# Patient Record
Sex: Female | Born: 1945 | Hispanic: No | Smoking: Never smoker
Health system: Southern US, Community
[De-identification: ages and names within clinical notes are randomized; demographics above are authoritative.]

## PROBLEM LIST (undated history)

## (undated) DIAGNOSIS — C801 Malignant (primary) neoplasm, unspecified: Secondary | ICD-10-CM

## (undated) DIAGNOSIS — IMO0001 Reserved for inherently not codable concepts without codable children: Secondary | ICD-10-CM

## (undated) DIAGNOSIS — N289 Disorder of kidney and ureter, unspecified: Secondary | ICD-10-CM

## (undated) DIAGNOSIS — F329 Major depressive disorder, single episode, unspecified: Secondary | ICD-10-CM

## (undated) DIAGNOSIS — B1920 Unspecified viral hepatitis C without hepatic coma: Secondary | ICD-10-CM

## (undated) DIAGNOSIS — IMO0002 Reserved for concepts with insufficient information to code with codable children: Secondary | ICD-10-CM

## (undated) DIAGNOSIS — C229 Malignant neoplasm of liver, not specified as primary or secondary: Secondary | ICD-10-CM

## (undated) DIAGNOSIS — K7460 Unspecified cirrhosis of liver: Secondary | ICD-10-CM

## (undated) DIAGNOSIS — Z531 Procedure and treatment not carried out because of patient's decision for reasons of belief and group pressure: Secondary | ICD-10-CM

## (undated) DIAGNOSIS — I10 Essential (primary) hypertension: Secondary | ICD-10-CM

## (undated) DIAGNOSIS — M1990 Unspecified osteoarthritis, unspecified site: Secondary | ICD-10-CM

## (undated) HISTORY — PX: ABDOMINAL HYSTERECTOMY: SHX81

## (undated) HISTORY — DX: Major depressive disorder, single episode, unspecified: F32.9

## (undated) HISTORY — PX: TONSILECTOMY/ADENOIDECTOMY WITH MYRINGOTOMY: SHX6125

## (undated) HISTORY — DX: Disorder of kidney and ureter, unspecified: N28.9

## (undated) HISTORY — DX: Unspecified osteoarthritis, unspecified site: M19.90

## (undated) HISTORY — PX: KNEE ARTHROSCOPY: SUR90

## (undated) HISTORY — PX: KNEE SURGERY: SHX244

## (undated) HISTORY — PX: PARTIAL HYSTERECTOMY: SHX80

## (undated) HISTORY — DX: Unspecified cirrhosis of liver: K74.60

---

## 2006-05-26 ENCOUNTER — Encounter: Admission: RE | Admit: 2006-05-26 | Discharge: 2006-05-26 | Payer: Self-pay | Admitting: Cardiology

## 2006-05-31 ENCOUNTER — Encounter (HOSPITAL_COMMUNITY): Admission: RE | Admit: 2006-05-31 | Discharge: 2006-08-25 | Payer: Self-pay | Admitting: Cardiology

## 2006-09-15 ENCOUNTER — Encounter: Admission: RE | Admit: 2006-09-15 | Discharge: 2006-09-15 | Payer: Self-pay | Admitting: Cardiology

## 2012-01-22 DIAGNOSIS — R209 Unspecified disturbances of skin sensation: Secondary | ICD-10-CM | POA: Diagnosis not present

## 2012-01-22 DIAGNOSIS — D2330 Other benign neoplasm of skin of unspecified part of face: Secondary | ICD-10-CM | POA: Diagnosis not present

## 2012-02-04 DIAGNOSIS — L0291 Cutaneous abscess, unspecified: Secondary | ICD-10-CM | POA: Diagnosis not present

## 2012-02-04 DIAGNOSIS — L0390 Cellulitis, unspecified: Secondary | ICD-10-CM | POA: Diagnosis not present

## 2012-02-23 DIAGNOSIS — H269 Unspecified cataract: Secondary | ICD-10-CM | POA: Diagnosis not present

## 2012-02-23 DIAGNOSIS — I10 Essential (primary) hypertension: Secondary | ICD-10-CM | POA: Diagnosis not present

## 2012-03-08 DIAGNOSIS — F321 Major depressive disorder, single episode, moderate: Secondary | ICD-10-CM | POA: Diagnosis not present

## 2012-05-09 DIAGNOSIS — M810 Age-related osteoporosis without current pathological fracture: Secondary | ICD-10-CM | POA: Diagnosis not present

## 2012-05-09 DIAGNOSIS — I10 Essential (primary) hypertension: Secondary | ICD-10-CM | POA: Diagnosis not present

## 2012-05-09 DIAGNOSIS — A65 Nonvenereal syphilis: Secondary | ICD-10-CM | POA: Diagnosis not present

## 2012-05-12 DIAGNOSIS — K769 Liver disease, unspecified: Secondary | ICD-10-CM | POA: Diagnosis not present

## 2012-05-12 DIAGNOSIS — D649 Anemia, unspecified: Secondary | ICD-10-CM | POA: Diagnosis not present

## 2012-05-12 DIAGNOSIS — I10 Essential (primary) hypertension: Secondary | ICD-10-CM | POA: Diagnosis not present

## 2012-05-12 DIAGNOSIS — D689 Coagulation defect, unspecified: Secondary | ICD-10-CM | POA: Diagnosis not present

## 2012-05-19 DIAGNOSIS — B351 Tinea unguium: Secondary | ICD-10-CM | POA: Diagnosis not present

## 2012-05-19 DIAGNOSIS — L03039 Cellulitis of unspecified toe: Secondary | ICD-10-CM | POA: Diagnosis not present

## 2012-05-19 DIAGNOSIS — L608 Other nail disorders: Secondary | ICD-10-CM | POA: Diagnosis not present

## 2012-07-01 DIAGNOSIS — J029 Acute pharyngitis, unspecified: Secondary | ICD-10-CM | POA: Diagnosis not present

## 2012-07-22 DIAGNOSIS — K759 Inflammatory liver disease, unspecified: Secondary | ICD-10-CM | POA: Diagnosis not present

## 2012-07-22 DIAGNOSIS — K279 Peptic ulcer, site unspecified, unspecified as acute or chronic, without hemorrhage or perforation: Secondary | ICD-10-CM | POA: Diagnosis not present

## 2012-07-22 DIAGNOSIS — R042 Hemoptysis: Secondary | ICD-10-CM | POA: Diagnosis not present

## 2012-07-22 DIAGNOSIS — I10 Essential (primary) hypertension: Secondary | ICD-10-CM | POA: Diagnosis not present

## 2012-08-05 DIAGNOSIS — J209 Acute bronchitis, unspecified: Secondary | ICD-10-CM | POA: Diagnosis not present

## 2012-08-05 DIAGNOSIS — K279 Peptic ulcer, site unspecified, unspecified as acute or chronic, without hemorrhage or perforation: Secondary | ICD-10-CM | POA: Diagnosis not present

## 2012-08-05 DIAGNOSIS — I10 Essential (primary) hypertension: Secondary | ICD-10-CM | POA: Diagnosis not present

## 2012-08-05 DIAGNOSIS — K2970 Gastritis, unspecified, without bleeding: Secondary | ICD-10-CM | POA: Diagnosis not present

## 2012-10-06 DIAGNOSIS — I10 Essential (primary) hypertension: Secondary | ICD-10-CM | POA: Diagnosis not present

## 2012-10-06 DIAGNOSIS — B1920 Unspecified viral hepatitis C without hepatic coma: Secondary | ICD-10-CM | POA: Diagnosis not present

## 2012-10-06 DIAGNOSIS — K769 Liver disease, unspecified: Secondary | ICD-10-CM | POA: Diagnosis not present

## 2012-10-06 DIAGNOSIS — IMO0002 Reserved for concepts with insufficient information to code with codable children: Secondary | ICD-10-CM | POA: Diagnosis not present

## 2012-10-10 DIAGNOSIS — M25569 Pain in unspecified knee: Secondary | ICD-10-CM | POA: Diagnosis not present

## 2012-10-10 DIAGNOSIS — IMO0002 Reserved for concepts with insufficient information to code with codable children: Secondary | ICD-10-CM | POA: Diagnosis not present

## 2012-10-13 DIAGNOSIS — K259 Gastric ulcer, unspecified as acute or chronic, without hemorrhage or perforation: Secondary | ICD-10-CM | POA: Diagnosis not present

## 2012-10-14 DIAGNOSIS — M5030 Other cervical disc degeneration, unspecified cervical region: Secondary | ICD-10-CM | POA: Diagnosis not present

## 2012-10-14 DIAGNOSIS — M5137 Other intervertebral disc degeneration, lumbosacral region: Secondary | ICD-10-CM | POA: Diagnosis not present

## 2012-10-14 DIAGNOSIS — M9981 Other biomechanical lesions of cervical region: Secondary | ICD-10-CM | POA: Diagnosis not present

## 2012-10-14 DIAGNOSIS — M999 Biomechanical lesion, unspecified: Secondary | ICD-10-CM | POA: Diagnosis not present

## 2012-10-14 DIAGNOSIS — F321 Major depressive disorder, single episode, moderate: Secondary | ICD-10-CM | POA: Diagnosis not present

## 2012-10-14 DIAGNOSIS — M4600 Spinal enthesopathy, site unspecified: Secondary | ICD-10-CM | POA: Diagnosis not present

## 2012-10-18 DIAGNOSIS — M545 Low back pain: Secondary | ICD-10-CM | POA: Diagnosis not present

## 2012-10-26 DIAGNOSIS — I10 Essential (primary) hypertension: Secondary | ICD-10-CM | POA: Diagnosis not present

## 2012-10-26 DIAGNOSIS — K259 Gastric ulcer, unspecified as acute or chronic, without hemorrhage or perforation: Secondary | ICD-10-CM | POA: Diagnosis not present

## 2012-10-26 DIAGNOSIS — I8500 Esophageal varices without bleeding: Secondary | ICD-10-CM | POA: Diagnosis not present

## 2012-10-26 DIAGNOSIS — A048 Other specified bacterial intestinal infections: Secondary | ICD-10-CM | POA: Diagnosis not present

## 2012-10-26 DIAGNOSIS — D128 Benign neoplasm of rectum: Secondary | ICD-10-CM | POA: Diagnosis not present

## 2012-10-26 DIAGNOSIS — Z1211 Encounter for screening for malignant neoplasm of colon: Secondary | ICD-10-CM | POA: Diagnosis not present

## 2012-10-26 DIAGNOSIS — Z91013 Allergy to seafood: Secondary | ICD-10-CM | POA: Diagnosis not present

## 2012-10-26 DIAGNOSIS — D126 Benign neoplasm of colon, unspecified: Secondary | ICD-10-CM | POA: Diagnosis not present

## 2012-10-26 DIAGNOSIS — K2940 Chronic atrophic gastritis without bleeding: Secondary | ICD-10-CM | POA: Diagnosis not present

## 2012-10-26 DIAGNOSIS — K5730 Diverticulosis of large intestine without perforation or abscess without bleeding: Secondary | ICD-10-CM | POA: Diagnosis not present

## 2012-10-26 DIAGNOSIS — I868 Varicose veins of other specified sites: Secondary | ICD-10-CM | POA: Diagnosis not present

## 2012-10-26 DIAGNOSIS — L905 Scar conditions and fibrosis of skin: Secondary | ICD-10-CM | POA: Diagnosis not present

## 2012-11-02 DIAGNOSIS — M4600 Spinal enthesopathy, site unspecified: Secondary | ICD-10-CM | POA: Diagnosis not present

## 2012-11-02 DIAGNOSIS — M5030 Other cervical disc degeneration, unspecified cervical region: Secondary | ICD-10-CM | POA: Diagnosis not present

## 2012-11-02 DIAGNOSIS — M9981 Other biomechanical lesions of cervical region: Secondary | ICD-10-CM | POA: Diagnosis not present

## 2012-11-02 DIAGNOSIS — M5137 Other intervertebral disc degeneration, lumbosacral region: Secondary | ICD-10-CM | POA: Diagnosis not present

## 2012-11-02 DIAGNOSIS — M999 Biomechanical lesion, unspecified: Secondary | ICD-10-CM | POA: Diagnosis not present

## 2012-11-04 DIAGNOSIS — M999 Biomechanical lesion, unspecified: Secondary | ICD-10-CM | POA: Diagnosis not present

## 2012-11-04 DIAGNOSIS — M4600 Spinal enthesopathy, site unspecified: Secondary | ICD-10-CM | POA: Diagnosis not present

## 2012-11-04 DIAGNOSIS — M5030 Other cervical disc degeneration, unspecified cervical region: Secondary | ICD-10-CM | POA: Diagnosis not present

## 2012-11-04 DIAGNOSIS — M5137 Other intervertebral disc degeneration, lumbosacral region: Secondary | ICD-10-CM | POA: Diagnosis not present

## 2012-11-04 DIAGNOSIS — M9981 Other biomechanical lesions of cervical region: Secondary | ICD-10-CM | POA: Diagnosis not present

## 2012-11-07 DIAGNOSIS — M5030 Other cervical disc degeneration, unspecified cervical region: Secondary | ICD-10-CM | POA: Diagnosis not present

## 2012-11-07 DIAGNOSIS — M999 Biomechanical lesion, unspecified: Secondary | ICD-10-CM | POA: Diagnosis not present

## 2012-11-07 DIAGNOSIS — M5137 Other intervertebral disc degeneration, lumbosacral region: Secondary | ICD-10-CM | POA: Diagnosis not present

## 2012-11-07 DIAGNOSIS — M9981 Other biomechanical lesions of cervical region: Secondary | ICD-10-CM | POA: Diagnosis not present

## 2012-11-07 DIAGNOSIS — M4600 Spinal enthesopathy, site unspecified: Secondary | ICD-10-CM | POA: Diagnosis not present

## 2012-11-08 DIAGNOSIS — M4600 Spinal enthesopathy, site unspecified: Secondary | ICD-10-CM | POA: Diagnosis not present

## 2012-11-08 DIAGNOSIS — M5030 Other cervical disc degeneration, unspecified cervical region: Secondary | ICD-10-CM | POA: Diagnosis not present

## 2012-11-08 DIAGNOSIS — M999 Biomechanical lesion, unspecified: Secondary | ICD-10-CM | POA: Diagnosis not present

## 2012-11-08 DIAGNOSIS — M9981 Other biomechanical lesions of cervical region: Secondary | ICD-10-CM | POA: Diagnosis not present

## 2012-11-08 DIAGNOSIS — M5137 Other intervertebral disc degeneration, lumbosacral region: Secondary | ICD-10-CM | POA: Diagnosis not present

## 2012-11-18 DIAGNOSIS — M999 Biomechanical lesion, unspecified: Secondary | ICD-10-CM | POA: Diagnosis not present

## 2012-11-18 DIAGNOSIS — G544 Lumbosacral root disorders, not elsewhere classified: Secondary | ICD-10-CM | POA: Diagnosis not present

## 2012-11-18 DIAGNOSIS — M4600 Spinal enthesopathy, site unspecified: Secondary | ICD-10-CM | POA: Diagnosis not present

## 2012-11-18 DIAGNOSIS — M5137 Other intervertebral disc degeneration, lumbosacral region: Secondary | ICD-10-CM | POA: Diagnosis not present

## 2012-11-22 DIAGNOSIS — G544 Lumbosacral root disorders, not elsewhere classified: Secondary | ICD-10-CM | POA: Diagnosis not present

## 2012-11-22 DIAGNOSIS — M4600 Spinal enthesopathy, site unspecified: Secondary | ICD-10-CM | POA: Diagnosis not present

## 2012-11-22 DIAGNOSIS — M999 Biomechanical lesion, unspecified: Secondary | ICD-10-CM | POA: Diagnosis not present

## 2012-11-22 DIAGNOSIS — M5137 Other intervertebral disc degeneration, lumbosacral region: Secondary | ICD-10-CM | POA: Diagnosis not present

## 2012-11-29 DIAGNOSIS — G544 Lumbosacral root disorders, not elsewhere classified: Secondary | ICD-10-CM | POA: Diagnosis not present

## 2012-11-29 DIAGNOSIS — M5137 Other intervertebral disc degeneration, lumbosacral region: Secondary | ICD-10-CM | POA: Diagnosis not present

## 2012-11-29 DIAGNOSIS — M4600 Spinal enthesopathy, site unspecified: Secondary | ICD-10-CM | POA: Diagnosis not present

## 2012-11-29 DIAGNOSIS — M999 Biomechanical lesion, unspecified: Secondary | ICD-10-CM | POA: Diagnosis not present

## 2012-12-02 DIAGNOSIS — M5137 Other intervertebral disc degeneration, lumbosacral region: Secondary | ICD-10-CM | POA: Diagnosis not present

## 2012-12-02 DIAGNOSIS — M999 Biomechanical lesion, unspecified: Secondary | ICD-10-CM | POA: Diagnosis not present

## 2012-12-02 DIAGNOSIS — G544 Lumbosacral root disorders, not elsewhere classified: Secondary | ICD-10-CM | POA: Diagnosis not present

## 2012-12-02 DIAGNOSIS — M4600 Spinal enthesopathy, site unspecified: Secondary | ICD-10-CM | POA: Diagnosis not present

## 2013-01-10 DIAGNOSIS — G544 Lumbosacral root disorders, not elsewhere classified: Secondary | ICD-10-CM | POA: Diagnosis not present

## 2013-01-10 DIAGNOSIS — M4600 Spinal enthesopathy, site unspecified: Secondary | ICD-10-CM | POA: Diagnosis not present

## 2013-01-10 DIAGNOSIS — M5137 Other intervertebral disc degeneration, lumbosacral region: Secondary | ICD-10-CM | POA: Diagnosis not present

## 2013-01-10 DIAGNOSIS — M999 Biomechanical lesion, unspecified: Secondary | ICD-10-CM | POA: Diagnosis not present

## 2013-01-23 DIAGNOSIS — M4600 Spinal enthesopathy, site unspecified: Secondary | ICD-10-CM | POA: Diagnosis not present

## 2013-01-23 DIAGNOSIS — M5137 Other intervertebral disc degeneration, lumbosacral region: Secondary | ICD-10-CM | POA: Diagnosis not present

## 2013-01-23 DIAGNOSIS — M999 Biomechanical lesion, unspecified: Secondary | ICD-10-CM | POA: Diagnosis not present

## 2013-01-23 DIAGNOSIS — G544 Lumbosacral root disorders, not elsewhere classified: Secondary | ICD-10-CM | POA: Diagnosis not present

## 2013-01-31 DIAGNOSIS — M5137 Other intervertebral disc degeneration, lumbosacral region: Secondary | ICD-10-CM | POA: Diagnosis not present

## 2013-01-31 DIAGNOSIS — M4600 Spinal enthesopathy, site unspecified: Secondary | ICD-10-CM | POA: Diagnosis not present

## 2013-01-31 DIAGNOSIS — M999 Biomechanical lesion, unspecified: Secondary | ICD-10-CM | POA: Diagnosis not present

## 2013-01-31 DIAGNOSIS — G544 Lumbosacral root disorders, not elsewhere classified: Secondary | ICD-10-CM | POA: Diagnosis not present

## 2013-02-01 DIAGNOSIS — Z79899 Other long term (current) drug therapy: Secondary | ICD-10-CM | POA: Diagnosis not present

## 2013-02-01 DIAGNOSIS — F339 Major depressive disorder, recurrent, unspecified: Secondary | ICD-10-CM | POA: Diagnosis not present

## 2013-02-01 DIAGNOSIS — B1710 Acute hepatitis C without hepatic coma: Secondary | ICD-10-CM | POA: Diagnosis not present

## 2013-02-01 DIAGNOSIS — M25569 Pain in unspecified knee: Secondary | ICD-10-CM | POA: Diagnosis not present

## 2013-02-01 DIAGNOSIS — I10 Essential (primary) hypertension: Secondary | ICD-10-CM | POA: Diagnosis not present

## 2013-02-02 DIAGNOSIS — M546 Pain in thoracic spine: Secondary | ICD-10-CM | POA: Diagnosis not present

## 2013-02-02 DIAGNOSIS — Z79899 Other long term (current) drug therapy: Secondary | ICD-10-CM | POA: Diagnosis not present

## 2013-02-10 DIAGNOSIS — F321 Major depressive disorder, single episode, moderate: Secondary | ICD-10-CM | POA: Diagnosis not present

## 2013-02-21 DIAGNOSIS — K7460 Unspecified cirrhosis of liver: Secondary | ICD-10-CM | POA: Diagnosis not present

## 2013-02-21 DIAGNOSIS — B182 Chronic viral hepatitis C: Secondary | ICD-10-CM | POA: Diagnosis not present

## 2013-02-22 DIAGNOSIS — M999 Biomechanical lesion, unspecified: Secondary | ICD-10-CM | POA: Diagnosis not present

## 2013-02-22 DIAGNOSIS — G544 Lumbosacral root disorders, not elsewhere classified: Secondary | ICD-10-CM | POA: Diagnosis not present

## 2013-02-22 DIAGNOSIS — M5137 Other intervertebral disc degeneration, lumbosacral region: Secondary | ICD-10-CM | POA: Diagnosis not present

## 2013-02-22 DIAGNOSIS — M4600 Spinal enthesopathy, site unspecified: Secondary | ICD-10-CM | POA: Diagnosis not present

## 2013-02-23 DIAGNOSIS — B182 Chronic viral hepatitis C: Secondary | ICD-10-CM | POA: Diagnosis not present

## 2013-02-23 DIAGNOSIS — I8500 Esophageal varices without bleeding: Secondary | ICD-10-CM | POA: Diagnosis not present

## 2013-02-27 DIAGNOSIS — M5137 Other intervertebral disc degeneration, lumbosacral region: Secondary | ICD-10-CM | POA: Diagnosis not present

## 2013-02-27 DIAGNOSIS — M4600 Spinal enthesopathy, site unspecified: Secondary | ICD-10-CM | POA: Diagnosis not present

## 2013-02-27 DIAGNOSIS — M999 Biomechanical lesion, unspecified: Secondary | ICD-10-CM | POA: Diagnosis not present

## 2013-02-27 DIAGNOSIS — G544 Lumbosacral root disorders, not elsewhere classified: Secondary | ICD-10-CM | POA: Diagnosis not present

## 2013-03-24 DIAGNOSIS — B182 Chronic viral hepatitis C: Secondary | ICD-10-CM | POA: Diagnosis not present

## 2013-05-16 DIAGNOSIS — K279 Peptic ulcer, site unspecified, unspecified as acute or chronic, without hemorrhage or perforation: Secondary | ICD-10-CM | POA: Diagnosis not present

## 2013-05-16 DIAGNOSIS — R1013 Epigastric pain: Secondary | ICD-10-CM | POA: Diagnosis not present

## 2013-05-16 DIAGNOSIS — I209 Angina pectoris, unspecified: Secondary | ICD-10-CM | POA: Diagnosis not present

## 2013-05-16 DIAGNOSIS — I10 Essential (primary) hypertension: Secondary | ICD-10-CM | POA: Diagnosis not present

## 2013-05-16 DIAGNOSIS — D696 Thrombocytopenia, unspecified: Secondary | ICD-10-CM | POA: Diagnosis not present

## 2013-05-16 DIAGNOSIS — K769 Liver disease, unspecified: Secondary | ICD-10-CM | POA: Diagnosis not present

## 2013-05-16 DIAGNOSIS — K2990 Gastroduodenitis, unspecified, without bleeding: Secondary | ICD-10-CM | POA: Diagnosis not present

## 2013-05-16 DIAGNOSIS — K219 Gastro-esophageal reflux disease without esophagitis: Secondary | ICD-10-CM | POA: Diagnosis not present

## 2013-05-16 DIAGNOSIS — K2970 Gastritis, unspecified, without bleeding: Secondary | ICD-10-CM | POA: Diagnosis not present

## 2013-06-15 DIAGNOSIS — R072 Precordial pain: Secondary | ICD-10-CM | POA: Diagnosis not present

## 2013-06-15 DIAGNOSIS — I10 Essential (primary) hypertension: Secondary | ICD-10-CM | POA: Diagnosis not present

## 2013-06-15 DIAGNOSIS — F321 Major depressive disorder, single episode, moderate: Secondary | ICD-10-CM | POA: Diagnosis not present

## 2013-06-23 DIAGNOSIS — I8501 Esophageal varices with bleeding: Secondary | ICD-10-CM | POA: Diagnosis not present

## 2013-07-21 DIAGNOSIS — I10 Essential (primary) hypertension: Secondary | ICD-10-CM | POA: Diagnosis not present

## 2013-08-25 DIAGNOSIS — M999 Biomechanical lesion, unspecified: Secondary | ICD-10-CM | POA: Diagnosis not present

## 2013-08-25 DIAGNOSIS — M5137 Other intervertebral disc degeneration, lumbosacral region: Secondary | ICD-10-CM | POA: Diagnosis not present

## 2013-08-25 DIAGNOSIS — M5030 Other cervical disc degeneration, unspecified cervical region: Secondary | ICD-10-CM | POA: Diagnosis not present

## 2013-08-25 DIAGNOSIS — F321 Major depressive disorder, single episode, moderate: Secondary | ICD-10-CM | POA: Diagnosis not present

## 2013-08-25 DIAGNOSIS — G544 Lumbosacral root disorders, not elsewhere classified: Secondary | ICD-10-CM | POA: Diagnosis not present

## 2013-08-25 DIAGNOSIS — M4600 Spinal enthesopathy, site unspecified: Secondary | ICD-10-CM | POA: Diagnosis not present

## 2013-08-25 DIAGNOSIS — M9981 Other biomechanical lesions of cervical region: Secondary | ICD-10-CM | POA: Diagnosis not present

## 2013-09-05 DIAGNOSIS — M5030 Other cervical disc degeneration, unspecified cervical region: Secondary | ICD-10-CM | POA: Diagnosis not present

## 2013-09-05 DIAGNOSIS — M9981 Other biomechanical lesions of cervical region: Secondary | ICD-10-CM | POA: Diagnosis not present

## 2013-09-05 DIAGNOSIS — M999 Biomechanical lesion, unspecified: Secondary | ICD-10-CM | POA: Diagnosis not present

## 2013-09-05 DIAGNOSIS — M4600 Spinal enthesopathy, site unspecified: Secondary | ICD-10-CM | POA: Diagnosis not present

## 2013-09-05 DIAGNOSIS — M5137 Other intervertebral disc degeneration, lumbosacral region: Secondary | ICD-10-CM | POA: Diagnosis not present

## 2013-09-05 DIAGNOSIS — G544 Lumbosacral root disorders, not elsewhere classified: Secondary | ICD-10-CM | POA: Diagnosis not present

## 2013-09-13 DIAGNOSIS — M9981 Other biomechanical lesions of cervical region: Secondary | ICD-10-CM | POA: Diagnosis not present

## 2013-09-13 DIAGNOSIS — M999 Biomechanical lesion, unspecified: Secondary | ICD-10-CM | POA: Diagnosis not present

## 2013-09-13 DIAGNOSIS — M4600 Spinal enthesopathy, site unspecified: Secondary | ICD-10-CM | POA: Diagnosis not present

## 2013-09-13 DIAGNOSIS — M5030 Other cervical disc degeneration, unspecified cervical region: Secondary | ICD-10-CM | POA: Diagnosis not present

## 2013-09-13 DIAGNOSIS — G544 Lumbosacral root disorders, not elsewhere classified: Secondary | ICD-10-CM | POA: Diagnosis not present

## 2013-09-13 DIAGNOSIS — M5137 Other intervertebral disc degeneration, lumbosacral region: Secondary | ICD-10-CM | POA: Diagnosis not present

## 2013-10-07 DIAGNOSIS — I10 Essential (primary) hypertension: Secondary | ICD-10-CM | POA: Diagnosis not present

## 2013-10-07 DIAGNOSIS — M539 Dorsopathy, unspecified: Secondary | ICD-10-CM | POA: Diagnosis not present

## 2013-10-07 DIAGNOSIS — J019 Acute sinusitis, unspecified: Secondary | ICD-10-CM | POA: Diagnosis not present

## 2013-10-16 DIAGNOSIS — J018 Other acute sinusitis: Secondary | ICD-10-CM | POA: Diagnosis not present

## 2013-11-16 DIAGNOSIS — L98499 Non-pressure chronic ulcer of skin of other sites with unspecified severity: Secondary | ICD-10-CM | POA: Diagnosis not present

## 2013-11-16 DIAGNOSIS — J019 Acute sinusitis, unspecified: Secondary | ICD-10-CM | POA: Diagnosis not present

## 2013-11-16 DIAGNOSIS — R0789 Other chest pain: Secondary | ICD-10-CM | POA: Diagnosis not present

## 2013-11-16 DIAGNOSIS — I10 Essential (primary) hypertension: Secondary | ICD-10-CM | POA: Diagnosis not present

## 2014-01-22 DIAGNOSIS — J018 Other acute sinusitis: Secondary | ICD-10-CM | POA: Diagnosis not present

## 2014-03-07 DIAGNOSIS — M1990 Unspecified osteoarthritis, unspecified site: Secondary | ICD-10-CM | POA: Diagnosis not present

## 2014-03-07 DIAGNOSIS — R609 Edema, unspecified: Secondary | ICD-10-CM | POA: Diagnosis not present

## 2014-03-07 DIAGNOSIS — R002 Palpitations: Secondary | ICD-10-CM | POA: Diagnosis not present

## 2014-03-07 DIAGNOSIS — R5383 Other fatigue: Secondary | ICD-10-CM | POA: Diagnosis not present

## 2014-03-07 DIAGNOSIS — E785 Hyperlipidemia, unspecified: Secondary | ICD-10-CM | POA: Diagnosis not present

## 2014-03-07 DIAGNOSIS — R063 Periodic breathing: Secondary | ICD-10-CM | POA: Diagnosis not present

## 2014-03-07 DIAGNOSIS — R5381 Other malaise: Secondary | ICD-10-CM | POA: Diagnosis not present

## 2014-03-07 DIAGNOSIS — I10 Essential (primary) hypertension: Secondary | ICD-10-CM | POA: Diagnosis not present

## 2014-03-12 ENCOUNTER — Other Ambulatory Visit (HOSPITAL_COMMUNITY): Payer: Self-pay | Admitting: Cardiology

## 2014-03-12 DIAGNOSIS — R0600 Dyspnea, unspecified: Secondary | ICD-10-CM

## 2014-03-22 ENCOUNTER — Ambulatory Visit (HOSPITAL_COMMUNITY)
Admission: RE | Admit: 2014-03-22 | Discharge: 2014-03-22 | Disposition: A | Discharge status: Home / Self Care | Payer: Medicare Other | Source: Ambulatory Visit | Attending: Cardiology | Admitting: Cardiology

## 2014-03-22 DIAGNOSIS — R0609 Other forms of dyspnea: Secondary | ICD-10-CM | POA: Diagnosis not present

## 2014-03-22 DIAGNOSIS — R0989 Other specified symptoms and signs involving the circulatory and respiratory systems: Secondary | ICD-10-CM | POA: Diagnosis not present

## 2014-03-22 DIAGNOSIS — R0600 Dyspnea, unspecified: Secondary | ICD-10-CM

## 2014-03-22 NOTE — Progress Notes (Signed)
  Echocardiogram 2D Echocardiogram has been performed.  Maybee 03/22/2014, 2:37 PM

## 2014-04-03 ENCOUNTER — Emergency Department (HOSPITAL_COMMUNITY): Payer: Medicare Other

## 2014-04-03 ENCOUNTER — Emergency Department (HOSPITAL_COMMUNITY)
Admission: EM | Admit: 2014-04-03 | Discharge: 2014-04-03 | Disposition: A | Discharge status: Home / Self Care | Payer: Medicare Other | Attending: Emergency Medicine | Admitting: Emergency Medicine

## 2014-04-03 ENCOUNTER — Encounter (HOSPITAL_COMMUNITY): Payer: Self-pay | Admitting: Emergency Medicine

## 2014-04-03 DIAGNOSIS — R519 Headache, unspecified: Secondary | ICD-10-CM

## 2014-04-03 DIAGNOSIS — Z8619 Personal history of other infectious and parasitic diseases: Secondary | ICD-10-CM | POA: Insufficient documentation

## 2014-04-03 DIAGNOSIS — I10 Essential (primary) hypertension: Secondary | ICD-10-CM | POA: Diagnosis not present

## 2014-04-03 DIAGNOSIS — L98499 Non-pressure chronic ulcer of skin of other sites with unspecified severity: Secondary | ICD-10-CM | POA: Diagnosis not present

## 2014-04-03 DIAGNOSIS — R51 Headache: Secondary | ICD-10-CM | POA: Insufficient documentation

## 2014-04-03 DIAGNOSIS — Z79899 Other long term (current) drug therapy: Secondary | ICD-10-CM | POA: Insufficient documentation

## 2014-04-03 HISTORY — DX: Reserved for concepts with insufficient information to code with codable children: IMO0002

## 2014-04-03 HISTORY — DX: Unspecified viral hepatitis C without hepatic coma: B19.20

## 2014-04-03 HISTORY — DX: Essential (primary) hypertension: I10

## 2014-04-03 LAB — CBC WITH DIFFERENTIAL/PLATELET
BASOS PCT: 0 % (ref 0–1)
Basophils Absolute: 0.1 10*3/uL (ref 0.0–0.1)
EOS ABS: 0.2 10*3/uL (ref 0.0–0.7)
Eosinophils Relative: 1 % (ref 0–5)
HCT: 37.8 % (ref 36.0–46.0)
HEMOGLOBIN: 12.5 g/dL (ref 12.0–15.0)
Lymphocytes Relative: 31 % (ref 12–46)
Lymphs Abs: 4.3 10*3/uL — ABNORMAL HIGH (ref 0.7–4.0)
MCH: 28.2 pg (ref 26.0–34.0)
MCHC: 33.1 g/dL (ref 30.0–36.0)
MCV: 85.3 fL (ref 78.0–100.0)
MONOS PCT: 13 % — AB (ref 3–12)
Monocytes Absolute: 1.9 10*3/uL — ABNORMAL HIGH (ref 0.1–1.0)
Neutro Abs: 7.6 10*3/uL (ref 1.7–7.7)
Neutrophils Relative %: 54 % (ref 43–77)
PLATELETS: 105 10*3/uL — AB (ref 150–400)
RBC: 4.43 MIL/uL (ref 3.87–5.11)
RDW: 17.3 % — ABNORMAL HIGH (ref 11.5–15.5)
WBC: 14.1 10*3/uL — ABNORMAL HIGH (ref 4.0–10.5)

## 2014-04-03 LAB — I-STAT CHEM 8, ED
BUN: 10 mg/dL (ref 6–23)
Calcium, Ion: 1.2 mmol/L (ref 1.13–1.30)
Chloride: 104 mEq/L (ref 96–112)
Creatinine, Ser: 0.9 mg/dL (ref 0.50–1.10)
Glucose, Bld: 118 mg/dL — ABNORMAL HIGH (ref 70–99)
HEMATOCRIT: 41 % (ref 36.0–46.0)
HEMOGLOBIN: 13.9 g/dL (ref 12.0–15.0)
Potassium: 4.1 mEq/L (ref 3.7–5.3)
Sodium: 141 mEq/L (ref 137–147)
TCO2: 24 mmol/L (ref 0–100)

## 2014-04-03 LAB — SEDIMENTATION RATE: Sed Rate: 33 mm/hr — ABNORMAL HIGH (ref 0–22)

## 2014-04-03 MED ORDER — OXYCODONE HCL 5 MG PO TABS: 5.0000 mg | ORAL_TABLET | ORAL | Status: DC | PRN

## 2014-04-03 MED ORDER — ACETAMINOPHEN 325 MG PO TABS
650.0000 mg | ORAL_TABLET | Freq: Once | ORAL | Status: AC
  Administered 2014-04-03: 650 mg via ORAL
  Filled 2014-04-03: qty 2

## 2014-04-03 MED ORDER — CALCIUM CARBONATE ANTACID 500 MG PO CHEW
1.0000 | CHEWABLE_TABLET | Freq: Once | ORAL | Status: AC
  Administered 2014-04-03: 200 mg via ORAL
  Filled 2014-04-03: qty 1

## 2014-04-03 MED ORDER — ONDANSETRON 4 MG PO TBDP
4.0000 mg | ORAL_TABLET | Freq: Once | ORAL | Status: AC
  Administered 2014-04-03: 4 mg via ORAL
  Filled 2014-04-03: qty 1

## 2014-04-03 NOTE — ED Notes (Signed)
PT reports headache x 3 days and generalized weakness. No blurred vision or vomiting.

## 2014-04-03 NOTE — ED Provider Notes (Signed)
History is obtained from patient's daughter acting as interpreter. Patient speaks little [place]. A medical interpreter was offered to the patient but she declines Complains of bitemporal headache for approximately 3 days denies generalized weakness. Nothing makes symptoms better or worse she's been treated self with Tylenol. Headache is mild at present nonradiating dull with exacerbations of headache lasting a split second. No visual changes no other complaint. On exam she is alert Glasgow Coma Score 15 gait normal cranial nerves II through XII intact Medical decision making  lancinating lancinating characteristic of pain may be consistent with nerve palsy. Tegretol is contraindicated in this patient with hepatitis C  Orlie Dakin, MD 04/03/14 1551

## 2014-04-03 NOTE — ED Notes (Signed)
MD at bedside. 

## 2014-04-03 NOTE — ED Notes (Signed)
Patient transported to CT 

## 2014-04-03 NOTE — Discharge Instructions (Signed)

## 2014-04-03 NOTE — ED Provider Notes (Signed)
CSN: [id number]     Arrival date & time 04/03/14  1138 History   First MD Initiated Contact with Patient 04/03/14 1249     Chief Complaint  Patient presents with  . Headache     (Consider location/radiation/quality/duration/timing/severity/associated sxs/prior Treatment) HPI Comments: Patient is 68 year old female who speaks spanish and is here with her daughter who is interpreting.  She has significant PMHx for Hepatitis C and HTN.  She presents from her GI doctor with history of 3 days of intermittent sharp stabbing headache which is located at bilateral temples.  She states that the pain comes and goes with treatment with tylenol but they are concerned that she may be having a stroke.  She denies any fever, chills, visual changes, nausea, vomiting, chest pain, focal neurological deficits.  She walks with an unsteady gait with a walker at home at baseline.  Patient is a 68 y.o. female presenting with headaches. The history is provided by the patient and a relative. The history is limited by a language barrier. A language interpreter was used.  Headache Pain location:  R temporal and L temporal Quality:  Sharp Radiates to:  Does not radiate Severity currently:  8/10 Severity at highest:  10/10 Onset quality:  Sudden Duration:  3 days Timing:  Intermittent Progression:  Worsening Chronicity:  New Similar to prior headaches: no   Context: bright light   Context: not activity, not coughing, not defecating, not stress, not exposure to cold air, not intercourse, not loud noise and not straining   Relieved by:  Acetaminophen Worsened by:  Nothing tried Ineffective treatments:  None tried Associated symptoms: weakness   Associated symptoms: no back pain, no blurred vision, no cough, no dizziness, no pain, no facial pain, no focal weakness, no loss of balance, no nausea, no neck pain, no neck stiffness, no photophobia, no sinus pressure, no syncope, no visual change and no vomiting      Past Medical History  Diagnosis Date  . Ulcer   . Hepatitis C   . Hypertension    History reviewed. No pertinent past surgical history. No family history on file. History  Substance Use Topics  . Smoking status: Never Smoker   . Smokeless tobacco: Not on file  . Alcohol Use: No   OB History   Grav Para Term Preterm Abortions TAB SAB Ect Mult Living                 Review of Systems  HENT: Negative for sinus pressure.   Eyes: Negative for blurred vision, photophobia and pain.  Respiratory: Negative for cough.   Cardiovascular: Negative for syncope.  Gastrointestinal: Negative for nausea and vomiting.  Musculoskeletal: Negative for back pain, neck pain and neck stiffness.  Neurological: Positive for headaches. Negative for dizziness, focal weakness and loss of balance.  All other systems reviewed and are negative.     Allergies  Review of patient's allergies indicates no known allergies.  Home Medications   Prior to Admission medications   Medication Sig Start Date End Date Taking? Authorizing Provider  acetaminophen (TYLENOL) 500 MG tablet Take 1,000 mg by mouth every 6 (six) hours as needed for mild pain.   Yes Historical Provider, MD  benazepril (LOTENSIN) 10 MG tablet Take 10 mg by mouth 2 (two) times daily.   Yes Historical Provider, MD  pantoprazole (PROTONIX) 40 MG tablet Take 40 mg by mouth daily.   Yes Historical Provider, MD   BP 126/65  Pulse 75  Temp(Src) 98.5 F (36.9 C) (Oral)  Resp 19  SpO2 96% Physical Exam  Nursing note and vitals reviewed. Constitutional: She is oriented to person, place, and time. She appears well-developed and well-nourished. No distress.  HENT:  Head: Normocephalic and atraumatic.    Right Ear: External ear normal.  Left Ear: External ear normal.  Nose: Nose normal.  Mouth/Throat: Oropharynx is clear and moist. No oropharyngeal exudate.  Mild tenderness to palpation bilateral temples, no swelling or hardness noted,  no bruit  Eyes: Conjunctivae and EOM are normal. Pupils are equal, round, and reactive to light. No scleral icterus.  Neck: Normal range of motion. Neck supple. No rigidity.  Cardiovascular: Normal rate, regular rhythm and normal heart sounds.  Exam reveals no gallop and no friction rub.   No murmur heard. Pulmonary/Chest: Effort normal and breath sounds normal. No respiratory distress. She has no wheezes. She has no rales. She exhibits no tenderness.  Abdominal: Soft. Bowel sounds are normal. She exhibits no distension and no mass. There is no tenderness. There is no rebound and no guarding.  Musculoskeletal: Normal range of motion. She exhibits no edema and no tenderness.  Lymphadenopathy:    She has no cervical adenopathy.  Neurological: She is alert and oriented to person, place, and time. She has normal reflexes. No cranial nerve deficit. She exhibits normal muscle tone. Coordination normal.  Gait normal, no pronator drift, 5/5 strength in all muscle groups, sensation intact  Skin: Skin is warm and dry. No rash noted. No erythema. No pallor.  Psychiatric: She has a normal mood and affect. Her behavior is normal. Judgment and thought content normal.    ED Course  Procedures (including critical care time) Labs Review Labs Reviewed  CBC WITH DIFFERENTIAL - Abnormal; Notable for the following:    WBC 14.1 (*)    RDW 17.3 (*)    Platelets 105 (*)    Lymphs Abs 4.3 (*)    Monocytes Relative 13 (*)    Monocytes Absolute 1.9 (*)    All other components within normal limits  SEDIMENTATION RATE - Abnormal; Notable for the following:    Sed Rate 33 (*)    All other components within normal limits  I-STAT CHEM 8, ED - Abnormal; Notable for the following:    Glucose, Bld 118 (*)    All other components within normal limits    Imaging Review Ct Head Wo Contrast  04/03/2014   CLINICAL DATA:  Headache  EXAM: CT HEAD WITHOUT CONTRAST  TECHNIQUE: Contiguous axial images were obtained from  the base of the skull through the vertex without intravenous contrast.  COMPARISON:  None.  FINDINGS: Ventricle size is normal. Normal cerebral volume. Negative for acute infarct, hemorrhage, or mass. No acute skull abnormality.  IMPRESSION: Negative   Electronically Signed   By: Franchot Gallo M.D.   On: 04/03/2014 15:03     EKG Interpretation None      MDM   Headache  Patient here with episodic temporal headaches for the past 3 days, relieved with tylenol, CT negative and remainder of labs here are normal.  Sedrate is mildly elevated but not enough to suggest temporal arteritis.  Patient started on short course of pain medication and will follow up with PCP, Dr. Sharolyn Douglas C. Joelyn Oms, PA-C 04/03/14 1610

## 2014-04-03 NOTE — ED Provider Notes (Signed)
Medical screening examination/treatment/procedure(s) were conducted as a shared visit with non-physician practitioner(s) and myself.  I personally evaluated the patient during the encounter.   EKG Interpretation None       Orlie Dakin, MD 04/03/14 2040

## 2014-04-03 NOTE — ED Notes (Signed)
Pt refused the zofran, charted in error

## 2014-04-05 ENCOUNTER — Other Ambulatory Visit: Payer: Self-pay | Admitting: Nurse Practitioner

## 2014-04-05 DIAGNOSIS — C220 Liver cell carcinoma: Secondary | ICD-10-CM

## 2014-04-09 DIAGNOSIS — B182 Chronic viral hepatitis C: Secondary | ICD-10-CM | POA: Diagnosis not present

## 2014-04-13 ENCOUNTER — Ambulatory Visit
Admission: RE | Admit: 2014-04-13 | Discharge: 2014-04-13 | Disposition: A | Discharge status: Home / Self Care | Payer: Medicare Other | Source: Ambulatory Visit | Attending: Nurse Practitioner | Admitting: Nurse Practitioner

## 2014-04-13 DIAGNOSIS — B1920 Unspecified viral hepatitis C without hepatic coma: Secondary | ICD-10-CM | POA: Diagnosis not present

## 2014-04-13 DIAGNOSIS — C220 Liver cell carcinoma: Secondary | ICD-10-CM

## 2014-04-13 DIAGNOSIS — K7460 Unspecified cirrhosis of liver: Secondary | ICD-10-CM | POA: Diagnosis not present

## 2014-04-13 DIAGNOSIS — K8020 Calculus of gallbladder without cholecystitis without obstruction: Secondary | ICD-10-CM | POA: Diagnosis not present

## 2014-05-08 ENCOUNTER — Inpatient Hospital Stay (HOSPITAL_COMMUNITY)
Admission: EM | Admit: 2014-05-08 | Discharge: 2014-05-13 | DRG: 378 | Disposition: A | Discharge status: Home / Self Care | Payer: Medicare Other | Attending: Internal Medicine | Admitting: Internal Medicine

## 2014-05-08 ENCOUNTER — Emergency Department (HOSPITAL_COMMUNITY): Payer: Medicare Other

## 2014-05-08 ENCOUNTER — Encounter (HOSPITAL_COMMUNITY): Payer: Self-pay | Admitting: Emergency Medicine

## 2014-05-08 DIAGNOSIS — K920 Hematemesis: Secondary | ICD-10-CM | POA: Diagnosis not present

## 2014-05-08 DIAGNOSIS — I10 Essential (primary) hypertension: Secondary | ICD-10-CM

## 2014-05-08 DIAGNOSIS — B1920 Unspecified viral hepatitis C without hepatic coma: Secondary | ICD-10-CM

## 2014-05-08 DIAGNOSIS — D696 Thrombocytopenia, unspecified: Secondary | ICD-10-CM | POA: Diagnosis present

## 2014-05-08 DIAGNOSIS — R1013 Epigastric pain: Secondary | ICD-10-CM

## 2014-05-08 DIAGNOSIS — K8020 Calculus of gallbladder without cholecystitis without obstruction: Secondary | ICD-10-CM

## 2014-05-08 DIAGNOSIS — Z79899 Other long term (current) drug therapy: Secondary | ICD-10-CM | POA: Diagnosis not present

## 2014-05-08 DIAGNOSIS — R932 Abnormal findings on diagnostic imaging of liver and biliary tract: Secondary | ICD-10-CM | POA: Diagnosis not present

## 2014-05-08 DIAGNOSIS — K7689 Other specified diseases of liver: Secondary | ICD-10-CM | POA: Diagnosis present

## 2014-05-08 DIAGNOSIS — I868 Varicose veins of other specified sites: Secondary | ICD-10-CM | POA: Diagnosis not present

## 2014-05-08 DIAGNOSIS — G8929 Other chronic pain: Secondary | ICD-10-CM

## 2014-05-08 DIAGNOSIS — K922 Gastrointestinal hemorrhage, unspecified: Secondary | ICD-10-CM

## 2014-05-08 DIAGNOSIS — D684 Acquired coagulation factor deficiency: Secondary | ICD-10-CM | POA: Diagnosis present

## 2014-05-08 DIAGNOSIS — I8500 Esophageal varices without bleeding: Secondary | ICD-10-CM | POA: Diagnosis not present

## 2014-05-08 DIAGNOSIS — R7402 Elevation of levels of lactic acid dehydrogenase (LDH): Secondary | ICD-10-CM

## 2014-05-08 DIAGNOSIS — R740 Nonspecific elevation of levels of transaminase and lactic acid dehydrogenase [LDH]: Secondary | ICD-10-CM

## 2014-05-08 DIAGNOSIS — K766 Portal hypertension: Secondary | ICD-10-CM | POA: Diagnosis not present

## 2014-05-08 DIAGNOSIS — I864 Gastric varices: Secondary | ICD-10-CM

## 2014-05-08 DIAGNOSIS — R109 Unspecified abdominal pain: Secondary | ICD-10-CM | POA: Diagnosis not present

## 2014-05-08 DIAGNOSIS — K7460 Unspecified cirrhosis of liver: Secondary | ICD-10-CM | POA: Diagnosis present

## 2014-05-08 DIAGNOSIS — L98499 Non-pressure chronic ulcer of skin of other sites with unspecified severity: Secondary | ICD-10-CM

## 2014-05-08 DIAGNOSIS — IMO0002 Reserved for concepts with insufficient information to code with codable children: Secondary | ICD-10-CM

## 2014-05-08 DIAGNOSIS — R935 Abnormal findings on diagnostic imaging of other abdominal regions, including retroperitoneum: Secondary | ICD-10-CM | POA: Diagnosis not present

## 2014-05-08 DIAGNOSIS — R7401 Elevation of levels of liver transaminase levels: Secondary | ICD-10-CM | POA: Diagnosis not present

## 2014-05-08 HISTORY — DX: Reserved for inherently not codable concepts without codable children: IMO0001

## 2014-05-08 HISTORY — DX: Procedure and treatment not carried out because of patient's decision for reasons of belief and group pressure: Z53.1

## 2014-05-08 LAB — COMPREHENSIVE METABOLIC PANEL
ALT: 68 U/L — AB (ref 0–35)
AST: 138 U/L — ABNORMAL HIGH (ref 0–37)
Albumin: 2 g/dL — ABNORMAL LOW (ref 3.5–5.2)
Alkaline Phosphatase: 137 U/L — ABNORMAL HIGH (ref 39–117)
BUN: 14 mg/dL (ref 6–23)
CO2: 23 mEq/L (ref 19–32)
Calcium: 8.6 mg/dL (ref 8.4–10.5)
Chloride: 106 mEq/L (ref 96–112)
Creatinine, Ser: 0.75 mg/dL (ref 0.50–1.10)
GFR calc Af Amer: >90 mL/min (ref >90)
GFR calc non Af Amer: 84 mL/min — ABNORMAL LOW (ref >90)
Glucose, Bld: 170 mg/dL — ABNORMAL HIGH (ref 70–99)
Potassium: 3.8 mEq/L (ref 3.7–5.3)
SODIUM: 139 meq/L (ref 137–147)
TOTAL PROTEIN: 7.9 g/dL (ref 6.0–8.3)
Total Bilirubin: 1.1 mg/dL (ref 0.3–1.2)

## 2014-05-08 LAB — CBC WITH DIFFERENTIAL/PLATELET
BASOS ABS: 0 10*3/uL (ref 0.0–0.1)
Basophils Relative: 0 % (ref 0–1)
EOS PCT: 4 % (ref 0–5)
Eosinophils Absolute: 0.3 10*3/uL (ref 0.0–0.7)
HCT: 36.4 % (ref 36.0–46.0)
Hemoglobin: 12 g/dL (ref 12.0–15.0)
Lymphocytes Relative: 35 % (ref 12–46)
Lymphs Abs: 2.7 10*3/uL (ref 0.7–4.0)
MCH: 27.9 pg (ref 26.0–34.0)
MCHC: 33 g/dL (ref 30.0–36.0)
MCV: 84.7 fL (ref 78.0–100.0)
Monocytes Absolute: 1.1 10*3/uL — ABNORMAL HIGH (ref 0.1–1.0)
Monocytes Relative: 15 % — ABNORMAL HIGH (ref 3–12)
NEUTROS PCT: 46 % (ref 43–77)
Neutro Abs: 3.5 10*3/uL (ref 1.7–7.7)
PLATELETS: 66 10*3/uL — AB (ref 150–400)
RBC: 4.3 MIL/uL (ref 3.87–5.11)
RDW: 17 % — AB (ref 11.5–15.5)
Smear Review: DECREASED
WBC: 7.6 10*3/uL (ref 4.0–10.5)

## 2014-05-08 LAB — TYPE AND SCREEN
ABO/RH(D): A NEG
ANTIBODY SCREEN: NEGATIVE

## 2014-05-08 LAB — PROTIME-INR
INR: 1.51 — ABNORMAL HIGH (ref 0.00–1.49)
PROTHROMBIN TIME: 17.8 s — AB (ref 11.6–15.2)

## 2014-05-08 LAB — MRSA PCR SCREENING: MRSA by PCR: NEGATIVE

## 2014-05-08 LAB — HEMOGLOBIN AND HEMATOCRIT, BLOOD
HCT: 34.1 % — ABNORMAL LOW (ref 36.0–46.0)
HEMATOCRIT: 34 % — AB (ref 36.0–46.0)
HEMOGLOBIN: 11.2 g/dL — AB (ref 12.0–15.0)
Hemoglobin: 11.5 g/dL — ABNORMAL LOW (ref 12.0–15.0)

## 2014-05-08 LAB — ABO/RH: ABO/RH(D): A NEG

## 2014-05-08 MED ORDER — SODIUM CHLORIDE 0.9 % IJ SOLN
3.0000 mL | Freq: Two times a day (BID) | INTRAMUSCULAR | Status: DC
  Administered 2014-05-11 – 2014-05-13 (×4): 3 mL via INTRAVENOUS

## 2014-05-08 MED ORDER — HYDRALAZINE HCL 20 MG/ML IJ SOLN: 10.0000 mg | Freq: Four times a day (QID) | INTRAMUSCULAR | Status: DC | PRN

## 2014-05-08 MED ORDER — OCTREOTIDE ACETATE 50 MCG/ML IJ SOLN
50.0000 ug | Freq: Once | INTRAMUSCULAR | Status: AC
  Administered 2014-05-08: 50 ug via SUBCUTANEOUS
  Filled 2014-05-08: qty 1

## 2014-05-08 MED ORDER — ONDANSETRON HCL 4 MG/2ML IJ SOLN
4.0000 mg | Freq: Once | INTRAMUSCULAR | Status: AC
  Administered 2014-05-08: 4 mg via INTRAVENOUS
  Filled 2014-05-08: qty 2

## 2014-05-08 MED ORDER — HYDROCODONE-ACETAMINOPHEN 5-325 MG PO TABS
1.0000 | ORAL_TABLET | ORAL | Status: DC | PRN
  Filled 2014-05-08: qty 1

## 2014-05-08 MED ORDER — PANTOPRAZOLE SODIUM 40 MG IV SOLR
40.0000 mg | Freq: Two times a day (BID) | INTRAVENOUS | Status: DC
  Administered 2014-05-12 – 2014-05-13 (×4): 40 mg via INTRAVENOUS
  Filled 2014-05-08 (×6): qty 40

## 2014-05-08 MED ORDER — MORPHINE SULFATE 4 MG/ML IJ SOLN
4.0000 mg | Freq: Once | INTRAMUSCULAR | Status: AC
  Administered 2014-05-08: 4 mg via INTRAVENOUS
  Filled 2014-05-08: qty 1

## 2014-05-08 MED ORDER — GUAIFENESIN-DM 100-10 MG/5ML PO SYRP
5.0000 mL | ORAL_SOLUTION | ORAL | Status: DC | PRN
  Filled 2014-05-08: qty 5

## 2014-05-08 MED ORDER — PANTOPRAZOLE SODIUM 40 MG IV SOLR
8.0000 mg/h | INTRAVENOUS | Status: DC
  Administered 2014-05-08 – 2014-05-09 (×2): 8 mg/h via INTRAVENOUS
  Filled 2014-05-08 (×5): qty 80

## 2014-05-08 MED ORDER — DEXTROSE 5 % IV SOLN
1.0000 g | Freq: Once | INTRAVENOUS | Status: AC
  Administered 2014-05-08: 1 g via INTRAVENOUS
  Filled 2014-05-08: qty 10

## 2014-05-08 MED ORDER — SODIUM CHLORIDE 0.9 % IV BOLUS (SEPSIS)
1000.0000 mL | Freq: Once | INTRAVENOUS | Status: AC
  Administered 2014-05-08: 1000 mL via INTRAVENOUS

## 2014-05-08 MED ORDER — ONDANSETRON HCL 4 MG PO TABS: 4.0000 mg | ORAL_TABLET | Freq: Four times a day (QID) | ORAL | Status: DC | PRN

## 2014-05-08 MED ORDER — SODIUM CHLORIDE 0.9 % IV SOLN
25.0000 ug/h | INTRAVENOUS | Status: DC
  Administered 2014-05-08: 25 ug/h via INTRAVENOUS
  Filled 2014-05-08 (×5): qty 1

## 2014-05-08 MED ORDER — MORPHINE SULFATE 2 MG/ML IJ SOLN
2.0000 mg | Freq: Once | INTRAMUSCULAR | Status: AC
  Administered 2014-05-08: 2 mg via INTRAVENOUS
  Filled 2014-05-08: qty 1

## 2014-05-08 MED ORDER — SODIUM CHLORIDE 0.9 % IV SOLN: INTRAVENOUS | Status: DC

## 2014-05-08 MED ORDER — SODIUM CHLORIDE 0.9 % IJ SOLN
3.0000 mL | INTRAMUSCULAR | Status: DC | PRN
  Administered 2014-05-09 – 2014-05-12 (×2): 3 mL via INTRAVENOUS

## 2014-05-08 MED ORDER — SODIUM CHLORIDE 0.9 % IJ SOLN
3.0000 mL | Freq: Two times a day (BID) | INTRAMUSCULAR | Status: DC
  Administered 2014-05-09 – 2014-05-13 (×8): 3 mL via INTRAVENOUS

## 2014-05-08 MED ORDER — SODIUM CHLORIDE 0.9 % IV SOLN: 250.0000 mL | INTRAVENOUS | Status: DC | PRN

## 2014-05-08 MED ORDER — SODIUM CHLORIDE 0.9 % IV SOLN
INTRAVENOUS | Status: AC
  Administered 2014-05-08: 16:00:00 via INTRAVENOUS

## 2014-05-08 MED ORDER — PANTOPRAZOLE SODIUM 40 MG IV SOLR
40.0000 mg | Freq: Once | INTRAVENOUS | Status: AC
  Administered 2014-05-08: 40 mg via INTRAVENOUS
  Filled 2014-05-08: qty 40

## 2014-05-08 MED ORDER — ONDANSETRON HCL 4 MG/2ML IJ SOLN
4.0000 mg | Freq: Four times a day (QID) | INTRAMUSCULAR | Status: DC | PRN
  Administered 2014-05-08 – 2014-05-10 (×2): 4 mg via INTRAVENOUS
  Filled 2014-05-08 (×2): qty 2

## 2014-05-08 MED ORDER — PNEUMOCOCCAL VAC POLYVALENT 25 MCG/0.5ML IJ INJ
0.5000 mL | INJECTION | INTRAMUSCULAR | Status: AC
  Administered 2014-05-09: 0.5 mL via INTRAMUSCULAR
  Filled 2014-05-08: qty 0.5

## 2014-05-08 NOTE — ED Provider Notes (Signed)
CSN: [id number]     Arrival date & time 05/08/14  1144 History   First MD Initiated Contact with Patient 05/08/14 1211     Chief Complaint  Patient presents with  . Hematemesis     (Consider location/radiation/quality/duration/timing/severity/associated sxs/prior Treatment) HPI  This is a 68 year old female with history of hepatitis C, esophageal varices, and hypertension who presents with abdominal pain and hematemesis. Patient's son is at the bedside and is translating. Patient is from [place].  Patient states that over the last 2-3 days she's had increasing sharp epigastric pain that radiates into her back. She also reports vomiting up blood. She reports vomiting up enough to fill her hand. She denies any bloody stools or dark stools. She reports a history of the same in the past and produced documentation of an EGD report from [place] that showed esophageal varices.  This was in 2013. Patient states her symptoms were similar at that time. Currently her pain is 6/10.  Past Medical History  Diagnosis Date  . Ulcer   . Hepatitis C   . Hypertension   . Refusal of blood transfusions as patient is Jehovah's Witness    Past Surgical History  Procedure Laterality Date  . Partial hysterectomy    . Knee arthroscopy Left    History reviewed. No pertinent family history. History  Substance Use Topics  . Smoking status: Never Smoker   . Smokeless tobacco: Never Used  . Alcohol Use: No   OB History   Grav Para Term Preterm Abortions TAB SAB Ect Mult Living                 Review of Systems  Constitutional: Negative for fever.  Respiratory: Negative for cough, chest tightness and shortness of breath.   Cardiovascular: Negative for chest pain.  Gastrointestinal: Positive for nausea, vomiting and abdominal pain. Negative for diarrhea.       Hematemesis  Genitourinary: Negative for dysuria.  Musculoskeletal: Negative for back pain.  Skin: Negative for rash.  Neurological:  Negative for dizziness and headaches.  Psychiatric/Behavioral: Negative for confusion.  All other systems reviewed and are negative.     Allergies  Review of patient's allergies indicates no known allergies.  Home Medications   Prior to Admission medications   Medication Sig Start Date End Date Taking? Authorizing Provider  acetaminophen (TYLENOL) 500 MG tablet Take 1,000 mg by mouth every 6 (six) hours as needed for mild pain.    Historical Provider, MD  benazepril (LOTENSIN) 10 MG tablet Take 10 mg by mouth 2 (two) times daily.    Historical Provider, MD  oxyCODONE (OXY IR/ROXICODONE) 5 MG immediate release tablet Take 1 tablet (5 mg total) by mouth every 4 (four) hours as needed for severe pain. 04/03/14   Idalia Needle. Sanford, PA-C  pantoprazole (PROTONIX) 40 MG tablet Take 40 mg by mouth daily.    Historical Provider, MD   BP 122/69  Pulse 72  Temp(Src) 98.3 F (36.8 C) (Oral)  Resp 15  Ht 5\' 2"  (1.575 m)  Wt 166 lb 3.6 oz (75.4 kg)  BMI 30.40 kg/m2  SpO2 99% Physical Exam  Nursing note and vitals reviewed. Constitutional: She is oriented to person, place, and time. She appears well-developed and well-nourished. No distress.  HENT:  Head: Normocephalic and atraumatic.  Eyes: Pupils are equal, round, and reactive to light.  Neck: Neck supple.  Cardiovascular: Normal rate, regular rhythm and normal heart sounds.   No murmur heard. Pulmonary/Chest: Effort normal and  breath sounds normal. No respiratory distress. She has no wheezes.  Abdominal: Soft. Bowel sounds are normal. There is tenderness. There is no rebound and no guarding.  Epigastric tenderness to palpation, abdomen soft  Musculoskeletal: She exhibits no edema.  Neurological: She is alert and oriented to person, place, and time.  Skin: Skin is warm and dry.  Psychiatric: She has a normal mood and affect.    ED Course  Procedures (including critical care time) Labs Review Labs Reviewed  CBC WITH DIFFERENTIAL -  Abnormal; Notable for the following:    RDW 17.0 (*)    Platelets 66 (*)    Monocytes Relative 15 (*)    Monocytes Absolute 1.1 (*)    All other components within normal limits  COMPREHENSIVE METABOLIC PANEL - Abnormal; Notable for the following:    Glucose, Bld 170 (*)    Albumin 2.0 (*)    AST 138 (*)    ALT 68 (*)    Alkaline Phosphatase 137 (*)    GFR calc non Af Amer 84 (*)    All other components within normal limits  HEMOGLOBIN AND HEMATOCRIT, BLOOD - Abnormal; Notable for the following:    Hemoglobin 11.5 (*)    HCT 34.0 (*)    All other components within normal limits  PROTIME-INR - Abnormal; Notable for the following:    Prothrombin Time 17.8 (*)    INR 1.51 (*)    All other components within normal limits  MRSA PCR SCREENING  HEMOGLOBIN AND HEMATOCRIT, BLOOD  HEMOGLOBIN AND HEMATOCRIT, BLOOD  BASIC METABOLIC PANEL  CBC  TYPE AND SCREEN  ABO/RH    Imaging Review Dg Chest Portable 1 View  05/08/2014   CLINICAL DATA:  HEMATEMESIS  EXAM: PORTABLE CHEST - 1 VIEW  COMPARISON:  None available  FINDINGS: Diffuse interstitial edema or infiltrates bilaterally. Heart size normal. Atheromatous aorta. . No effusion. Degenerative changes in bilateral shoulders.  IMPRESSION: Mild diffuse bilateral edema or interstitial infiltrates.   Electronically Signed   By: Arne Cleveland M.D.   On: 05/08/2014 13:04     EKG Interpretation None      MDM   Final diagnoses:  Hepatitis C  Upper GI Bleed  Esophageal varices  Abdominal pain, chronic, epigastric    Patient presents with abdominal pain and hematemesis.  Hx of esophageal varices.  VS stable.  Labs sent including type and screen.  Patient started on protonix and octreotide. Also given rocephin.  No recurrent bleeding in ED.  [facility] consulted.  Patient to be admitted to the stepdown unit for further management.    Merryl Hacker, MD 05/08/14 [id number]

## 2014-05-08 NOTE — H&P (Signed)
Patient Demographics  Heidi Booker, is a 68 y.o. female  MRN: [id number]   DOB - [birth year]  Admit Date - 05/08/2014  Outpatient Primary MD for the patient is No PCP Per Patient   With History of -  Past Medical History  Diagnosis Date  . Ulcer   . Hepatitis C   . Hypertension       History reviewed. No pertinent past surgical history.  in for   Chief Complaint  Patient presents with  . Hematemesis     HPI  Heidi Booker  is a 69 y.o. female, with history of hypertension, gastric ulcer, hepatitis C with cirrhosis and  Esophageal varices who follows with  [facility] hepatology clinic and speaks limited [place], earlier son was here and was translating but currently she has left the room, comes to the ER with 15-34 hour history of epigastric abdominal pain which at times radiates to her back, associated with hematemesis, she claims that she has thrown up about half a cup of blood so far, no blood in stool or black colored stool, denies any chest pain or palpitations, no headache, no fever chills, no focal weakness or any other subjective complaint. In the ER she was hematologically stable with stable hemoglobin hematocrit, her case was discussed with [facility] who requested that patient be started on IV Protonix and octreotide and he admitted by hospitalist.    Review of Systems    In addition to the HPI above,  No Fever-chills, No Headache, No changes with Vision or hearing, No problems swallowing food or Liquids, No Chest pain, Cough or Shortness of Breath, Epigastric abdominal pain with a few episodes of hematemesis,  no bloody or black colored stool, No Blood in stool or Urine, No dysuria, No new skin rashes or bruises, No new joints pains-aches,  No new weakness, tingling, numbness in any  extremity, No recent weight gain or loss, No polyuria, polydypsia or polyphagia, No significant Mental Stressors.  A full 10 point Review of Systems was done, except as stated above, all other Review of Systems were negative.   Social History History  Substance Use Topics  . Smoking status: Never Smoker   . Smokeless tobacco: Not on file  . Alcohol Use: No      Family History Denies any history of cirrhosis  Prior to Admission medications   Medication Sig Start Date End Date Taking? Authorizing Provider  benazepril (LOTENSIN) 10 MG tablet Take 10 mg by mouth 2 (two) times daily.   Yes Historical Provider, MD  pantoprazole (PROTONIX) 40 MG tablet Take 40 mg by mouth daily.   Yes Historical Provider, MD    No Known Allergies  Physical Exam  Vitals  Blood pressure 117/57, pulse 79, temperature 98.6 F (37 C), temperature source Oral, resp. rate 14, SpO2 98.00%.   1. General middle-aged Hispanic female lying in bed in NAD,   2. Normal affect and insight, Not Suicidal or  Homicidal, Awake Alert, Oriented X 3.  3. No F.N deficits, ALL C.Nerves Intact, Strength 5/5 all 4 extremities, Sensation intact all 4 extremities, Plantars down going.  4. Ears and Eyes appear Normal, Conjunctivae clear, PERRLA. Moist Oral Mucosa.  5. Supple Neck, No JVD, No cervical lymphadenopathy appriciated, No Carotid Bruits.  6. Symmetrical Chest wall movement, Good air movement bilaterally, CTAB.  7. RRR, No Gallops, Rubs or Murmurs, No Parasternal Heave.  8. Positive Bowel Sounds, Abdomen Soft, mild epigastric tenderness, No organomegaly appriciated,No rebound -guarding or rigidity.  9.  No Cyanosis, Normal Skin Turgor, No Skin Rash or Bruise.  10. Good muscle tone,  joints appear normal , no effusions, Normal ROM.  11. No Palpable Lymph Nodes in Neck or Axillae     Data Review  CBC  Recent Labs Lab 05/08/14 1228  WBC 7.6  HGB 12.0  HCT 36.4  PLT 66*  MCV 84.7  MCH 27.9    MCHC 33.0  RDW 17.0*  LYMPHSABS 2.7  MONOABS 1.1*  EOSABS 0.3  BASOSABS 0.0   ------------------------------------------------------------------------------------------------------------------  Chemistries   Recent Labs Lab 05/08/14 1228  NA 139  K 3.8  CL 106  CO2 23  GLUCOSE 170*  BUN 14  CREATININE 0.75  CALCIUM 8.6  AST 138*  ALT 68*  ALKPHOS 137*  BILITOT 1.1   ------------------------------------------------------------------------------------------------------------------ CrCl is unknown because there is no height on file for the current visit. ------------------------------------------------------------------------------------------------------------------ No results found for this basename: TSH, T4TOTAL, FREET3, T3FREE, THYROIDAB,  in the last 72 hours   Coagulation profile No results found for this basename: INR, PROTIME,  in the last 168 hours ------------------------------------------------------------------------------------------------------------------- No results found for this basename: DDIMER,  in the last 72 hours -------------------------------------------------------------------------------------------------------------------  Cardiac Enzymes No results found for this basename: CK, CKMB, TROPONINI, MYOGLOBIN,  in the last 168 hours ------------------------------------------------------------------------------------------------------------------ No components found with this basename: POCBNP,    ---------------------------------------------------------------------------------------------------------------  Urinalysis No results found for this basename: colorurine, appearanceur, labspec, phurine, glucoseu, hgbur, bilirubinur, ketonesur, proteinur, urobilinogen, nitrite, leukocytesur    ----------------------------------------------------------------------------------------------------------------  Imaging results:   Dg Chest Portable 1  View  05/08/2014   CLINICAL DATA:  HEMATEMESIS  EXAM: PORTABLE CHEST - 1 VIEW  COMPARISON:  None available  FINDINGS: Diffuse interstitial edema or infiltrates bilaterally. Heart size normal. Atheromatous aorta. . No effusion. Degenerative changes in bilateral shoulders.  IMPRESSION: Mild diffuse bilateral edema or interstitial infiltrates.   Electronically Signed   By: Arne Cleveland M.D.   On: 05/08/2014 13:04         Assessment & Plan   1. Upper GI bleed in a patient with history of esophageal varices, gastric ulcer and hepatitis C - case has been discussed with [facility] who will see the patient, she will be admitted to step down unit, will type screen, monitor H&H, continue on IV Protonix drip along with IV octreotide, goal will be to keep her hemoglobin around 8. Will also check a baseline INR. Will keep her n.p.o. maintain good IV access, gentle hydration.    2. History of cirrhosis/portal hypertension along with hepatitis C - currently on IV Protonix and octreotide, no signs of ascites or SBP, will defer any use of empiric antibiotic to GI, once stable low-dose beta blocker can be considered orally.    3. Hypertension. N.p.o., as needed IV hydralazine.     DVT Prophylaxis  SCDs   AM Labs Ordered, also please review Full Orders  Family Communication: Admission, patients condition and plan of care  including tests being ordered have been discussed with the patient  who indicates understanding and agree with the plan and Code Status.  Code Status full  Likely DC to  home  Condition GUARDED    Time spent in minutes : 35    Thurnell Lose M.D on 05/08/2014 at 2:41 PM  Between 7am to 7pm - Pager - [phone]  After 7pm go to [email or web address] - password [email or web address]  And look for the night coverage person covering me after hours  [facility] Office  [phone]   **Disclaimer: This note may have been dictated with voice recognition software. Similar sounding  words can inadvertently be transcribed and this note may contain transcription errors which may not have been corrected upon publication of note.**

## 2014-05-08 NOTE — Progress Notes (Signed)
Pt transferred from ED around 1615, admitted to Rm/3s06. Pt comes from home with family. She is alert and oriented. No skin breakdown noted. Placed on telemetry, currently NSR. Oriented to room, instructed to call for assistance before getting out of bed. Resting comfortably at this time, no s/s of active bleeding. C/o of mild abdominal pain (3/10), medicated prior to admission in ED.  Will continue to monitor

## 2014-05-08 NOTE — Consult Note (Signed)
Referring Provider: No ref. provider found Primary Care Physician:  No PCP Per Patient Primary Gastroenterologist:  Unassigned  Reason for Consultation:  Hematemesis; history of varices  HPI: Heidi Booker is a 68 y.o. female who is Spanish speaking only with history of hepatitis C, esophageal varices, and hypertension who presents with abdominal pain and hematemesis. Patient's son is at the bedside and is translating. Patient is from [place] and moved here about 6-8 months ago. Patient states that over the last 2-3 days she's had increasing sharp epigastric pain. She also reports vomiting up blood. She reports vomiting up enough to fill her hand. She denies any bloody stools or dark stools. She reports a history of the same in the past and produced documentation of an EGD and colonoscopy report from [place]. These were in 10/2012.  EGD showed esophageal and gastric varices.  Colonoscopy revealed diverticulosis in the ascending, descending, and sigmoid colon with one polyp removed from the ascending colon.  Patient states she had similar symptoms at the time of EGD in 2013.  She is on pantoprazole 40 mg daily at home.  PPI bolus/gtt, octreotide bolus/gtt, and rocephin have been started.  She is hemodynamically stable.  INR is pending.  Hgb is normal/stable at 12.0 grams.  LFT's mildly elevated.   Past Medical History  Diagnosis Date  . Ulcer   . Hepatitis C   . Hypertension     History reviewed. No pertinent past surgical history.  Prior to Admission medications   Medication Sig Start Date End Date Taking? Authorizing Provider  benazepril (LOTENSIN) 10 MG tablet Take 10 mg by mouth 2 (two) times daily.   Yes Historical Provider, MD  pantoprazole (PROTONIX) 40 MG tablet Take 40 mg by mouth daily.   Yes Historical Provider, MD    Current Facility-Administered Medications  Medication Dose Route Frequency Provider Last Rate Last Dose  . cefTRIAXone (ROCEPHIN) 1 g in dextrose 5 % 50  mL IVPB  1 g Intravenous Once Merryl Hacker, MD      . octreotide (SANDOSTATIN) 2 mcg/mL in sodium chloride 0.9 % 250 mL infusion  25-50 mcg/hr Intravenous Continuous Merryl Hacker, MD      . pantoprazole (PROTONIX) 80 mg in sodium chloride 0.9 % 250 mL infusion  8 mg/hr Intravenous Continuous Merryl Hacker, MD      . Derrill Memo ON 05/12/2014] pantoprazole (PROTONIX) injection 40 mg  40 mg Intravenous Q12H Merryl Hacker, MD       Current Outpatient Prescriptions  Medication Sig Dispense Refill  . benazepril (LOTENSIN) 10 MG tablet Take 10 mg by mouth 2 (two) times daily.      . pantoprazole (PROTONIX) 40 MG tablet Take 40 mg by mouth daily.        Allergies as of 05/08/2014  . (No Known Allergies)    History reviewed. No pertinent family history.  History   Social History  . Marital Status: Married    Spouse Name: N/A    Number of Children: N/A  . Years of Education: N/A   Occupational History  . Not on file.   Social History Main Topics  . Smoking status: Never Smoker   . Smokeless tobacco: Not on file  . Alcohol Use: No  . Drug Use: No  . Sexual Activity: Not on file   Other Topics Concern  . Not on file   Social History Narrative  . No narrative on file    Review of Systems: Ten point  ROS is O/W negative except as mentioned in HPI.  Physical Exam: Vital signs in last 24 hours: Temp:  [98.6 F (37 C)] 98.6 F (37 C) (05/26 1148) Pulse Rate:  [75-80] 79 (05/26 1346) Resp:  [14-18] 14 (05/26 1217) BP: (102-119)/(41-57) 117/57 mmHg (05/26 1346) SpO2:  [96 %-98 %] 98 % (05/26 1346)   General:  Alert, Well-developed, well-nourished, pleasant and cooperative in NAD Head:  Normocephalic and atraumatic. Eyes:  Sclera clear, no icterus.   Conjunctiva pink. Ears:  Normal auditory acuity. Mouth:  No deformity or lesions.   Lungs:  Clear throughout to auscultation.  No wheezes, crackles, or rhonchi.  Heart:  Regular rate and rhythm; no murmurs, clicks,  rubs, or gallops. Abdomen:  Soft, non-distended.  BS present.  Mild diffuse TTP without R/R/G.   Rectal:  Deferred  Msk:  Symmetrical without gross deformities. Pulses:  Normal pulses noted. Extremities:  Without clubbing or edema. Neurologic:  Alert and  oriented x4;  grossly normal neurologically. Skin:  Intact without significant lesions or rashes. Psych:  Alert and cooperative. Normal mood and affect.  Lab Results:  Recent Labs  05/08/14 1228  WBC 7.6  HGB 12.0  HCT 36.4  PLT 66*   BMET  Recent Labs  05/08/14 1228  NA 139  K 3.8  CL 106  CO2 23  GLUCOSE 170*  BUN 14  CREATININE 0.75  CALCIUM 8.6   LFT  Recent Labs  05/08/14 1228  PROT 7.9  ALBUMIN 2.0*  AST 138*  ALT 68*  ALKPHOS 137*  BILITOT 1.1   Studies/Results: Dg Chest Portable 1 View  05/08/2014   CLINICAL DATA:  HEMATEMESIS  EXAM: PORTABLE CHEST - 1 VIEW  COMPARISON:  None available  FINDINGS: Diffuse interstitial edema or infiltrates bilaterally. Heart size normal. Atheromatous aorta. . No effusion. Degenerative changes in bilateral shoulders.  IMPRESSION: Mild diffuse bilateral edema or interstitial infiltrates.   Electronically Signed   By: Arne Cleveland M.D.   On: 05/08/2014 13:04    IMPRESSION:  -? Hematemesis with epigastric abdominal pain:  Rule out variceal bleed vs ulcer disease, etc. -History of Hepatitis C and cirrhosis with EGD report from [place] that showed esophageal/gastric varices  PLAN: -PPI bolus/gtt and octreotide bolus/gtt have already been ordered as well as Rocephin. -EGD tomorrow, 5/27. -Monitor Hgb.   Heidi Booker  05/08/2014, 2:27 PM  Pager number [phone]  GI ATTENDING  History, laboratories, x-rays reviewed. Patient seen and examined. Agree with H&P as outlined above. Patient presents with hemodynamically stable upper GI bleed. Apparently has a history of cirrhosis secondary to hepatitis C with portal hypertension. Agree with plans for octreotide,  antibiotics, and upper endoscopy tomorrow (if she remains stable).  Docia Chuck. Geri Seminole., M.D. [facility] Division of Gastroenterology

## 2014-05-08 NOTE — ED Notes (Signed)
Per pt and family pt has been having abdominal pain and spitting up blood since yesterday.

## 2014-05-09 ENCOUNTER — Inpatient Hospital Stay (HOSPITAL_COMMUNITY): Payer: Medicare Other | Admitting: Anesthesiology

## 2014-05-09 ENCOUNTER — Inpatient Hospital Stay (HOSPITAL_COMMUNITY): Payer: Medicare Other

## 2014-05-09 ENCOUNTER — Encounter (HOSPITAL_COMMUNITY)
Admission: EM | Disposition: A | Discharge status: Home / Self Care | Payer: Self-pay | Source: Home / Self Care | Attending: Internal Medicine

## 2014-05-09 ENCOUNTER — Encounter (HOSPITAL_COMMUNITY): Payer: Self-pay | Admitting: Anesthesiology

## 2014-05-09 ENCOUNTER — Encounter (HOSPITAL_COMMUNITY): Payer: Medicare Other | Admitting: Anesthesiology

## 2014-05-09 DIAGNOSIS — R109 Unspecified abdominal pain: Secondary | ICD-10-CM | POA: Diagnosis not present

## 2014-05-09 DIAGNOSIS — B1920 Unspecified viral hepatitis C without hepatic coma: Secondary | ICD-10-CM

## 2014-05-09 DIAGNOSIS — R1013 Epigastric pain: Secondary | ICD-10-CM

## 2014-05-09 DIAGNOSIS — I10 Essential (primary) hypertension: Secondary | ICD-10-CM | POA: Diagnosis not present

## 2014-05-09 DIAGNOSIS — K766 Portal hypertension: Secondary | ICD-10-CM | POA: Diagnosis not present

## 2014-05-09 DIAGNOSIS — G8929 Other chronic pain: Secondary | ICD-10-CM

## 2014-05-09 DIAGNOSIS — I868 Varicose veins of other specified sites: Secondary | ICD-10-CM | POA: Diagnosis not present

## 2014-05-09 DIAGNOSIS — K920 Hematemesis: Secondary | ICD-10-CM | POA: Diagnosis not present

## 2014-05-09 HISTORY — PX: ESOPHAGOGASTRODUODENOSCOPY: SHX5428

## 2014-05-09 LAB — CBC
HEMATOCRIT: 34.7 % — AB (ref 36.0–46.0)
HEMOGLOBIN: 11.1 g/dL — AB (ref 12.0–15.0)
MCH: 27.5 pg (ref 26.0–34.0)
MCHC: 32 g/dL (ref 30.0–36.0)
MCV: 86.1 fL (ref 78.0–100.0)
Platelets: 64 10*3/uL — ABNORMAL LOW (ref 150–400)
RBC: 4.03 MIL/uL (ref 3.87–5.11)
RDW: 17.1 % — ABNORMAL HIGH (ref 11.5–15.5)
WBC: 6 10*3/uL (ref 4.0–10.5)

## 2014-05-09 LAB — HEMOGLOBIN AND HEMATOCRIT, BLOOD
HCT: 34.1 % — ABNORMAL LOW (ref 36.0–46.0)
HCT: 35.3 % — ABNORMAL LOW (ref 36.0–46.0)
HEMATOCRIT: 36 % (ref 36.0–46.0)
HEMATOCRIT: 34.4 % — AB (ref 36.0–46.0)
HEMOGLOBIN: 11.7 g/dL — AB (ref 12.0–15.0)
Hemoglobin: 11.2 g/dL — ABNORMAL LOW (ref 12.0–15.0)
Hemoglobin: 11.2 g/dL — ABNORMAL LOW (ref 12.0–15.0)
Hemoglobin: 11.8 g/dL — ABNORMAL LOW (ref 12.0–15.0)

## 2014-05-09 LAB — HEPATIC FUNCTION PANEL
ALT: 63 U/L — ABNORMAL HIGH (ref 0–35)
AST: 139 U/L — ABNORMAL HIGH (ref 0–37)
Albumin: 1.8 g/dL — ABNORMAL LOW (ref 3.5–5.2)
Alkaline Phosphatase: 119 U/L — ABNORMAL HIGH (ref 39–117)
BILIRUBIN INDIRECT: 1 mg/dL — AB (ref 0.3–0.9)
Bilirubin, Direct: 0.6 mg/dL — ABNORMAL HIGH (ref 0.0–0.3)
Total Bilirubin: 1.6 mg/dL — ABNORMAL HIGH (ref 0.3–1.2)
Total Protein: 7.4 g/dL (ref 6.0–8.3)

## 2014-05-09 LAB — BASIC METABOLIC PANEL
BUN: 12 mg/dL (ref 6–23)
CO2: 22 meq/L (ref 19–32)
Calcium: 7.9 mg/dL — ABNORMAL LOW (ref 8.4–10.5)
Chloride: 108 mEq/L (ref 96–112)
Creatinine, Ser: 0.72 mg/dL (ref 0.50–1.10)
GFR calc Af Amer: >90 mL/min (ref >90)
GFR, EST NON AFRICAN AMERICAN: 85 mL/min — AB (ref >90)
GLUCOSE: 105 mg/dL — AB (ref 70–99)
POTASSIUM: 4.1 meq/L (ref 3.7–5.3)
SODIUM: 138 meq/L (ref 137–147)

## 2014-05-09 LAB — LIPASE, BLOOD: LIPASE: 55 U/L (ref 11–59)

## 2014-05-09 SURGERY — EGD (ESOPHAGOGASTRODUODENOSCOPY)
Anesthesia: Monitor Anesthesia Care

## 2014-05-09 MED ORDER — MORPHINE SULFATE 2 MG/ML IJ SOLN
2.0000 mg | INTRAMUSCULAR | Status: DC | PRN
  Administered 2014-05-10: 1 mg via INTRAVENOUS
  Filled 2014-05-09: qty 1

## 2014-05-09 MED ORDER — IOHEXOL 300 MG/ML  SOLN
100.0000 mL | Freq: Once | INTRAMUSCULAR | Status: AC | PRN
  Administered 2014-05-09: 100 mL via INTRAVENOUS

## 2014-05-09 MED ORDER — IOHEXOL 300 MG/ML  SOLN
25.0000 mL | INTRAMUSCULAR | Status: AC
  Administered 2014-05-09 (×2): 25 mL via ORAL

## 2014-05-09 MED ORDER — SODIUM CHLORIDE 0.9 % IV SOLN
INTRAVENOUS | Status: DC | PRN
  Administered 2014-05-09: 12:00:00 via INTRAVENOUS

## 2014-05-09 MED ORDER — MORPHINE SULFATE 4 MG/ML IJ SOLN
INTRAMUSCULAR | Status: AC
  Administered 2014-05-09: 2 mg
  Filled 2014-05-09: qty 1

## 2014-05-09 MED ORDER — BUTAMBEN-TETRACAINE-BENZOCAINE 2-2-14 % EX AERO
INHALATION_SPRAY | CUTANEOUS | Status: DC | PRN
  Administered 2014-05-09: 1 via TOPICAL

## 2014-05-09 MED ORDER — PROPOFOL 10 MG/ML IV BOLUS
INTRAVENOUS | Status: DC | PRN
  Administered 2014-05-09: 20 mg via INTRAVENOUS
  Administered 2014-05-09: 50 mg via INTRAVENOUS

## 2014-05-09 NOTE — Op Note (Signed)
[facility] [place], [id number]   ENDOSCOPY PROCEDURE REPORT  PATIENT: Amaal, Dimartino  MR#: [id number] BIRTHDATE: 1946-12-03 , 84  yrs. old GENDER: Female ENDOSCOPIST: Eustace Quail, MD REFERRED BY:  [facility] PROCEDURE DATE:  05/09/2014 PROCEDURE:  EGD, diagnostic ASA CLASS:     Class III INDICATIONS:  upper abdominal pain.   Minor Hematemesis. MEDICATIONS: MAC sedation, administered by CRNA and See Anesthesia Report. TOPICAL ANESTHETIC: Cetacaine Spray  DESCRIPTION OF PROCEDURE: After the risks benefits and alternatives of the procedure were thoroughly explained, informed consent was obtained.  The Pentax Gastroscope [id number] endoscope was introduced through the mouth and advanced to the third portion of the duodenum. Without limitations.  The instrument was slowly withdrawn as the mucosa was fully examined.    EXAM:The esophagus was normal without obvious varices.  The stomach revealed probable proximal gastric varices (fundus) without stigmata.  The gastric mucosa was otherwise normal.  Duodenum was normal.  No active bleeding or blood in the upper gut.  Retroflexed views revealed varices.     The scope was then withdrawn from the patient and the procedure completed.  COMPLICATIONS: There were no complications. ENDOSCOPIC IMPRESSION: 1. No esophageal varices 2. Proximal gastric varices rule out splenic vein thrombosis 3. No blood or active bleeding. Otherwise normal EGD.  RECOMMENDATIONS: 1.  Continue current medications for now 2.  Contrast-enhanced CT scan of the abdomen and pelvis "evaluate abdominal pain, rule out splenic vein thrombosis" 3. Serum lipase 4. Clear liquid diet 5. Discussed with patient's son REPEAT EXAM:  eSigned:  Eustace Quail, MD 05/09/2014 1:21 PM   CC:The Patient

## 2014-05-09 NOTE — Anesthesia Postprocedure Evaluation (Signed)
  Anesthesia Post-op Note  Patient: Heidi Booker  Procedure(s) Performed: Procedure(s): ESOPHAGOGASTRODUODENOSCOPY (EGD) (N/A)  Patient Location: PACU and Endoscopy Unit  Anesthesia Type:MAC  Level of Consciousness: awake  Airway and Oxygen Therapy: Patient Spontanous Breathing  Post-op Pain: mild  Post-op Assessment: Post-op Vital signs reviewed  Post-op Vital Signs: Reviewed  Last Vitals:  Filed Vitals:   05/09/14 1343  BP: 116/58  Pulse: 66  Temp:   Resp: 20    Complications: No apparent anesthesia complications

## 2014-05-09 NOTE — Progress Notes (Signed)
     [facility] Progress Note  Subjective:  Feels ok.  No further hematemesis O/N per nursing reports.  Still with a little abdominal pain.  Objective:  Vital signs in last 24 hours: Temp:  [97.6 F (36.4 C)-98.6 F (37 C)] 97.6 F (36.4 C) (05/27 0800) Pulse Rate:  [67-84] 72 (05/27 0815) Resp:  [13-19] 19 (05/27 0815) BP: (102-139)/(41-73) 136/73 mmHg (05/27 0815) SpO2:  [92 %-100 %] 100 % (05/27 0815) Weight:  [166 lb 0.1 oz (75.3 kg)-166 lb 3.6 oz (75.4 kg)] 166 lb 0.1 oz (75.3 kg) (05/27 0552) Last BM Date: 05/08/14 General:  Alert, Well-developed, in NAD Heart:  Regular rate and rhythm; no murmurs Pulm:  CTAB.  No W/R/R. Abdomen:  Soft, non-distended. Normal bowel sounds.  Mild epigastric TTP without R/R/G.  Extremities:  Without edema. Neurologic:  Alert and  oriented x4;  grossly normal neurologically. Psych:  Alert and cooperative. Normal mood and affect.  Intake/Output from previous day: 05/26 0701 - 05/27 0700 In: 769.6 [I.V.:769.6] Out: 1000 [Urine:1000] Intake/Output this shift: Total I/O In: 57.5 [I.V.:57.5] Out: -   Lab Results:  Recent Labs  05/08/14 1228  05/08/14 2000 05/09/14 0028 05/09/14 0245  WBC 7.6  --   --   --  6.0  HGB 12.0  < > 11.2* 11.2* 11.1*  HCT 36.4  < > 34.1* 34.1* 34.7*  PLT 66*  --   --   --  64*  < > = values in this interval not displayed. BMET  Recent Labs  05/08/14 1228 05/09/14 0245  NA 139 138  K 3.8 4.1  CL 106 108  CO2 23 22  GLUCOSE 170* 105*  BUN 14 12  CREATININE 0.75 0.72  CALCIUM 8.6 7.9*   LFT  Recent Labs  05/08/14 1228  PROT 7.9  ALBUMIN 2.0*  AST 138*  ALT 68*  ALKPHOS 137*  BILITOT 1.1   PT/INR  Recent Labs  05/08/14 1432  LABPROT 17.8*  INR 1.51*   Dg Chest Portable 1 View  05/08/2014   CLINICAL DATA:  HEMATEMESIS  EXAM: PORTABLE CHEST - 1 VIEW  COMPARISON:  None available  FINDINGS: Diffuse interstitial edema or infiltrates bilaterally. Heart size normal.  Atheromatous aorta. . No effusion. Degenerative changes in bilateral shoulders.  IMPRESSION: Mild diffuse bilateral edema or interstitial infiltrates.   Electronically Signed   By: Arne Cleveland M.D.   On: 05/08/2014 13:04    Assessment / Plan: -? Hematemesis with epigastric abdominal pain: Rule out variceal bleed vs ulcer disease, etc.  -History of Hepatitis C and cirrhosis with EGD report from [place] that showed esophageal/gastric varices   -Continue PPI gtt and octreotide gtt as well as Rocephin for now.  -EGD later today.  -Monitor Hgb.    LOS: 1 day   [name]  05/09/2014, 9:20 AM  Pager number [phone]   GI ATTENDING  Interval history and data reviewed. Agree with above. Has remained clinical stable. For EGD today.The nature of the procedure, as well as the risks, benefits, and alternatives were carefully and thoroughly reviewed with the patient. Ample time for discussion and questions allowed. The patient understood, was satisfied, and agreed to proceed.  Docia Chuck. Geri Seminole., M.D. [facility] Division of Gastroenterology

## 2014-05-09 NOTE — Anesthesia Preprocedure Evaluation (Addendum)
Anesthesia Evaluation  Patient identified by MRN, date of birth, ID band Patient awake    Reviewed: Allergy & Precautions, H&P , NPO status , Patient's Chart, lab work & pertinent test results, reviewed documented beta blocker date and time   Airway       Dental   Pulmonary          Cardiovascular hypertension,     Neuro/Psych    GI/Hepatic (+) Hepatitis -, C  Endo/Other    Renal/GU      Musculoskeletal   Abdominal   Peds  Hematology  (+) anemia , JEHOVAH'S WITNESS  Anesthesia Other Findings   Reproductive/Obstetrics                          Anesthesia Physical Anesthesia Plan  ASA: III  Anesthesia Plan: MAC   Post-op Pain Management:    Induction: Intravenous  Airway Management Planned: Nasal Cannula  Additional Equipment:   Intra-op Plan:   Post-operative Plan:   Informed Consent: I have reviewed the patients History and Physical, chart, labs and discussed the procedure including the risks, benefits and alternatives for the proposed anesthesia with the patient or authorized representative who has indicated his/her understanding and acceptance.   Dental advisory given  Plan Discussed with: Anesthesiologist, Surgeon and CRNA  Anesthesia Plan Comments:         Anesthesia Quick Evaluation

## 2014-05-09 NOTE — Progress Notes (Signed)
[facility] PROGRESS NOTE  Heidi Booker OMV:[id number] DOB: 01/20/1946 DOA: 05/08/2014 PCP: No PCP Per Patient  Assessment/Plan: 1. Upper GI bleed 1. Hgb remains stable 2. GI consulted and recs reviewed 3. Pt is pending EGD today 2. Hep C cirrhosis 1. Will follow LFT's, moderately elevated 3. HTN 1. BP stable 2. Controlled 4. DVT prophylaxis 1. SCD's  Code Status: Full Family Communication: Pt in room Disposition Plan: Pending   Consultants:  GI  Procedures:  EGD pending  Antibiotics:  HPI/Subjective: No complaints. Denies abd pain  Objective: Filed Vitals:   05/09/14 0312 05/09/14 0552 05/09/14 0800 05/09/14 0815  BP: 139/73   136/73  Pulse: 67   72  Temp: 98.3 F (36.8 C)  97.6 F (36.4 C)   TempSrc: Oral  Oral   Resp: 13   19  Height:      Weight:  75.3 kg (166 lb 0.1 oz)    SpO2: 99%   100%    Intake/Output Summary (Last 24 hours) at 05/09/14 0843 Last data filed at 05/09/14 0800  Gross per 24 hour  Intake 807.09 ml  Output   1000 ml  Net -192.91 ml   Filed Weights   05/08/14 1619 05/09/14 0552  Weight: 75.4 kg (166 lb 3.6 oz) 75.3 kg (166 lb 0.1 oz)    Exam:   General:  Awake, in nad  Cardiovascular: regular, s1, s2  Respiratory: normal resp effort, no wheezing  Abdomen: soft, nondistended, nontender  Musculoskeletal: perfused, no clubbing   Data Reviewed: Basic Metabolic Panel:  Recent Labs Lab 05/08/14 1228 05/09/14 0245  NA 139 138  K 3.8 4.1  CL 106 108  CO2 23 22  GLUCOSE 170* 105*  BUN 14 12  CREATININE 0.75 0.72  CALCIUM 8.6 7.9*   Liver Function Tests:  Recent Labs Lab 05/08/14 1228  AST 138*  ALT 68*  ALKPHOS 137*  BILITOT 1.1  PROT 7.9  ALBUMIN 2.0*   No results found for this basename: LIPASE, AMYLASE,  in the last 168 hours No results found for this basename: AMMONIA,  in the last 168 hours CBC:  Recent Labs Lab 05/08/14 1228 05/08/14 1600 05/08/14 2000 05/09/14 0028  05/09/14 0245  WBC 7.6  --   --   --  6.0  NEUTROABS 3.5  --   --   --   --   HGB 12.0 11.5* 11.2* 11.2* 11.1*  HCT 36.4 34.0* 34.1* 34.1* 34.7*  MCV 84.7  --   --   --  86.1  PLT 66*  --   --   --  64*   Cardiac Enzymes: No results found for this basename: CKTOTAL, CKMB, CKMBINDEX, TROPONINI,  in the last 168 hours BNP (last 3 results) No results found for this basename: PROBNP,  in the last 8760 hours CBG: No results found for this basename: GLUCAP,  in the last 168 hours  Recent Results (from the past 240 hour(s))  MRSA PCR SCREENING     Status: None   Collection Time    05/08/14  4:28 PM      Result Value Ref Range Status   MRSA by PCR NEGATIVE  NEGATIVE Final   Comment:            The GeneXpert MRSA Assay (FDA     approved for NASAL specimens     only), is one component of a     comprehensive MRSA colonization     surveillance program. It is not  intended to diagnose MRSA     infection nor to guide or     monitor treatment for     MRSA infections.     Studies: Dg Chest Portable 1 View  05/08/2014   CLINICAL DATA:  HEMATEMESIS  EXAM: PORTABLE CHEST - 1 VIEW  COMPARISON:  None available  FINDINGS: Diffuse interstitial edema or infiltrates bilaterally. Heart size normal. Atheromatous aorta. . No effusion. Degenerative changes in bilateral shoulders.  IMPRESSION: Mild diffuse bilateral edema or interstitial infiltrates.   Electronically Signed   By: Arne Cleveland M.D.   On: 05/08/2014 13:04    Scheduled Meds: . [START ON 05/12/2014] pantoprazole (PROTONIX) IV  40 mg Intravenous Q12H  . pneumococcal 23 valent vaccine  0.5 mL Intramuscular Tomorrow-1000  . sodium chloride  3 mL Intravenous Q12H  . sodium chloride  3 mL Intravenous Q12H   Continuous Infusions: . sodium chloride 50 mL/hr at 05/08/14 1546  . sodium chloride    . octreotide (SANDOSTATIN) infusion 25 mcg/hr (05/09/14 0700)  . pantoprozole (PROTONIX) infusion 8 mg/hr (05/09/14 0700)    Principal  Problem:   Hematemesis Active Problems:   Hepatitis C   Hypertension   Ulcer   UGI bleed   Time spent: 18min   Stephen K Chiu  [facility] Pager [phone]. If 7PM-7AM, please contact night-coverage at [email or web address], password [facility] 05/09/2014, 8:43 AM  LOS: 1 day

## 2014-05-09 NOTE — Progress Notes (Signed)
UR Completed Karyss Frese Graves-Bigelow, RN,BSN [phone]  

## 2014-05-09 NOTE — Transfer of Care (Signed)
Immediate Anesthesia Transfer of Care Note  Patient: Heidi Booker  Procedure(s) Performed: Procedure(s): ESOPHAGOGASTRODUODENOSCOPY (EGD) (N/A)  Patient Location: PACU  Anesthesia Type:MAC  Level of Consciousness: awake, alert  and oriented  Airway & Oxygen Therapy: Patient Spontanous Breathing and Patient connected to nasal cannula oxygen  Post-op Assessment: Report given to PACU RN  Post vital signs: Reviewed and stable  Complications: No apparent anesthesia complications

## 2014-05-10 ENCOUNTER — Encounter (HOSPITAL_COMMUNITY): Payer: Self-pay | Admitting: Internal Medicine

## 2014-05-10 DIAGNOSIS — I8500 Esophageal varices without bleeding: Secondary | ICD-10-CM | POA: Diagnosis not present

## 2014-05-10 DIAGNOSIS — R7402 Elevation of levels of lactic acid dehydrogenase (LDH): Secondary | ICD-10-CM

## 2014-05-10 DIAGNOSIS — K766 Portal hypertension: Secondary | ICD-10-CM | POA: Diagnosis not present

## 2014-05-10 DIAGNOSIS — R740 Nonspecific elevation of levels of transaminase and lactic acid dehydrogenase [LDH]: Secondary | ICD-10-CM

## 2014-05-10 DIAGNOSIS — K8020 Calculus of gallbladder without cholecystitis without obstruction: Secondary | ICD-10-CM | POA: Diagnosis not present

## 2014-05-10 DIAGNOSIS — R1013 Epigastric pain: Secondary | ICD-10-CM | POA: Diagnosis not present

## 2014-05-10 DIAGNOSIS — I10 Essential (primary) hypertension: Secondary | ICD-10-CM | POA: Diagnosis not present

## 2014-05-10 DIAGNOSIS — R7401 Elevation of levels of liver transaminase levels: Secondary | ICD-10-CM

## 2014-05-10 DIAGNOSIS — B1920 Unspecified viral hepatitis C without hepatic coma: Secondary | ICD-10-CM | POA: Diagnosis not present

## 2014-05-10 LAB — HEMOGLOBIN AND HEMATOCRIT, BLOOD
HCT: 33.9 % — ABNORMAL LOW (ref 36.0–46.0)
HCT: 36.6 % (ref 36.0–46.0)
HCT: 37.2 % (ref 36.0–46.0)
HCT: 37.3 % (ref 36.0–46.0)
HEMATOCRIT: 37.3 % (ref 36.0–46.0)
HEMATOCRIT: 37 % (ref 36.0–46.0)
HEMOGLOBIN: 11.7 g/dL — AB (ref 12.0–15.0)
HEMOGLOBIN: 12 g/dL (ref 12.0–15.0)
HEMOGLOBIN: 12.4 g/dL (ref 12.0–15.0)
HEMOGLOBIN: 12.4 g/dL (ref 12.0–15.0)
Hemoglobin: 11.3 g/dL — ABNORMAL LOW (ref 12.0–15.0)
Hemoglobin: 11.8 g/dL — ABNORMAL LOW (ref 12.0–15.0)

## 2014-05-10 NOTE — Progress Notes (Signed)
[facility] PROGRESS NOTE  Heidi Booker EXH:[id number] DOB: May 13, 1946 DOA: 05/08/2014 PCP: No PCP Per Patient  Assessment/Plan: 1. Upper GI bleed 1. Hgb remains stable 2. GI consulted and recs reviewed 3. EGD 5/27, unremarkable findings 4. Pt is s/p unremarkable CTabd/pelvis with patent portal vein system 2. Hep C cirrhosis 1. Will follow LFT's, moderately elevated 3. HTN 1. BP stable 4. DVT prophylaxis 1. SCD's  Code Status: Full Family Communication: Pt in room Disposition Plan: Pending  Consultants:  GI  Procedures:  EGD pending  Antibiotics:  HPI/Subjective: Pt is very eager to go home. Tolerating clears but reports some mild post-prandial discomfort.  Objective: Filed Vitals:   05/09/14 1700 05/09/14 2000 05/09/14 2330 05/10/14 0345  BP: 115/82 133/56 146/84 149/57  Pulse:  74 70 76  Temp:  98.8 F (37.1 C) 99.4 F (37.4 C) 99.7 F (37.6 C)  TempSrc:  Axillary Oral Oral  Resp: 22 17 23 25   Height:      Weight:      SpO2:  95% 97% 97%    Intake/Output Summary (Last 24 hours) at 05/10/14 3810 Last data filed at 05/10/14 0300  Gross per 24 hour  Intake    345 ml  Output   1850 ml  Net  -1505 ml   Filed Weights   05/08/14 1619 05/09/14 0552  Weight: 75.4 kg (166 lb 3.6 oz) 75.3 kg (166 lb 0.1 oz)    Exam:   General:  Awake, in nad  Cardiovascular: regular, s1, s2  Respiratory: normal resp effort, no wheezing  Abdomen: soft, nondistended, nontender  Musculoskeletal: perfused, no clubbing   Data Reviewed: Basic Metabolic Panel:  Recent Labs Lab 05/08/14 1228 05/09/14 0245  NA 139 138  K 3.8 4.1  CL 106 108  CO2 23 22  GLUCOSE 170* 105*  BUN 14 12  CREATININE 0.75 0.72  CALCIUM 8.6 7.9*   Liver Function Tests:  Recent Labs Lab 05/08/14 1228 05/09/14 1452  AST 138* 139*  ALT 68* 63*  ALKPHOS 137* 119*  BILITOT 1.1 1.6*  PROT 7.9 7.4  ALBUMIN 2.0* 1.8*    Recent Labs Lab 05/09/14 1452  LIPASE 55   No  results found for this basename: AMMONIA,  in the last 168 hours CBC:  Recent Labs Lab 05/08/14 1228  05/09/14 0245  05/09/14 1430 05/09/14 2000 05/10/14 0002 05/10/14 0348 05/10/14 0700  WBC 7.6  --  6.0  --   --   --   --   --   --   NEUTROABS 3.5  --   --   --   --   --   --   --   --   HGB 12.0  < > 11.1*  < > 11.8* 11.2* 11.7* 12.4 12.0  HCT 36.4  < > 34.7*  < > 36.0 34.4* 36.6 37.3 37.0  MCV 84.7  --  86.1  --   --   --   --   --   --   PLT 66*  --  64*  --   --   --   --   --   --   < > = values in this interval not displayed. Cardiac Enzymes: No results found for this basename: CKTOTAL, CKMB, CKMBINDEX, TROPONINI,  in the last 168 hours BNP (last 3 results) No results found for this basename: PROBNP,  in the last 8760 hours CBG: No results found for this basename: GLUCAP,  in the last 168 hours  Recent Results (from the past 240 hour(s))  MRSA PCR SCREENING     Status: None   Collection Time    05/08/14  4:28 PM      Result Value Ref Range Status   MRSA by PCR NEGATIVE  NEGATIVE Final   Comment:            The GeneXpert MRSA Assay (FDA     approved for NASAL specimens     only), is one component of a     comprehensive MRSA colonization     surveillance program. It is not     intended to diagnose MRSA     infection nor to guide or     monitor treatment for     MRSA infections.     Studies: Ct Abdomen Pelvis W Contrast  05/10/2014   CLINICAL DATA:  Abdominal pain.  Assess for portal vein thrombosis.  EXAM: CT ABDOMEN AND PELVIS WITH CONTRAST  TECHNIQUE: Multidetector CT imaging of the abdomen and pelvis was performed using the standard protocol following bolus administration of intravenous contrast.  CONTRAST:  119mL OMNIPAQUE IOHEXOL 300 MG/ML  SOLN  COMPARISON:  None.  FINDINGS: Mild interstitial prominence at the lung bases could reflect minimal interstitial edema. Minimal bibasilar atelectasis is noted.  Diffuse nodularity of hepatic contour is compatible with  cirrhosis. Scattered vague hyperdensities within the liver are nonspecific. A small 9 mm nodule is noted at the hepatic hilum, of uncertain significance. No definite dominant mass is identified. The gallbladder contains two stones and is otherwise unremarkable in appearance. The spleen is within normal limits.  The portal venous system remains patent.  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 The pancreas and adrenal glands are unremarkable in appearance. A likely splenorenal shunt is noted.  The kidneys are unremarkable in appearance. There is no evidence of hydronephrosis. No renal or ureteral stones are seen. No perinephric stranding is appreciated.  No free fluid is identified. The small bowel is unremarkable in appearance. The stomach is within normal limits. No acute vascular abnormalities are seen. Mild scattered calcification is seen along the distal abdominal aorta and its branches.  The appendix is normal in caliber, without evidence for appendicitis. Relatively diffuse diverticulosis is noted along the ascending, descending and sigmoid colon, without definite evidence of diverticulitis.  The bladder is mildly distended and grossly unremarkable in appearance. The patient is status post hysterectomy. No suspicious adnexal masses are seen. The ovaries are relatively symmetric. No inguinal lymphadenopathy is seen.  No acute osseous abnormalities are identified. Vacuum phenomenon is incidentally noted at L5-S1, with disc space narrowing.  IMPRESSION: 1. Portal venous system remains patent. 2. Mild interstitial prominence at the lung bases could reflect minimal interstitial edema. Would correlate for associated symptoms. 3. Changes of hepatic cirrhosis noted. Scattered vague hyperdensities noted within the liver, nonspecific in nature. No dominant mass identified. Contrast-enhanced MRI could be considered for further evaluation as deemed clinically appropriate, when the patient is able to hold her breath  for the study. 4. Nonspecific 9 mm nodule at the hepatic hilum; this could reflect a small lymph node. 5. Cholelithiasis; gallbladder otherwise unremarkable in appearance. 6. Large splenic and gastric varices noted. Apparent splenorenal shunt seen. 7. Mild scattered calcification along the distal abdominal aorta and its branches. 8. Relatively diffuse diverticulosis along the ascending, descending and sigmoid colon, without definite evidence of diverticulitis.   Electronically Signed   By: Garald Balding M.D.   On: 05/10/2014 04:46   Dg Chest Portable  1 View  05/08/2014   CLINICAL DATA:  HEMATEMESIS  EXAM: PORTABLE CHEST - 1 VIEW  COMPARISON:  None available  FINDINGS: Diffuse interstitial edema or infiltrates bilaterally. Heart size normal. Atheromatous aorta. . No effusion. Degenerative changes in bilateral shoulders.  IMPRESSION: Mild diffuse bilateral edema or interstitial infiltrates.   Electronically Signed   By: Arne Cleveland M.D.   On: 05/08/2014 13:04    Scheduled Meds: . [START ON 05/12/2014] pantoprazole (PROTONIX) IV  40 mg Intravenous Q12H  . sodium chloride  3 mL Intravenous Q12H  . sodium chloride  3 mL Intravenous Q12H   Continuous Infusions:    Principal Problem:   Hematemesis Active Problems:   Hepatitis C   Hypertension   Ulcer   UGI bleed   Time spent: 77min   Stephen K Chiu  [facility] Pager [phone]. If 7PM-7AM, please contact night-coverage at [email or web address], password [facility] 05/10/2014, 8:22 AM  LOS: 2 days

## 2014-05-10 NOTE — Progress Notes (Signed)
[facility] Progress Note  Subjective:  Son was present for translation.  Nurse was also present at the time of my visit.  Patient complaining of recurrent diffuse abdominal pain again that just began a few minutes ago.  Is on clear liquids and no increase pain with those this AM.  Last BM two days ago.  Passing flatus.  No nausea or vomiting.  Objective:  Vital signs in last 24 hours: Temp:  [98.1 F (36.7 C)-99.7 F (37.6 C)] 99.4 F (37.4 C) (05/28 0815) Pulse Rate:  [65-85] 85 (05/28 0815) Resp:  [14-25] 21 (05/28 0815) BP: (100-149)/(50-84) 118/50 mmHg (05/28 0815) SpO2:  [91 %-100 %] 100 % (05/28 0815) Last BM Date: 05/08/14 General:  Alert, Well-developed, in NAD Heart:  Regular rate and rhythm; no murmurs Pulm:  CTAB.  No W/R/R. Abdomen:  Soft, non-distended.  BS present.  She expresses diffuse TTP, but abdomen is benign with no R/R/G.  Extremities:  Without edema. Neurologic:  Alert and  oriented x4; grossly normal neurologically. Psych:  Alert and cooperative. Normal mood and affect.  Intake/Output from previous day: 05/27 0701 - 05/28 0700 In: 402.5 [I.V.:402.5] Out: 2225 [Urine:2225] Intake/Output this shift: Total I/O In: -  Out: 200 [Urine:200]  Lab Results:  Recent Labs  05/08/14 1228  05/09/14 0245  05/10/14 0002 05/10/14 0348 05/10/14 0700  WBC 7.6  --  6.0  --   --   --   --   HGB 12.0  < > 11.1*  < > 11.7* 12.4 12.0  HCT 36.4  < > 34.7*  < > 36.6 37.3 37.0  PLT 66*  --  64*  --   --   --   --   < > = values in this interval not displayed. BMET  Recent Labs  05/08/14 1228 05/09/14 0245  NA 139 138  K 3.8 4.1  CL 106 108  CO2 23 22  GLUCOSE 170* 105*  BUN 14 12  CREATININE 0.75 0.72  CALCIUM 8.6 7.9*   LFT  Recent Labs  05/09/14 1452  PROT 7.4  ALBUMIN 1.8*  AST 139*  ALT 63*  ALKPHOS 119*  BILITOT 1.6*  BILIDIR 0.6*  IBILI 1.0*   PT/INR  Recent Labs  05/08/14 1432  LABPROT 17.8*  INR 1.51*   Ct  Abdomen Pelvis W Contrast  05/10/2014   CLINICAL DATA:  Abdominal pain.  Assess for portal vein thrombosis.  EXAM: CT ABDOMEN AND PELVIS WITH CONTRAST  TECHNIQUE: Multidetector CT imaging of the abdomen and pelvis was performed using the standard protocol following bolus administration of intravenous contrast.  CONTRAST:  134mL OMNIPAQUE IOHEXOL 300 MG/ML  SOLN  COMPARISON:  None.  FINDINGS: Mild interstitial prominence at the lung bases could reflect minimal interstitial edema. Minimal bibasilar atelectasis is noted.  Diffuse nodularity of hepatic contour is compatible with cirrhosis. Scattered vague hyperdensities within the liver are nonspecific. A small 9 mm nodule is noted at the hepatic hilum, of uncertain significance. No definite dominant mass is identified. The gallbladder contains two stones and is otherwise unremarkable in appearance. The spleen is within normal limits.  The portal venous system remains patent.  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 The pancreas and adrenal glands are unremarkable in appearance. A likely splenorenal shunt is noted.  The kidneys are unremarkable in appearance. There is no evidence of hydronephrosis. No renal or ureteral stones are seen. No perinephric stranding is appreciated.  No free fluid is identified. The  small bowel is unremarkable in appearance. The stomach is within normal limits. No acute vascular abnormalities are seen. Mild scattered calcification is seen along the distal abdominal aorta and its branches.  The appendix is normal in caliber, without evidence for appendicitis. Relatively diffuse diverticulosis is noted along the ascending, descending and sigmoid colon, without definite evidence of diverticulitis.  The bladder is mildly distended and grossly unremarkable in appearance. The patient is status post hysterectomy. No suspicious adnexal masses are seen. The ovaries are relatively symmetric. No inguinal lymphadenopathy is seen.  No acute osseous  abnormalities are identified. Vacuum phenomenon is incidentally noted at L5-S1, with disc space narrowing.  IMPRESSION: 1. Portal venous system remains patent. 2. Mild interstitial prominence at the lung bases could reflect minimal interstitial edema. Would correlate for associated symptoms. 3. Changes of hepatic cirrhosis noted. Scattered vague hyperdensities noted within the liver, nonspecific in nature. No dominant mass identified. Contrast-enhanced MRI could be considered for further evaluation as deemed clinically appropriate, when the patient is able to hold her breath for the study. 4. Nonspecific 9 mm nodule at the hepatic hilum; this could reflect a small lymph node. 5. Cholelithiasis; gallbladder otherwise unremarkable in appearance. 6. Large splenic and gastric varices noted. Apparent splenorenal shunt seen. 7. Mild scattered calcification along the distal abdominal aorta and its branches. 8. Relatively diffuse diverticulosis along the ascending, descending and sigmoid colon, without definite evidence of diverticulitis.   Electronically Signed   By: Garald Balding M.D.   On: 05/10/2014 04:46   Dg Chest Portable 1 View  05/08/2014   CLINICAL DATA:  HEMATEMESIS  EXAM: PORTABLE CHEST - 1 VIEW  COMPARISON:  None available  FINDINGS: Diffuse interstitial edema or infiltrates bilaterally. Heart size normal. Atheromatous aorta. . No effusion. Degenerative changes in bilateral shoulders.  IMPRESSION: Mild diffuse bilateral edema or interstitial infiltrates.   Electronically Signed   By: Arne Cleveland M.D.   On: 05/08/2014 13:04    Assessment / Plan: -Gastric varices (no esophageal varices) as seen on EGD 5/27:  Portal venous system is patent.  Is on BID PPI. -History of Hepatitis C and cirrhosis:  Also with mild coagulopathy and thrombocytopenia. -Liver lesions:  Will check AFP and should have MRI abdomen at some point (?inpatient vs outpatient). -Abdominal pain:  Unsure of the cause of her pain.  CT  did not show any acute issues and abdominal exam is benign.    LOS: 2 days   [name]  05/10/2014, 9:55 AM  Pager number [phone]   GI ATTENDING  Interval history and data reviewed. Patient personally seen and examined. Still with some abdominal discomfort. No GI bleeding. CT scan does not demonstrate splenic vein thrombosis. She does have gallstones. Her pain may be due to symptomatic cholelithiasis. Given her liver test abnormalities, rule out choledocholithiasis versus liver test abnormalities related to underlying cirrhosis. Plan clear liquid diet and MRCP. Discussed with patient and son Engineer, maintenance (IT)).  Docia Chuck. Geri Seminole., M.D. [facility] of Gastroenterology

## 2014-05-11 DIAGNOSIS — K8020 Calculus of gallbladder without cholecystitis without obstruction: Secondary | ICD-10-CM | POA: Diagnosis not present

## 2014-05-11 DIAGNOSIS — I10 Essential (primary) hypertension: Secondary | ICD-10-CM | POA: Diagnosis not present

## 2014-05-11 DIAGNOSIS — K766 Portal hypertension: Secondary | ICD-10-CM | POA: Diagnosis not present

## 2014-05-11 DIAGNOSIS — R1013 Epigastric pain: Secondary | ICD-10-CM | POA: Diagnosis not present

## 2014-05-11 DIAGNOSIS — K922 Gastrointestinal hemorrhage, unspecified: Secondary | ICD-10-CM | POA: Diagnosis not present

## 2014-05-11 DIAGNOSIS — B1920 Unspecified viral hepatitis C without hepatic coma: Secondary | ICD-10-CM | POA: Diagnosis not present

## 2014-05-11 LAB — AFP TUMOR MARKER: AFP TUMOR MARKER: 7.5 ng/mL (ref 0.0–8.0)

## 2014-05-11 LAB — HEMOGLOBIN AND HEMATOCRIT, BLOOD
HCT: 35 % — ABNORMAL LOW (ref 36.0–46.0)
HEMATOCRIT: 34.3 % — AB (ref 36.0–46.0)
HEMOGLOBIN: 11.5 g/dL — AB (ref 12.0–15.0)
Hemoglobin: 11.7 g/dL — ABNORMAL LOW (ref 12.0–15.0)

## 2014-05-11 MED ORDER — LOPERAMIDE HCL 2 MG PO CAPS
2.0000 mg | ORAL_CAPSULE | ORAL | Status: DC | PRN
  Administered 2014-05-11: 2 mg via ORAL
  Filled 2014-05-11 (×2): qty 1

## 2014-05-11 NOTE — Progress Notes (Signed)
[facility] PROGRESS NOTE  Briceida Rasberry KGY:[id number] DOB: March 28, 1946 DOA: 05/08/2014 PCP: No PCP Per Patient  Assessment/Plan: 1. Upper GI bleed 1. Hgb remains stable at around 12 2. GI consulted and recs noted 3. EGD 5/27, unremarkable findings 4. Pt is s/p unremarkable CTabd/pelvis with patent portal vein system 5. Transfer to floor 2. Abd pain 1. Elevated alk phos 2. For MRCP today 3. Hep C cirrhosis 1. Will follow LFT's, moderately elevated 4. HTN 1. BP stable 5. DVT prophylaxis 1. SCD's  Code Status: Full Family Communication: Pt in room Disposition Plan: Pending  Consultants:  GI  Procedures:  EGD pending  Antibiotics:  HPI/Subjective: Lower quadrant pain still. Also notes diarrhea this AM.  Objective: Filed Vitals:   05/10/14 1926 05/10/14 2025 05/10/14 2307 05/11/14 0430  BP: 108/35  109/48 148/59  Pulse: 74  77 76  Temp:  98.4 F (36.9 C) 98 F (36.7 C) 98 F (36.7 C)  TempSrc:  Oral Oral Oral  Resp: _0 Height:      Weight:    75.2 kg (165 lb 12.6 oz)  SpO2: 97%  97% 99%    Intake/Output Summary (Last 24 hours) at 05/11/14 0804 Last data filed at 05/11/14 0430  Gross per 24 hour  Intake    240 ml  Output   1650 ml  Net  -1410 ml   Filed Weights   05/08/14 1619 05/09/14 0552 05/11/14 0430  Weight: 75.4 kg (166 lb 3.6 oz) 75.3 kg (166 lb 0.1 oz) 75.2 kg (165 lb 12.6 oz)    Exam:   General:  Awake, in nad  Cardiovascular: regular, s1, s2  Respiratory: normal resp effort, no wheezing  Abdomen: soft, nondistended, tenderness over lower quadrants, worse over RLQ  Musculoskeletal: perfused, no clubbing   Data Reviewed: Basic Metabolic Panel:  Recent Labs Lab 05/08/14 1228 05/09/14 0245  NA 139 138  K 3.8 4.1  CL 106 108  CO2 23 22  GLUCOSE 170* 105*  BUN 14 12  CREATININE 0.75 0.72  CALCIUM 8.6 7.9*   Liver Function Tests:  Recent Labs Lab 05/08/14 1228 05/09/14 1452  AST 138* 139*  ALT 68* 63*   ALKPHOS 137* 119*  BILITOT 1.1 1.6*  PROT 7.9 7.4  ALBUMIN 2.0* 1.8*    Recent Labs Lab 05/09/14 1452  LIPASE 55   No results found for this basename: AMMONIA,  in the last 168 hours CBC:  Recent Labs Lab 05/08/14 1228  05/09/14 0245  05/10/14 1149 05/10/14 1615 05/10/14 2000 05/11/14 0043 05/11/14 0354  WBC 7.6  --  6.0  --   --   --   --   --   --   NEUTROABS 3.5  --   --   --   --   --   --   --   --   HGB 12.0  < > 11.1*  < > 12.4 11.3* 11.8* 11.5* 11.7*  HCT 36.4  < > 34.7*  < > 37.3 33.9* 37.2 34.3* 35.0*  MCV 84.7  --  86.1  --   --   --   --   --   --   PLT 66*  --  64*  --   --   --   --   --   --   < > = values in this interval not displayed. Cardiac Enzymes: No results found for this basename: CKTOTAL, CKMB, CKMBINDEX, TROPONINI,  in the last 168 hours  BNP (last 3 results) No results found for this basename: PROBNP,  in the last 8760 hours CBG: No results found for this basename: GLUCAP,  in the last 168 hours  Recent Results (from the past 240 hour(s))  MRSA PCR SCREENING     Status: None   Collection Time    05/08/14  4:28 PM      Result Value Ref Range Status   MRSA by PCR NEGATIVE  NEGATIVE Final   Comment:            The GeneXpert MRSA Assay (FDA     approved for NASAL specimens     only), is one component of a     comprehensive MRSA colonization     surveillance program. It is not     intended to diagnose MRSA     infection nor to guide or     monitor treatment for     MRSA infections.     Studies: Ct Abdomen Pelvis W Contrast  05/10/2014   ADDENDUM REPORT: 05/10/2014 15:38  ADDENDUM: The original report was by Dr. Dr. Dellis Filbert chain. The following addendum is by Dr. Van Clines:  I received a phone call regarding a request to specifically comment on the splenic vein in this case, with regard to patency or splenic vein thrombosis. I have reviewed the splenic vein in this regard but I have not reviewed the entire case, which was dictated  by Dr. Radene Knee.  The splenic vein appears patent. As it approaches the spleen, it collects from multiple varices including splenorenal varices.   Electronically Signed   By: Sherryl Barters M.D.   On: 05/10/2014 15:38   05/10/2014   CLINICAL DATA:  Abdominal pain.  Assess for portal vein thrombosis.  EXAM: CT ABDOMEN AND PELVIS WITH CONTRAST  TECHNIQUE: Multidetector CT imaging of the abdomen and pelvis was performed using the standard protocol following bolus administration of intravenous contrast.  CONTRAST:  188m OMNIPAQUE IOHEXOL 300 MG/ML  SOLN  COMPARISON:  None.  FINDINGS: Mild interstitial prominence at the lung bases could reflect minimal interstitial edema. Minimal bibasilar atelectasis is noted.  Diffuse nodularity of hepatic contour is compatible with cirrhosis. Scattered vague hyperdensities within the liver are nonspecific. A small 9 mm nodule is noted at the hepatic hilum, of uncertain significance. No definite dominant mass is identified. The gallbladder contains two stones and is otherwise unremarkable in appearance. The spleen is within normal limits.  The portal venous system remains patent.  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 The pancreas and adrenal glands are unremarkable in appearance. A likely splenorenal shunt is noted.  The kidneys are unremarkable in appearance. There is no evidence of hydronephrosis. No renal or ureteral stones are seen. No perinephric stranding is appreciated.  No free fluid is identified. The small bowel is unremarkable in appearance. The stomach is within normal limits. No acute vascular abnormalities are seen. Mild scattered calcification is seen along the distal abdominal aorta and its branches.  The appendix is normal in caliber, without evidence for appendicitis. Relatively diffuse diverticulosis is noted along the ascending, descending and sigmoid colon, without definite evidence of diverticulitis.  The bladder is mildly distended and grossly  unremarkable in appearance. The patient is status post hysterectomy. No suspicious adnexal masses are seen. The ovaries are relatively symmetric. No inguinal lymphadenopathy is seen.  No acute osseous abnormalities are identified. Vacuum phenomenon is incidentally noted at L5-S1, with disc space narrowing.  IMPRESSION: 1. Portal venous system remains patent.  2. Mild interstitial prominence at the lung bases could reflect minimal interstitial edema. Would correlate for associated symptoms. 3. Changes of hepatic cirrhosis noted. Scattered vague hyperdensities noted within the liver, nonspecific in nature. No dominant mass identified. Contrast-enhanced MRI could be considered for further evaluation as deemed clinically appropriate, when the patient is able to hold her breath for the study. 4. Nonspecific 9 mm nodule at the hepatic hilum; this could reflect a small lymph node. 5. Cholelithiasis; gallbladder otherwise unremarkable in appearance. 6. Large splenic and gastric varices noted. Apparent splenorenal shunt seen. 7. Mild scattered calcification along the distal abdominal aorta and its branches. 8. Relatively diffuse diverticulosis along the ascending, descending and sigmoid colon, without definite evidence of diverticulitis.  Electronically Signed: By: Garald Balding M.D. On: 05/10/2014 04:46    Scheduled Meds: . [START ON 05/12/2014] pantoprazole (PROTONIX) IV  40 mg Intravenous Q12H  . sodium chloride  3 mL Intravenous Q12H  . sodium chloride  3 mL Intravenous Q12H   Continuous Infusions:    Principal Problem:   Hematemesis Active Problems:   Hepatitis C   Hypertension   Ulcer   UGI bleed   Time spent: 67mn   Stephen K Chiu  [facility] Pager [phone] If 7PM-7AM, please contact night-coverage at [email or web address], password [email or web address]5/29/2015, 8:04 AM  LOS: 3 days

## 2014-05-11 NOTE — Progress Notes (Signed)
Report called to Denyse Amass, receiving RN on  [place]. VSS. Transferred to [id number] via wheelchair. Med surg, tele leads removed and central monitoring notified. [facility]

## 2014-05-11 NOTE — Progress Notes (Signed)
[facility] Progress Note  Subjective:  Feels ok pain.  Pain is a little better currently; actually more localized to the right side today.  Had a little bit of diarrhea this AM.  Objective:  Vital signs in last 24 hours: Temp:  [98 F (36.7 C)-100.8 F (38.2 C)] 98.4 F (36.9 C) (05/29 0823) Pulse Rate:  [71-79] 71 (05/29 0823) Resp:  [14-27] 14 (05/29 0823) BP: (108-150)/(35-79) 132/79 mmHg (05/29 0823) SpO2:  [96 %-99 %] 99 % (05/29 0823) Weight:  [165 lb 12.6 oz (75.2 kg)] 165 lb 12.6 oz (75.2 kg) (05/29 0430) Last BM Date: 05/10/14 General:  Alert, Well-developed, in NAD Heart:  Regular rate and rhythm; no murmurs Pulm:  CTAB.  No W/R/R. Abdomen:  Soft, non-distended. Normal bowel sounds.  RUQ TTP without R/R/G. Extremities:  Without edema. Neurologic:  Alert and  oriented x4;  grossly normal neurologically. Psych:  Alert and cooperative. Normal mood and affect.  Intake/Output from previous day: 05/28 0701 - 05/29 0700 In: 240 [P.O.:240] Out: 1650 [Urine:1650]  Lab Results:  Recent Labs  05/08/14 1228  05/09/14 0245  05/10/14 2000 05/11/14 0043 05/11/14 0354  WBC 7.6  --  6.0  --   --   --   --   HGB 12.0  < > 11.1*  < > 11.8* 11.5* 11.7*  HCT 36.4  < > 34.7*  < > 37.2 34.3* 35.0*  PLT 66*  --  64*  --   --   --   --   < > = values in this interval not displayed. BMET  Recent Labs  05/08/14 1228 05/09/14 0245  NA 139 138  K 3.8 4.1  CL 106 108  CO2 23 22  GLUCOSE 170* 105*  BUN 14 12  CREATININE 0.75 0.72  CALCIUM 8.6 7.9*   LFT  Recent Labs  05/09/14 1452  PROT 7.4  ALBUMIN 1.8*  AST 139*  ALT 63*  ALKPHOS 119*  BILITOT 1.6*  BILIDIR 0.6*  IBILI 1.0*   PT/INR  Recent Labs  05/08/14 1432  LABPROT 17.8*  INR 1.51*   Ct Abdomen Pelvis W Contrast  05/10/2014   ADDENDUM REPORT: 05/10/2014 15:38  ADDENDUM: The original report was by Dr. Dr. Dellis Filbert chain. The following addendum is by Dr. Van Clines:  I  received a phone call regarding a request to specifically comment on the splenic vein in this case, with regard to patency or splenic vein thrombosis. I have reviewed the splenic vein in this regard but I have not reviewed the entire case, which was dictated by Dr. Radene Knee.  The splenic vein appears patent. As it approaches the spleen, it collects from multiple varices including splenorenal varices.   Electronically Signed   By: Sherryl Barters M.D.   On: 05/10/2014 15:38   05/10/2014   CLINICAL DATA:  Abdominal pain.  Assess for portal vein thrombosis.  EXAM: CT ABDOMEN AND PELVIS WITH CONTRAST  TECHNIQUE: Multidetector CT imaging of the abdomen and pelvis was performed using the standard protocol following bolus administration of intravenous contrast.  CONTRAST:  168mL OMNIPAQUE IOHEXOL 300 MG/ML  SOLN  COMPARISON:  None.  FINDINGS: Mild interstitial prominence at the lung bases could reflect minimal interstitial edema. Minimal bibasilar atelectasis is noted.  Diffuse nodularity of hepatic contour is compatible with cirrhosis. Scattered vague hyperdensities within the liver are nonspecific. A small 9 mm nodule is noted at the hepatic hilum, of uncertain significance. No definite dominant mass is identified.  The gallbladder contains two stones and is otherwise unremarkable in appearance. The spleen is within normal limits.  The portal venous system remains patent.  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 The pancreas and adrenal glands are unremarkable in appearance. A likely splenorenal shunt is noted.  The kidneys are unremarkable in appearance. There is no evidence of hydronephrosis. No renal or ureteral stones are seen. No perinephric stranding is appreciated.  No free fluid is identified. The small bowel is unremarkable in appearance. The stomach is within normal limits. No acute vascular abnormalities are seen. Mild scattered calcification is seen along the distal abdominal aorta and its branches.  The  appendix is normal in caliber, without evidence for appendicitis. Relatively diffuse diverticulosis is noted along the ascending, descending and sigmoid colon, without definite evidence of diverticulitis.  The bladder is mildly distended and grossly unremarkable in appearance. The patient is status post hysterectomy. No suspicious adnexal masses are seen. The ovaries are relatively symmetric. No inguinal lymphadenopathy is seen.  No acute osseous abnormalities are identified. Vacuum phenomenon is incidentally noted at L5-S1, with disc space narrowing.  IMPRESSION: 1. Portal venous system remains patent. 2. Mild interstitial prominence at the lung bases could reflect minimal interstitial edema. Would correlate for associated symptoms. 3. Changes of hepatic cirrhosis noted. Scattered vague hyperdensities noted within the liver, nonspecific in nature. No dominant mass identified. Contrast-enhanced MRI could be considered for further evaluation as deemed clinically appropriate, when the patient is able to hold her breath for the study. 4. Nonspecific 9 mm nodule at the hepatic hilum; this could reflect a small lymph node. 5. Cholelithiasis; gallbladder otherwise unremarkable in appearance. 6. Large splenic and gastric varices noted. Apparent splenorenal shunt seen. 7. Mild scattered calcification along the distal abdominal aorta and its branches. 8. Relatively diffuse diverticulosis along the ascending, descending and sigmoid colon, without definite evidence of diverticulitis.  Electronically Signed: By: Garald Balding M.D. 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 Is on BID PPI.  -History of Hepatitis C and cirrhosis: Also with mild coagulopathy and thrombocytopenia.  -Liver lesions: AFP normal at 7.5.  Having MRI abdomen/MRCP today. -Abdominal pain: ? Symptomatic cholelithiasis.  Await MRCP results to R/O  CBD stones.  *Will discontinue q4 hour H&H.  Will recheck CBC in AM. *Ok to transfer out of the unit and to regular floor.   LOS: 3 days   Heidi Booker. Zehr  05/11/2014, 9:06 AM  Pager number [phone]   GI ATTENDING  Interval history and data reviewed. Patient seen and examined. Agree with note as outlined above. Stable. Await MRCP. Okay to discontinue octreotide. Okay to go to floor. Can discontinue antibiotics as well.  Docia Chuck. Geri Seminole., M.D. [facility] Division of Gastroenterology

## 2014-05-11 NOTE — Progress Notes (Signed)
Pt transferred to the unit at 1525. Pt mental status is A & O x 4. Pt oriented to room, staff, and call bell. Skin is intact. Full assessment charted in [facility]. Call bell within reach. Patient speaks Spanish and very little [place], but reports understanding English well.

## 2014-05-12 ENCOUNTER — Inpatient Hospital Stay (HOSPITAL_COMMUNITY): Payer: Medicare Other

## 2014-05-12 DIAGNOSIS — I868 Varicose veins of other specified sites: Secondary | ICD-10-CM | POA: Diagnosis not present

## 2014-05-12 DIAGNOSIS — R1013 Epigastric pain: Secondary | ICD-10-CM | POA: Diagnosis not present

## 2014-05-12 DIAGNOSIS — R932 Abnormal findings on diagnostic imaging of liver and biliary tract: Secondary | ICD-10-CM

## 2014-05-12 DIAGNOSIS — R935 Abnormal findings on diagnostic imaging of other abdominal regions, including retroperitoneum: Secondary | ICD-10-CM | POA: Diagnosis not present

## 2014-05-12 DIAGNOSIS — B1920 Unspecified viral hepatitis C without hepatic coma: Secondary | ICD-10-CM | POA: Diagnosis not present

## 2014-05-12 DIAGNOSIS — K922 Gastrointestinal hemorrhage, unspecified: Secondary | ICD-10-CM | POA: Diagnosis not present

## 2014-05-12 DIAGNOSIS — I10 Essential (primary) hypertension: Secondary | ICD-10-CM | POA: Diagnosis not present

## 2014-05-12 DIAGNOSIS — K8020 Calculus of gallbladder without cholecystitis without obstruction: Secondary | ICD-10-CM | POA: Diagnosis not present

## 2014-05-12 LAB — CBC
HCT: 38.3 % (ref 36.0–46.0)
Hemoglobin: 12.7 g/dL (ref 12.0–15.0)
MCH: 28.3 pg (ref 26.0–34.0)
MCHC: 33.2 g/dL (ref 30.0–36.0)
MCV: 85.5 fL (ref 78.0–100.0)
PLATELETS: 86 10*3/uL — AB (ref 150–400)
RBC: 4.48 MIL/uL (ref 3.87–5.11)
RDW: 17 % — AB (ref 11.5–15.5)
WBC: 8 10*3/uL (ref 4.0–10.5)

## 2014-05-12 MED ORDER — GADOBENATE DIMEGLUMINE 529 MG/ML IV SOLN
15.0000 mL | Freq: Once | INTRAVENOUS | Status: AC | PRN
  Administered 2014-05-12: 15 mL via INTRAVENOUS

## 2014-05-12 NOTE — Progress Notes (Signed)
HISTORY OF PRESENT ILLNESS:  Heidi Booker is a 68 y.o. female admitted with abdominal pain (upper) and scant hematemesis. Known cirrhotic. EGD revealed proximal gastric varices without stigmata. No esophageal varices. No other abnormalities. No mucosal lesions. Abdominal pain persisted. CT scan did not demonstrate splenic vein thrombosis, but did show gallstones. Cirrhosis with indeterminate liver lesion noted. AFP normal. MRCP ordered to rule out common bile duct stone (given abnormal LFTs-question related to liver disease or CBD stone). Cholelithiasis without choledocholithiasis. Pain has resolved. Hungry.  REVIEW OF SYSTEMS:  All non-GI ROS negative except for  Past Medical History  Diagnosis Date  . Ulcer   . Hepatitis C   . Hypertension   . Refusal of blood transfusions as patient is Jehovah's Witness     Past Surgical History  Procedure Laterality Date  . Partial hysterectomy    . Knee arthroscopy Left   . Esophagogastroduodenoscopy N/A 05/09/2014    Procedure: ESOPHAGOGASTRODUODENOSCOPY (EGD);  Surgeon: Irene Shipper, MD;  [facility] ENDOSCOPY;  Service: Endoscopy;  Laterality: N/A;    Social History Tonique Mendonca  reports that she has never smoked. She has never used smokeless tobacco. She reports that she does not drink alcohol or use illicit drugs.  family history is not on file.  No Known Allergies     PHYSICAL EXAMINATION: Vital signs: BP 121/71  Pulse 71  Temp(Src) 98.3 F (36.8 C) (Oral)  Resp 16  Ht 5\' 3"  (1.6 m)  Wt 156 lb (70.761 kg)  BMI 27.64 kg/m2  SpO2 99% General: Well-developed, well-nourished, no acute distress HEENT: Sclerae are anicteric, conjunctiva pink. Oral mucosa intact Lungs: Clear Heart: Regular Abdomen: soft, nontender, nondistended, no obvious ascites, no peritoneal signs, normal bowel sounds. No organomegaly. Extremities: No edema Psychiatric: alert and oriented x3. Cooperative   ASSESSMENT:  #1. Upper abdominal pain.  Likely related to cholelithiasis. Resolved #2. Scant hematemesis. EGD revealing proximal gastric varices without stigmata. Not felt to be the source of scant hematemesis. Otherwise negative EGD #3. Hepatic cirrhosis. Presumably due to hepatitis C. Remote history of alcohol abuse per patient/son #4. Indeterminate small liver lesion. Normal AFP #5. General medical care.???   RECOMMENDATIONS:  #1. Regular diet. If tolerated may be discharged home #2. Recommend you obtain Gen. surgical consultation regarding symptomatic cholelithiasis. She would be high risk for cholecystectomy, but I would get their opinion. She may be present at some point with more ominous biliary issues (acute cholecystitis, choledocholithiasis, cholangitis...). #3. She needs a primary care provider. Give her son resources to accomplish this please. #4. Repeat MRI of the liver to evaluate indeterminate lesion-in 6 months. #5. Please call for questions. She is free to followup in my office as needed for GI issues, but she needs a primary provider to address coordinator primary care needs. Please call for questions  Will sign off. Thank you  Docia Chuck. Geri Seminole., M.D. [facility] Division of Gastroenterology

## 2014-05-12 NOTE — Progress Notes (Signed)
[facility] PROGRESS NOTE  Heidi Booker NFA:[id number] DOB: [birth year] DOA: 05/08/2014 PCP: No PCP Per Patient  Assessment/Plan: 1. Upper GI bleed 1. Hgb remains stable at around 12 2. GI consulted and are following 3. EGD 5/27, unremarkable findings 4. Pt is s/p unremarkable CTabd/pelvis with patent portal vein system 2. Abd pain 1. Elevated alk phos 2. For MRCP 3. Hep C cirrhosis 1. Will follow LFT's, moderately elevated but appear stable 4. HTN 1. BP stable 5. DVT prophylaxis 1. SCD's  Code Status: Full Family Communication: Pt in room Disposition Plan: Pending  Consultants:  GI  Procedures:  EGD pending  Antibiotics:  HPI/Subjective: No complaints. Denies abd pain  Objective: Filed Vitals:   05/11/14 1625 05/11/14 2109 05/12/14 0528 05/12/14 0631  BP: 122/70 122/49 128/56   Pulse: 73 71 75   Temp: 98.6 F (37 C) 98 F (36.7 C) 97.7 F (36.5 C)   TempSrc: Oral Oral Oral   Resp: $Remo'20 18 15   'KgUhL$ Height: $Rem'5\' 3"'BWSH$  (1.6 m)     Weight:    70.761 kg (156 lb)  SpO2: 97% 96% 91%    No intake or output data in the 24 hours ending 05/12/14 1236 Filed Weights   05/09/14 0552 05/11/14 0430 05/12/14 0631  Weight: 75.3 kg (166 lb 0.1 oz) 75.2 kg (165 lb 12.6 oz) 70.761 kg (156 lb)    Exam:   General:  Awake, in nad  Cardiovascular: regular, s1, s2  Respiratory: normal resp effort, no wheezing  Abdomen: soft, nondistended  Musculoskeletal: perfused, no clubbing   Data Reviewed: Basic Metabolic Panel:  Recent Labs Lab 05/08/14 1228 05/09/14 0245  NA 139 138  K 3.8 4.1  CL 106 108  CO2 23 22  GLUCOSE 170* 105*  BUN 14 12  CREATININE 0.75 0.72  CALCIUM 8.6 7.9*   Liver Function Tests:  Recent Labs Lab 05/08/14 1228 05/09/14 1452  AST 138* 139*  ALT 68* 63*  ALKPHOS 137* 119*  BILITOT 1.1 1.6*  PROT 7.9 7.4  ALBUMIN 2.0* 1.8*    Recent Labs Lab 05/09/14 1452  LIPASE 55   No results found for this basename: AMMONIA,  in the last  168 hours CBC:  Recent Labs Lab 05/08/14 1228  05/09/14 0245  05/10/14 1615 05/10/14 2000 05/11/14 0043 05/11/14 0354 05/12/14 0617  WBC 7.6  --  6.0  --   --   --   --   --  8.0  NEUTROABS 3.5  --   --   --   --   --   --   --   --   HGB 12.0  < > 11.1*  < > 11.3* 11.8* 11.5* 11.7* 12.7  HCT 36.4  < > 34.7*  < > 33.9* 37.2 34.3* 35.0* 38.3  MCV 84.7  --  86.1  --   --   --   --   --  85.5  PLT 66*  --  64*  --   --   --   --   --  86*  < > = values in this interval not displayed. Cardiac Enzymes: No results found for this basename: CKTOTAL, CKMB, CKMBINDEX, TROPONINI,  in the last 168 hours BNP (last 3 results) No results found for this basename: PROBNP,  in the last 8760 hours CBG: No results found for this basename: GLUCAP,  in the last 168 hours  Recent Results (from the past 240 hour(s))  MRSA PCR SCREENING     Status:  None   Collection Time    05/08/14  4:28 PM      Result Value Ref Range Status   MRSA by PCR NEGATIVE  NEGATIVE Final   Comment:            The GeneXpert MRSA Assay (FDA     approved for NASAL specimens     only), is one component of a     comprehensive MRSA colonization     surveillance program. It is not     intended to diagnose MRSA     infection nor to guide or     monitor treatment for     MRSA infections.     Studies: Mr 3d Recon At Scanner  05/15/2014   CLINICAL DATA:  Cholelithiasis, elevated LFTs, evaluate for choledocholithiasis  EXAM: MRI ABDOMEN WITHOUT AND WITH CONTRAST (INCLUDING MRCP)  TECHNIQUE: Multiplanar multisequence MR imaging of the abdomen was performed both before and after the administration of intravenous contrast. Heavily T2-weighted images of the biliary and pancreatic ducts were obtained, and three-dimensional MRCP images were rendered by post processing.  CONTRAST:  61mL MULTIHANCE GADOBENATE DIMEGLUMINE 529 MG/ML IV SOLN  COMPARISON:  CT abdomen pelvis dated 05/09/2014  FINDINGS: Mildly nodular hepatic contour,  suggesting cirrhosis. Scattered intrinsic T1 hyperintense nodularity in the liver (for example, series 1400/images 18, 21, and 30), most of which do not demonstrate convincing enhancement on postcontrast imaging an are favored to reflect regenerating nodules. However, a single 8 mm lesion in the anterior segment left hepatic lobe may demonstrate portal venous washout (series 1403/ image 23), at least worrisome for a dysplastic nodule. Additionally, a 12 mm subcapsular lesion along the posterior segment right hepatic lobe may demonstrate arterial enhancement on postcontrast subtraction imaging (series 11401/image 33) with possible portal venous washout on subtraction imaging (series 11404/image 31), indeterminate but small HCC not excluded.  No hepatic steatosis.  Spleen is within the upper limits of normal for size.  Pancreas and adrenal glands are within normal limits.  Two mobile gallstones, without associated inflammatory changes. No intrahepatic or extrahepatic ductal dilatation. Common duct measures 4 mm. No choledocholithiasis is seen.  Kidneys are within normal limits.  No hydronephrosis.  Portal vein, splenic vein, and SMV are patent. 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  No abdominal ascites.  Small periportal nodes, likely reactive.  No focal osseous lesions.  IMPRESSION: Cholelithiasis. No intrahepatic or extrahepatic ductal dilatation. Common duct measures 4 mm. No choledocholithiasis is seen.  Cirrhosis. 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 Portal vein and splenic vein remain patent.  12 mm subcapsular lesion along the posterior segment right hepatic lobe may demonstrate enhancement characteristics worrisome for small HCC, although indeterminate. Additional 8 mm lesion in the anterior segment left hepatic lobe is at least worrisome for a dysplastic nodule. Additional probable scattered regenerating nodules.  Correlate with  AFP. Given the small size of the dominant lesion and the somewhat equivocal imaging appearance, consider follow-up MRI abdomen with/without contrast in 3-6 months for further evaluation.   Electronically Signed   By: Julian Hy M.D.   On: 15-May-2014 12:27   Mr Jeananne Rama W/wo Cm/mrcp  05/15/14   CLINICAL DATA:  Cholelithiasis, elevated LFTs, evaluate for choledocholithiasis  EXAM: MRI ABDOMEN WITHOUT AND WITH CONTRAST (INCLUDING MRCP)  TECHNIQUE: Multiplanar multisequence MR imaging of the abdomen was performed both before and after the administration of intravenous contrast. Heavily T2-weighted images of the biliary and pancreatic ducts were obtained, and three-dimensional MRCP images  were rendered by post processing.  CONTRAST:  63mL MULTIHANCE GADOBENATE DIMEGLUMINE 529 MG/ML IV SOLN  COMPARISON:  CT abdomen pelvis dated 05/09/2014  FINDINGS: Mildly nodular hepatic contour, suggesting cirrhosis. Scattered intrinsic T1 hyperintense nodularity in the liver (for example, series 1400/images 18, 21, and 30), most of which do not demonstrate convincing enhancement on postcontrast imaging an are favored to reflect regenerating nodules. However, a single 8 mm lesion in the anterior segment left hepatic lobe may demonstrate portal venous washout (series 1403/ image 23), at least worrisome for a dysplastic nodule. Additionally, a 12 mm subcapsular lesion along the posterior segment right hepatic lobe may demonstrate arterial enhancement on postcontrast subtraction imaging (series 11401/image 33) with possible portal venous washout on subtraction imaging (series 11404/image 31), indeterminate but small HCC not excluded.  No hepatic steatosis.  Spleen is within the upper limits of normal for size.  Pancreas and adrenal glands are within normal limits.  Two mobile gallstones, without associated inflammatory changes. No intrahepatic or extrahepatic ductal dilatation. Common duct measures 4 mm. No choledocholithiasis is  seen.  Kidneys are within normal limits.  No hydronephrosis.  Portal vein, splenic vein, and SMV are patent. 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  No abdominal ascites.  Small periportal nodes, likely reactive.  No focal osseous lesions.  IMPRESSION: Cholelithiasis. No intrahepatic or extrahepatic ductal dilatation. Common duct measures 4 mm. No choledocholithiasis is seen.  Cirrhosis. 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 Portal vein and splenic vein remain patent.  12 mm subcapsular lesion along the posterior segment right hepatic lobe may demonstrate enhancement characteristics worrisome for small HCC, although indeterminate. Additional 8 mm lesion in the anterior segment left hepatic lobe is at least worrisome for a dysplastic nodule. Additional probable scattered regenerating nodules.  Correlate with AFP. Given the small size of the dominant lesion and the somewhat equivocal imaging appearance, consider follow-up MRI abdomen with/without contrast in 3-6 months for further evaluation.   Electronically Signed   By: Julian Hy M.D.   On: 05/12/2014 12:27    Scheduled Meds: . pantoprazole (PROTONIX) IV  40 mg Intravenous Q12H  . sodium chloride  3 mL Intravenous Q12H  . sodium chloride  3 mL Intravenous Q12H   Continuous Infusions:    Principal Problem:   Hematemesis Active Problems:   Hepatitis C   Hypertension   Ulcer   UGI bleed   Time spent: 2min   Stephen K Chiu  [facility] Pager [phone]. If 7PM-7AM, please contact night-coverage at [email or web address], password [facility] 05/12/2014, 12:36 PM  LOS: 4 days

## 2014-05-12 NOTE — Progress Notes (Signed)
GI note reviewed and appreciated. Recs for advancing diet today and for discussing case with General Surgery. Discussed case with on-call surgeon. Per surgeon,given hx of liver disease and varices, cholecystectomy would be considered too risky. As such, surgery recommends follow up with primary GI with referral to surgical specialist if still felt necessary by primary gastroenterologist.

## 2014-05-13 DIAGNOSIS — I8500 Esophageal varices without bleeding: Secondary | ICD-10-CM | POA: Diagnosis not present

## 2014-05-13 DIAGNOSIS — K8020 Calculus of gallbladder without cholecystitis without obstruction: Secondary | ICD-10-CM | POA: Diagnosis not present

## 2014-05-13 DIAGNOSIS — B1920 Unspecified viral hepatitis C without hepatic coma: Secondary | ICD-10-CM | POA: Diagnosis not present

## 2014-05-13 DIAGNOSIS — R1013 Epigastric pain: Secondary | ICD-10-CM | POA: Diagnosis not present

## 2014-05-13 MED ORDER — PANTOPRAZOLE SODIUM 40 MG PO TBEC: 40.0000 mg | DELAYED_RELEASE_TABLET | Freq: Two times a day (BID) | ORAL | Status: DC

## 2014-05-13 MED ORDER — ONDANSETRON HCL 4 MG PO TABS: 4.0000 mg | ORAL_TABLET | Freq: Four times a day (QID) | ORAL | Status: DC | PRN

## 2014-05-13 MED ORDER — HYDROCODONE-ACETAMINOPHEN 5-325 MG PO TABS: 1.0000 | ORAL_TABLET | ORAL | Status: DC | PRN

## 2014-05-13 MED ORDER — PANTOPRAZOLE SODIUM 40 MG PO TBEC: 40.0000 mg | DELAYED_RELEASE_TABLET | Freq: Every day | ORAL | Status: DC

## 2014-05-13 NOTE — Progress Notes (Signed)
Patient discharge teaching given, including activity, diet, follow-up appoints, and medications. Patient verbalized understanding of all discharge instructions. IV access was d/c'd. Vitals are stable. Skin is intact except as charted in most recent assessments. Pt to be escorted out by NT, to be driven home by family. 

## 2014-05-13 NOTE — Care Management Note (Signed)
    Page 1 of 1   05/14/2014     5:50:40 PM CARE MANAGEMENT NOTE 05/14/2014  Patient:  Heidi Booker, Heidi Booker   Account Number:  [id number]  Date Initiated:  05/08/2014  Documentation initiated by:  [facility]  Subjective/Objective Assessment:   upper GI bleed     Action/Plan:   Anticipated DC Date:  05/13/2014   Anticipated DC Plan:  [place]  CM consult      Choice offered to / List presented to:             Status of service:  Completed, signed off Medicare Important Message given?  YES (If response is "NO", the following Medicare IM given date fields will be blank) Date Medicare IM given:  05/08/2014 Date Additional Medicare IM given:    Discharge Disposition:  HOME/SELF CARE  Per UR Regulation:  Reviewed for med. necessity/level of care/duration of stay  If discussed at Long Length of Stay Meetings, dates discussed:    Comments:  05/13/1510:00  CM gave pt's family PCP resource list to secure a PCP.  No other CM needs were communicated.  Mariane Masters, BSN, CM [phone].

## 2014-05-13 NOTE — Discharge Summary (Signed)
Physician Discharge Summary  Heidi Booker UYQ:[id number] DOB: Jan 14, 1946 DOA: 05/08/2014  PCP: No PCP Per Patient  Admit date: 05/08/2014 Discharge date: 05/13/2014  Time spent: 35 minutes  Recommendations for Outpatient Follow-up:  1. Follow up with PCP in 1-2 weeks 2. Follow up with primary gastroenterologist within 2-3 weeks 3. Would repeat MRI abd with and without contrast in 3-6 months with focus on 12 mm subcapsular lesion along the posterior segment right hepatic lobe 4. Consider referral to General Surgery for ? cholecystectomy  Discharge Diagnoses:  Principal Problem:   Hematemesis Active Problems:   Hepatitis C   Hypertension   Ulcer   UGI bleed   Discharge Condition: Stable  Diet recommendation: Regular  Filed Weights   05/11/14 0430 05/12/14 0631 05/13/14 0523  Weight: 75.2 kg (165 lb 12.6 oz) 70.761 kg (156 lb) 70.897 kg (156 lb 4.8 oz)    History of present illness:  Heidi Booker is a 68 y.o. female, with history of hypertension, gastric ulcer, hepatitis C with cirrhosis and Esophageal varices who follows with [facility] hepatology clinic and speaks limited [place], earlier son was here and was translating but currently she has left the room, comes to the ER with 15-34 hour history of epigastric abdominal pain which at times radiates to her back, associated with hematemesis, she claims that she has thrown up about half a cup of blood so far, no blood in stool or black colored stool, denies any chest pain or palpitations, no headache, no fever chills, no focal weakness or any other subjective complaint. In the ER she was hematologically stable with stable hemoglobin hematocrit, her case was discussed with [facility] who requested that patient be started on IV Protonix and octreotide and he admitted by hospitalist.  Hospital Course:  Upper GI bleed  1. Hgb remains stable at around 12 2. GI was consulted 3. EGD 5/27, unremarkable findings 4. Pt is s/p  unremarkable CTabd/pelvis with patent portal vein system Abd pain  1. Elevated alk phos 2. MRCP notable for cholelithiasis without biliary ductal obstruction 3. Had discussed case with on-call surgeon. Per surgeon,given hx of liver disease and varices, cholecystectomy would be considered too risky. As such, surgery recommends follow up with primary GI with referral to surgical specialist if still felt necessary by primary gastroenterologist. 4. Otherwise stable for discharge from GI standpoint and GI had since signed off 5/30 Hep C cirrhosis  1. Follow LFT's, moderately elevated but appear stable HTN  1. BP stable DVT prophylaxis  1. SCD's  Procedures: EGD ERCP  Consultations:  GI  Discharge Exam: Filed Vitals:   05/12/14 0631 05/12/14 1258 05/12/14 2130 05/13/14 0523  BP:  121/71 89/52 109/62  Pulse:  71 75 53  Temp:  98.3 F (36.8 C) 98.2 F (36.8 C) 98.2 F (36.8 C)  TempSrc:  Oral Oral Oral  Resp:  $Remo'16 16 16  'RjgpS$ Height:      Weight: 70.761 kg (156 lb)   70.897 kg (156 lb 4.8 oz)  SpO2:  99% 100% 99%    General: awake, in nad Cardiovascular: regular, s1, s2 Respiratory: normal resp effort, no wheezing  Discharge Instructions     Medication List         benazepril 10 MG tablet  Commonly known as:  LOTENSIN  Take 10 mg by mouth 2 (two) times daily.     HYDROcodone-acetaminophen 5-325 MG per tablet  Commonly known as:  NORCO/VICODIN  Take 1-2 tablets by mouth every 4 (four) hours as  needed for moderate pain.     pantoprazole 40 MG tablet  Commonly known as:  PROTONIX  Take 40 mg by mouth daily.       No Known Allergies Follow-up Information   Follow up with Follow up with your PCP in 1-2 weeks. Schedule an appointment as soon as possible for a visit in 1 week.      Schedule an appointment as soon as possible for a visit with Follow up with your gastroenterologist in 2-3 weeks.       The results of significant diagnostics from this hospitalization  (including imaging, microbiology, ancillary and laboratory) are listed below for reference.    Significant Diagnostic Studies: Ct Abdomen Pelvis W Contrast  05/10/2014   ADDENDUM REPORT: 05/10/2014 15:38  ADDENDUM: The original report was by Dr. Dr. Dellis Filbert chain. The following addendum is by Dr. Van Clines:  I received a phone call regarding a request to specifically comment on the splenic vein in this case, with regard to patency or splenic vein thrombosis. I have reviewed the splenic vein in this regard but I have not reviewed the entire case, which was dictated by Dr. Radene Knee.  The splenic vein appears patent. As it approaches the spleen, it collects from multiple varices including splenorenal varices.   Electronically Signed   By: Sherryl Barters M.D.   On: 05/10/2014 15:38   05/10/2014   CLINICAL DATA:  Abdominal pain.  Assess for portal vein thrombosis.  EXAM: CT ABDOMEN AND PELVIS WITH CONTRAST  TECHNIQUE: Multidetector CT imaging of the abdomen and pelvis was performed using the standard protocol following bolus administration of intravenous contrast.  CONTRAST:  161mL OMNIPAQUE IOHEXOL 300 MG/ML  SOLN  COMPARISON:  None.  FINDINGS: Mild interstitial prominence at the lung bases could reflect minimal interstitial edema. Minimal bibasilar atelectasis is noted.  Diffuse nodularity of hepatic contour is compatible with cirrhosis. Scattered vague hyperdensities within the liver are nonspecific. A small 9 mm nodule is noted at the hepatic hilum, of uncertain significance. No definite dominant mass is identified. The gallbladder contains two stones and is otherwise unremarkable in appearance. The spleen is within normal limits.  The portal venous system remains patent.  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 The pancreas and adrenal glands are unremarkable in appearance. A likely splenorenal shunt is noted.  The kidneys are unremarkable in appearance. There is no evidence of hydronephrosis. No  renal or ureteral stones are seen. No perinephric stranding is appreciated.  No free fluid is identified. The small bowel is unremarkable in appearance. The stomach is within normal limits. No acute vascular abnormalities are seen. Mild scattered calcification is seen along the distal abdominal aorta and its branches.  The appendix is normal in caliber, without evidence for appendicitis. Relatively diffuse diverticulosis is noted along the ascending, descending and sigmoid colon, without definite evidence of diverticulitis.  The bladder is mildly distended and grossly unremarkable in appearance. The patient is status post hysterectomy. No suspicious adnexal masses are seen. The ovaries are relatively symmetric. No inguinal lymphadenopathy is seen.  No acute osseous abnormalities are identified. Vacuum phenomenon is incidentally noted at L5-S1, with disc space narrowing.  IMPRESSION: 1. Portal venous system remains patent. 2. Mild interstitial prominence at the lung bases could reflect minimal interstitial edema. Would correlate for associated symptoms. 3. Changes of hepatic cirrhosis noted. Scattered vague hyperdensities noted within the liver, nonspecific in nature. No dominant mass identified. Contrast-enhanced MRI could be considered for further evaluation as deemed clinically appropriate,  when the patient is able to hold her breath for the study. 4. Nonspecific 9 mm nodule at the hepatic hilum; this could reflect a small lymph node. 5. Cholelithiasis; gallbladder otherwise unremarkable in appearance. 6. Large splenic and gastric varices noted. Apparent splenorenal shunt seen. 7. Mild scattered calcification along the distal abdominal aorta and its branches. 8. Relatively diffuse diverticulosis along the ascending, descending and sigmoid colon, without definite evidence of diverticulitis.  Electronically Signed: By: Roanna Raider M.D. On: 05/10/2014 04:46   Mr 3d Recon At Scanner  05/12/2014   CLINICAL DATA:   Cholelithiasis, elevated LFTs, evaluate for choledocholithiasis  EXAM: MRI ABDOMEN WITHOUT AND WITH CONTRAST (INCLUDING MRCP)  TECHNIQUE: Multiplanar multisequence MR imaging of the abdomen was performed both before and after the administration of intravenous contrast. Heavily T2-weighted images of the biliary and pancreatic ducts were obtained, and three-dimensional MRCP images were rendered by post processing.  CONTRAST:  88mL MULTIHANCE GADOBENATE DIMEGLUMINE 529 MG/ML IV SOLN  COMPARISON:  CT abdomen pelvis dated 05/09/2014  FINDINGS: Mildly nodular hepatic contour, suggesting cirrhosis. Scattered intrinsic T1 hyperintense nodularity in the liver (for example, series 1400/images 18, 21, and 30), most of which do not demonstrate convincing enhancement on postcontrast imaging an are favored to reflect regenerating nodules. However, a single 8 mm lesion in the anterior segment left hepatic lobe may demonstrate portal venous washout (series 1403/ image 23), at least worrisome for a dysplastic nodule. Additionally, a 12 mm subcapsular lesion along the posterior segment right hepatic lobe may demonstrate arterial enhancement on postcontrast subtraction imaging (series 11401/image 33) with possible portal venous washout on subtraction imaging (series 11404/image 31), indeterminate but small HCC not excluded.  No hepatic steatosis.  Spleen is within the upper limits of normal for size.  Pancreas and adrenal glands are within normal limits.  Two mobile gallstones, without associated inflammatory changes. No intrahepatic or extrahepatic ductal dilatation. Common duct measures 4 mm. No choledocholithiasis is seen.  Kidneys are within normal limits.  No hydronephrosis.  Portal vein, splenic vein, and SMV are patent. 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  No abdominal ascites.  Small periportal nodes, likely reactive.  No focal osseous lesions.  IMPRESSION: Cholelithiasis. No intrahepatic or extrahepatic  ductal dilatation. Common duct measures 4 mm. No choledocholithiasis is seen.  Cirrhosis. 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 Portal vein and splenic vein remain patent.  12 mm subcapsular lesion along the posterior segment right hepatic lobe may demonstrate enhancement characteristics worrisome for small HCC, although indeterminate. Additional 8 mm lesion in the anterior segment left hepatic lobe is at least worrisome for a dysplastic nodule. Additional probable scattered regenerating nodules.  Correlate with AFP. Given the small size of the dominant lesion and the somewhat equivocal imaging appearance, consider follow-up MRI abdomen with/without contrast in 3-6 months for further evaluation.   Electronically Signed   By: Charline Bills M.D.   On: 05/12/2014 12:27   Dg Chest Portable 1 View  05/08/2014   CLINICAL DATA:  HEMATEMESIS  EXAM: PORTABLE CHEST - 1 VIEW  COMPARISON:  None available  FINDINGS: Diffuse interstitial edema or infiltrates bilaterally. Heart size normal. Atheromatous aorta. . No effusion. Degenerative changes in bilateral shoulders.  IMPRESSION: Mild diffuse bilateral edema or interstitial infiltrates.   Electronically Signed   By: Oley Balm M.D.   On: 05/08/2014 13:04   Mr Abd W/wo Cm/mrcp  05/12/2014   CLINICAL DATA:  Cholelithiasis, elevated LFTs, evaluate for choledocholithiasis  EXAM: MRI ABDOMEN  WITHOUT AND WITH CONTRAST (INCLUDING MRCP)  TECHNIQUE: Multiplanar multisequence MR imaging of the abdomen was performed both before and after the administration of intravenous contrast. Heavily T2-weighted images of the biliary and pancreatic ducts were obtained, and three-dimensional MRCP images were rendered by post processing.  CONTRAST:  93mL MULTIHANCE GADOBENATE DIMEGLUMINE 529 MG/ML IV SOLN  COMPARISON:  CT abdomen pelvis dated 05/09/2014  FINDINGS: Mildly nodular hepatic contour, suggesting cirrhosis. Scattered  intrinsic T1 hyperintense nodularity in the liver (for example, series 1400/images 18, 21, and 30), most of which do not demonstrate convincing enhancement on postcontrast imaging an are favored to reflect regenerating nodules. However, a single 8 mm lesion in the anterior segment left hepatic lobe may demonstrate portal venous washout (series 1403/ image 23), at least worrisome for a dysplastic nodule. Additionally, a 12 mm subcapsular lesion along the posterior segment right hepatic lobe may demonstrate arterial enhancement on postcontrast subtraction imaging (series 11401/image 33) with possible portal venous washout on subtraction imaging (series 11404/image 31), indeterminate but small HCC not excluded.  No hepatic steatosis.  Spleen is within the upper limits of normal for size.  Pancreas and adrenal glands are within normal limits.  Two mobile gallstones, without associated inflammatory changes. No intrahepatic or extrahepatic ductal dilatation. Common duct measures 4 mm. No choledocholithiasis is seen.  Kidneys are within normal limits.  No hydronephrosis.  Portal vein, splenic vein, and SMV are patent. 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  No abdominal ascites.  Small periportal nodes, likely reactive.  No focal osseous lesions.  IMPRESSION: Cholelithiasis. No intrahepatic or extrahepatic ductal dilatation. Common duct measures 4 mm. No choledocholithiasis is seen.  Cirrhosis. 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 Portal vein and splenic vein remain patent.  12 mm subcapsular lesion along the posterior segment right hepatic lobe may demonstrate enhancement characteristics worrisome for small HCC, although indeterminate. Additional 8 mm lesion in the anterior segment left hepatic lobe is at least worrisome for a dysplastic nodule. Additional probable scattered regenerating nodules.  Correlate with AFP. Given the small size of the  dominant lesion and the somewhat equivocal imaging appearance, consider follow-up MRI abdomen with/without contrast in 3-6 months for further evaluation.   Electronically Signed   By: Charline Bills M.D.   On: 05/12/2014 12:27    Microbiology: Recent Results (from the past 240 hour(s))  MRSA PCR SCREENING     Status: None   Collection Time    05/08/14  4:28 PM      Result Value Ref Range Status   MRSA by PCR NEGATIVE  NEGATIVE Final   Comment:            The GeneXpert MRSA Assay (FDA     approved for NASAL specimens     only), is one component of a     comprehensive MRSA colonization     surveillance program. It is not     intended to diagnose MRSA     infection nor to guide or     monitor treatment for     MRSA infections.     Labs: Basic Metabolic Panel:  Recent Labs Lab 05/08/14 1228 05/09/14 0245  NA 139 138  K 3.8 4.1  CL 106 108  CO2 23 22  GLUCOSE 170* 105*  BUN 14 12  CREATININE 0.75 0.72  CALCIUM 8.6 7.9*   Liver Function Tests:  Recent Labs Lab 05/08/14 1228 05/09/14 1452  AST 138* 139*  ALT 68* 63*  ALKPHOS  137* 119*  BILITOT 1.1 1.6*  PROT 7.9 7.4  ALBUMIN 2.0* 1.8*    Recent Labs Lab 05/09/14 1452  LIPASE 55   No results found for this basename: AMMONIA,  in the last 168 hours CBC:  Recent Labs Lab 05/08/14 1228  05/09/14 0245  05/10/14 1615 05/10/14 2000 05/11/14 0043 05/11/14 0354 05/12/14 0617  WBC 7.6  --  6.0  --   --   --   --   --  8.0  NEUTROABS 3.5  --   --   --   --   --   --   --   --   HGB 12.0  < > 11.1*  < > 11.3* 11.8* 11.5* 11.7* 12.7  HCT 36.4  < > 34.7*  < > 33.9* 37.2 34.3* 35.0* 38.3  MCV 84.7  --  86.1  --   --   --   --   --  85.5  PLT 66*  --  64*  --   --   --   --   --  86*  < > = values in this interval not displayed. Cardiac Enzymes: No results found for this basename: CKTOTAL, CKMB, CKMBINDEX, TROPONINI,  in the last 168 hours BNP: BNP (last 3 results) No results found for this basename:  PROBNP,  in the last 8760 hours CBG: No results found for this basename: GLUCAP,  in the last 168 hours  Signed:  Donne Hazel  [facility] 05/13/2014, 9:16 AM

## 2014-05-15 ENCOUNTER — Encounter: Payer: Self-pay | Admitting: Gastroenterology

## 2014-06-05 DIAGNOSIS — H2510 Age-related nuclear cataract, unspecified eye: Secondary | ICD-10-CM | POA: Diagnosis not present

## 2014-06-06 DIAGNOSIS — M25579 Pain in unspecified ankle and joints of unspecified foot: Secondary | ICD-10-CM | POA: Diagnosis not present

## 2014-07-17 ENCOUNTER — Ambulatory Visit: Payer: Medicare Other | Admitting: Gastroenterology

## 2014-07-23 ENCOUNTER — Telehealth: Payer: Self-pay | Admitting: Internal Medicine

## 2014-07-23 NOTE — Telephone Encounter (Signed)
Pt had old appt in the system to see Dr. Ardis Hughs for today. Appt had been cancelled and the pt and daughter misunderstood. Pt scheduled to see Alonza Bogus PA 07/25/14@2 :30pm. Pt aware of appt.

## 2014-07-24 DIAGNOSIS — M25469 Effusion, unspecified knee: Secondary | ICD-10-CM | POA: Diagnosis not present

## 2014-07-25 ENCOUNTER — Ambulatory Visit: Payer: Medicare Other | Admitting: Gastroenterology

## 2014-07-31 ENCOUNTER — Telehealth: Payer: Self-pay | Admitting: *Deleted

## 2014-07-31 ENCOUNTER — Encounter: Payer: Self-pay | Admitting: Nurse Practitioner

## 2014-07-31 ENCOUNTER — Other Ambulatory Visit (INDEPENDENT_AMBULATORY_CARE_PROVIDER_SITE_OTHER): Payer: Medicare Other

## 2014-07-31 ENCOUNTER — Ambulatory Visit (INDEPENDENT_AMBULATORY_CARE_PROVIDER_SITE_OTHER): Payer: Medicare Other | Admitting: Nurse Practitioner

## 2014-07-31 VITALS — BP 110/68 | HR 68 | Ht 63.0 in | Wt 168.2 lb

## 2014-07-31 DIAGNOSIS — R933 Abnormal findings on diagnostic imaging of other parts of digestive tract: Secondary | ICD-10-CM | POA: Diagnosis not present

## 2014-07-31 DIAGNOSIS — K7460 Unspecified cirrhosis of liver: Secondary | ICD-10-CM

## 2014-07-31 LAB — CBC WITH DIFFERENTIAL/PLATELET
BASOS PCT: 0.3 % (ref 0.0–3.0)
Basophils Absolute: 0 10*3/uL (ref 0.0–0.1)
Eosinophils Absolute: 0.3 10*3/uL (ref 0.0–0.7)
Eosinophils Relative: 4 % (ref 0.0–5.0)
HCT: 38.3 % (ref 36.0–46.0)
Hemoglobin: 12.6 g/dL (ref 12.0–15.0)
LYMPHS PCT: 45.9 % (ref 12.0–46.0)
Lymphs Abs: 3.1 10*3/uL (ref 0.7–4.0)
MCHC: 32.8 g/dL (ref 30.0–36.0)
MCV: 87.4 fl (ref 78.0–100.0)
MONOS PCT: 14.2 % — AB (ref 3.0–12.0)
Monocytes Absolute: 1 10*3/uL (ref 0.1–1.0)
Neutro Abs: 2.4 10*3/uL (ref 1.4–7.7)
Neutrophils Relative %: 35.6 % — ABNORMAL LOW (ref 43.0–77.0)
Platelets: 57 10*3/uL — ABNORMAL LOW (ref 150.0–400.0)
RBC: 4.38 Mil/uL (ref 3.87–5.11)
RDW: 17.5 % — ABNORMAL HIGH (ref 11.5–15.5)
WBC: 6.8 10*3/uL (ref 4.0–10.5)

## 2014-07-31 LAB — COMPREHENSIVE METABOLIC PANEL
ALT: 83 U/L — ABNORMAL HIGH (ref 0–35)
AST: 172 U/L — ABNORMAL HIGH (ref 0–37)
Albumin: 2.2 g/dL — ABNORMAL LOW (ref 3.5–5.2)
Alkaline Phosphatase: 137 U/L — ABNORMAL HIGH (ref 39–117)
BUN: 21 mg/dL (ref 6–23)
CO2: 26 meq/L (ref 19–32)
CREATININE: 0.8 mg/dL (ref 0.4–1.2)
Calcium: 8.1 mg/dL — ABNORMAL LOW (ref 8.4–10.5)
Chloride: 107 mEq/L (ref 96–112)
GFR: 89.78 mL/min (ref >60.00)
GLUCOSE: 103 mg/dL — AB (ref 70–99)
Potassium: 4 mEq/L (ref 3.5–5.1)
Sodium: 137 mEq/L (ref 135–145)
TOTAL PROTEIN: 8.1 g/dL (ref 6.0–8.3)
Total Bilirubin: 1.8 mg/dL — ABNORMAL HIGH (ref 0.2–1.2)

## 2014-07-31 LAB — PROTIME-INR
INR: 1.5 ratio — AB (ref 0.8–1.0)
Prothrombin Time: 16.4 s — ABNORMAL HIGH (ref 9.6–13.1)

## 2014-07-31 NOTE — Patient Instructions (Signed)
Please go to the basement level to have your labs drawn.  Come to our lab Monday 10-15-2014. Anytime between 7:30am and 5:30 am .  We scheduled the MRI at [facility] Date:  10-22-2014.Heidi Booker at 8:45 am. Have nothing by mouth after 2:00 AM.

## 2014-07-31 NOTE — Progress Notes (Signed)
     History of Present Illness:  Patient is a 68 year old non-English speaking female from [place] who we met in the hospital in May of this year for evaluation of small volume hematemesis in setting of cirrhosis (? HCV related).  Inpatient EGD for upper abdominal pain and minor hematemesis revealed gastric varices. CT scan was negative for splenic vein thrombosis but did show some liver lesions.. To better characterize these lesions an MRI was obtained and revealed a 12 mm lesion in the right hepatic lobe as well as a smaller lesion in the left lobe. Alpha-fetoprotein was normal. Plan was for repeat MRI in 3-6 months.  Patient is here with her son who helps with translation. Patient was seen at an acute care facility couple of weeks ago. Family was called this morning with some abnormal lab results, son mentions platelets. Difficult to get a good history but patient describes" spitting" blood up every morning. She denies vomiting. She describes scant rectal bleeding with bowel movements, ongoing for several months. She continues to have intermittent episodes of generalized abdominal pain. She has gallstones but no evidence for cholecystitis on imaging studies in May.    Current Medications, Allergies, Past Medical History, Past Surgical History, Family History and Social History were reviewed in [employer] record.  Physical Exam: General: Pleasant, obese female in no acute distress Head: Normocephalic and atraumatic Eyes:  sclerae anicteric, conjunctiva pink  Ears: Normal auditory acuity Lungs: Clear throughout to auscultation Heart: Regular rate and rhythm Abdomen: Soft, non distended, non-tender. No masses, no hepatomegaly. Normal bowel sounds Musculoskeletal: Symmetrical with no gross deformities  Extremities: No edema  Neurological: Alert oriented x 4, grossly nonfocal Psychological:  Alert and cooperative. Normal mood and affect  MRI without and with  contrast 06/12/14 IMPRESSION:  Cholelithiasis. No intrahepatic or extrahepatic ductal dilatation. Common duct measures 4 mm. No choledocholithiasis is seen. Cirrhosis. 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 Portal vein and splenic  vein remain patent. 12 mm subcapsular lesion along the posterior segment right hepatic llobe may demonstrate enhancement characteristics worrisome for small  HCC, although indeterminate. Additional 8 mm lesion in the anterior segment left hepatic lobe is at least worrisome for a dysplastic nodule. Additional probable scattered regenerating nodules. Correlate with AFP. Given the small size of the dominant lesion and the somewhat equivocal imaging appearance, consider follow-up MRI  abdomen with/without contrast in 3-6 months for further evaluation.  Electronically Signed  By: Julian Hy M.D.  On: 05/12/2014 12:  Assessment and Recommendations:   53. 68 year-old.non [place]- speaking female with a history of cirrhosis (? HCV related). Inpatient EGD late May of this year revealed only gastric varices. No hematemesis since discharge, she describes more of a spitting up of blood (bleeding gums?). I requested recent lab work from urgent care  and I will repeat labs today as well.  Patient is heme negative, no evidence for GI bleeding at this time. Cirrhosis  compensated at this time. Patient knows to call us or go to ED if VOMITING or passing blood. Discussed signs of encephalopathy with son.   2. Indeterminate liver lesions by MRI. AFP only 7.5. She is due for surveillance MRI October-November which we will go ahead and schedule.   3 Cholelithiasis, asymptomatic at present. Inpatient EGD in May for pain was unrevealing. If she has recurrent pain would consider surgical evaluation.

## 2014-07-31 NOTE — Telephone Encounter (Signed)
I spoke to [place] the patient's daughter who is authorized on this patient's chart.   I told her to hang up and I will call and leave her a message since she is driving.  I LM for her with [facility]'s address and phone number.

## 2014-08-01 ENCOUNTER — Encounter: Payer: Self-pay | Admitting: Nurse Practitioner

## 2014-08-01 DIAGNOSIS — R933 Abnormal findings on diagnostic imaging of other parts of digestive tract: Secondary | ICD-10-CM | POA: Insufficient documentation

## 2014-08-02 NOTE — Progress Notes (Signed)
Assessment as above. The Following is my consultative impression and plan from the hospital. The patient did not have surgical consultation or that she establish a primary care provider as instructed. For recurrent abdominal pain, she needs to see a Psychologist, sport and exercise. Obviously, needs a primary care provider. Finally, needs followup imaging on indeterminate liver lesion (which we will handle) to rule out Harvey. May need to see tertiary liver specialist if this is a concern post imaging. ASSESSMENT:  #1. Upper abdominal pain. Likely related to cholelithiasis. Resolved  #2. Scant hematemesis. EGD revealing proximal gastric varices without stigmata. Not felt to be the source of scant hematemesis. Otherwise negative EGD  #3. Hepatic cirrhosis. Presumably due to hepatitis C. Remote history of alcohol abuse per patient/son  #4. Indeterminate small liver lesion. Normal AFP  #5. General medical care.???  RECOMMENDATIONS:  #1. Regular diet. If tolerated may be discharged home  #2. Recommend you obtain Gen. surgical consultation regarding symptomatic cholelithiasis. She would be high risk for cholecystectomy, but I would get their opinion. She may be present at some point with more ominous biliary issues (acute cholecystitis, choledocholithiasis, cholangitis...).  #3. She needs a primary care provider. Give her son resources to accomplish this please.  #4. Repeat MRI of the liver to evaluate indeterminate lesion-in 6 months.  #5. Please call for questions. She is free to followup in my office as needed for GI issues, but she needs a primary provider to address coordinator primary care needs. Please call for questions  Will sign off. Thank you  Docia Chuck. Geri Seminole., M.D.  [facility]  Division of Gastroenterology

## 2014-08-11 ENCOUNTER — Emergency Department (HOSPITAL_COMMUNITY)
Admission: EM | Admit: 2014-08-11 | Discharge: 2014-08-11 | Disposition: A | Discharge status: Home / Self Care | Payer: Medicare Other | Attending: Emergency Medicine | Admitting: Emergency Medicine

## 2014-08-11 ENCOUNTER — Encounter (HOSPITAL_COMMUNITY): Payer: Self-pay | Admitting: Emergency Medicine

## 2014-08-11 DIAGNOSIS — I10 Essential (primary) hypertension: Secondary | ICD-10-CM | POA: Diagnosis not present

## 2014-08-11 DIAGNOSIS — K7460 Unspecified cirrhosis of liver: Secondary | ICD-10-CM | POA: Diagnosis not present

## 2014-08-11 DIAGNOSIS — Z79899 Other long term (current) drug therapy: Secondary | ICD-10-CM | POA: Diagnosis not present

## 2014-08-11 DIAGNOSIS — Z90710 Acquired absence of both cervix and uterus: Secondary | ICD-10-CM | POA: Diagnosis not present

## 2014-08-11 DIAGNOSIS — K068 Other specified disorders of gingiva and edentulous alveolar ridge: Secondary | ICD-10-CM

## 2014-08-11 DIAGNOSIS — K055 Other periodontal diseases: Secondary | ICD-10-CM | POA: Insufficient documentation

## 2014-08-11 LAB — CBC WITH DIFFERENTIAL/PLATELET
BASOS PCT: 1 % (ref 0–1)
Basophils Absolute: 0 10*3/uL (ref 0.0–0.1)
EOS ABS: 0.3 10*3/uL (ref 0.0–0.7)
Eosinophils Relative: 5 % (ref 0–5)
HCT: 35.1 % — ABNORMAL LOW (ref 36.0–46.0)
Hemoglobin: 11.9 g/dL — ABNORMAL LOW (ref 12.0–15.0)
Lymphocytes Relative: 53 % — ABNORMAL HIGH (ref 12–46)
Lymphs Abs: 2.9 10*3/uL (ref 0.7–4.0)
MCH: 28.2 pg (ref 26.0–34.0)
MCHC: 33.9 g/dL (ref 30.0–36.0)
MCV: 83.2 fL (ref 78.0–100.0)
Monocytes Absolute: 0.9 10*3/uL (ref 0.1–1.0)
Monocytes Relative: 16 % — ABNORMAL HIGH (ref 3–12)
NEUTROS PCT: 25 % — AB (ref 43–77)
Neutro Abs: 1.4 10*3/uL — ABNORMAL LOW (ref 1.7–7.7)
PLATELETS: 59 10*3/uL — AB (ref 150–400)
RBC: 4.22 MIL/uL (ref 3.87–5.11)
RDW: 17.5 % — AB (ref 11.5–15.5)
WBC: 5.5 10*3/uL (ref 4.0–10.5)

## 2014-08-11 LAB — COMPREHENSIVE METABOLIC PANEL
ALBUMIN: 2.1 g/dL — AB (ref 3.5–5.2)
ALK PHOS: 146 U/L — AB (ref 39–117)
ALT: 92 U/L — AB (ref 0–35)
ANION GAP: 7 (ref 5–15)
AST: 178 U/L — ABNORMAL HIGH (ref 0–37)
BUN: 11 mg/dL (ref 6–23)
CALCIUM: 7.9 mg/dL — AB (ref 8.4–10.5)
CO2: 25 mEq/L (ref 19–32)
Chloride: 108 mEq/L (ref 96–112)
Creatinine, Ser: 0.68 mg/dL (ref 0.50–1.10)
GFR calc Af Amer: >90 mL/min (ref >90)
GFR calc non Af Amer: 87 mL/min — ABNORMAL LOW (ref >90)
Glucose, Bld: 101 mg/dL — ABNORMAL HIGH (ref 70–99)
POTASSIUM: 4.1 meq/L (ref 3.7–5.3)
SODIUM: 140 meq/L (ref 137–147)
TOTAL PROTEIN: 7.9 g/dL (ref 6.0–8.3)
Total Bilirubin: 1.9 mg/dL — ABNORMAL HIGH (ref 0.3–1.2)

## 2014-08-11 LAB — PROTIME-INR
INR: 1.63 — AB (ref 0.00–1.49)
Prothrombin Time: 19.3 seconds — ABNORMAL HIGH (ref 11.6–15.2)

## 2014-08-11 MED ORDER — OXYCODONE HCL 5 MG PO TABS
5.0000 mg | ORAL_TABLET | Freq: Once | ORAL | Status: AC
  Administered 2014-08-11: 5 mg via ORAL
  Filled 2014-08-11: qty 1

## 2014-08-11 MED ORDER — ONDANSETRON 4 MG PO TBDP
4.0000 mg | ORAL_TABLET | Freq: Once | ORAL | Status: AC
  Administered 2014-08-11: 4 mg via ORAL
  Filled 2014-08-11: qty 1

## 2014-08-11 MED ORDER — OXYMETAZOLINE HCL 0.05 % NA SOLN
1.0000 | Freq: Once | NASAL | Status: AC
  Administered 2014-08-11: 1 via NASAL
  Filled 2014-08-11: qty 15

## 2014-08-11 NOTE — Discharge Instructions (Signed)
You can use Afrin in your nose or on your gum if you develop a small amount of bleeding.  You should not use this medication more than 2-3 times per day and for any longer than 3 days.  You should return to the ED if you cannot control the bleeding. Cirrhosis Cirrhosis is a condition of scarring of the liver which is caused when the liver has tried repairing itself following damage. This damage may come from a previous infection such as one of the forms of hepatitis (usually hepatitis C), or the damage may come from being injured by toxins. The main toxin that causes this damage is alcohol. The scarring of the liver from use of alcohol is irreversible. That means the liver cannot return to normal even though alcohol is not used any more. The main danger of hepatitis C infection is that it may cause long-lasting (chronic) liver disease, and this also may lead to cirrhosis. This complication is progressive and irreversible. CAUSES  Prior to available blood tests, hepatitis C could be contracted by blood transfusions. Since testing of blood has improved, this is now unlikely. This infection can also be contracted through intravenous drug use and the sharing of needles. It can also be contracted through sexual relationships. The injury caused by alcohol comes from too much use. It is not a few drinks that poison the liver, but years of misuse. Usually there will be some signs and symptoms early with scarring of the liver that suggest the development of better habits. Alcohol should never be used while using acetaminophen. A small dose of both taken together may cause irreversible damage to the liver. HOME CARE INSTRUCTIONS  There is no specific treatment for cirrhosis. However, there are things you can do to avoid making the condition worse.  Rest as needed.  Eat a well-balanced diet. Your caregiver can help you with suggestions.  Vitamin supplements including vitamins A, K, D, and thiamine can help.  A  low-salt diet, water restriction, or diuretic medicine may be needed to reduce fluid retention.  Avoid alcohol. This can be extremely toxic if combined with acetaminophen.  Avoid drugs which are toxic to the liver. Some of these include isoniazid, methyldopa, acetaminophen, anabolic steroids (muscle-building drugs), erythromycin, and oral contraceptives (birth control pills). Check with your caregiver to make sure medicines you are presently taking will not be harmful.  Periodic blood tests may be required. Follow your caregiver's advice regarding the timing of these.  Milk thistle is an herbal remedy which does protect the liver against toxins. However, it will not help once the liver has been scarred. SEEK MEDICAL CARE IF:  You have increasing fatigue or weakness.  You develop swelling of the hands, feet, legs, or face.  You vomit bright red blood, or a coffee ground appearing material.  You have blood in your stools, or the stools turn black and tarry.  You have a fever.  You develop loss of appetite, or have nausea and vomiting.  You develop jaundice.  You develop easy bruising or bleeding.  You have worsening of any of the problems you are concerned about. Document Released: 11/30/2005 Document Revised: 02/22/2012 Document Reviewed: 07/18/2008 [facility] Patient Information 2015 [place], [place]. This information is not intended to replace advice given to you by your health care provider. Make sure you discuss any questions you have with your health care provider.

## 2014-08-11 NOTE — ED Notes (Signed)
Patient with a history of hepatitis C began bleeding from her gums at 3AM today.  States that this has happened previously.  She denies and rectal bleeding or blood in stool.  Denies hematuria.

## 2014-08-11 NOTE — ED Provider Notes (Addendum)
CSN: [id number]     Arrival date & time 08/11/14  0912 History   First MD Initiated Contact with Patient 08/11/14 0920     Chief Complaint  Patient presents with  . Coagulation Disorder     (Consider location/radiation/quality/duration/timing/severity/associated sxs/prior Treatment) Patient is a 68 y.o. female presenting with general illness.  Illness Location:  L sided gum Quality:  Bleeding Severity:  Moderate Onset quality:  Unable to specify Duration:  15 hours Timing:  Constant Progression:  Unchanged Chronicity:  New Context:  Ho hep C and prior hypocoaguability Relieved by:  Nothing Worsened by:  Nothing Associated symptoms: nausea   Associated symptoms: no abdominal pain, no chest pain, no congestion, no cough, no diarrhea, no fever, no loss of consciousness, no rash, no rhinorrhea, no shortness of breath, no sore throat and no vomiting     Past Medical History  Diagnosis Date  . Hypertension    Past Surgical History  Procedure Laterality Date  . Abdominal hysterectomy     History reviewed. No pertinent family history. History  Substance Use Topics  . Smoking status: Never Smoker   . Smokeless tobacco: Not on file  . Alcohol Use: No   OB History   Grav Para Term Preterm Abortions TAB SAB Ect Mult Living                 Review of Systems  Constitutional: Negative for fever and chills.  HENT: Negative for congestion, rhinorrhea and sore throat.   Eyes: Negative for photophobia and visual disturbance.  Respiratory: Negative for cough and shortness of breath.   Cardiovascular: Negative for chest pain and leg swelling.  Gastrointestinal: Positive for nausea. Negative for vomiting, abdominal pain, diarrhea and constipation.  Endocrine: Negative for polyphagia and polyuria.  Genitourinary: Negative for dysuria, flank pain, vaginal bleeding, vaginal discharge and enuresis.  Musculoskeletal: Negative for back pain and gait problem.  Skin: Negative for color  change and rash.  Neurological: Negative for dizziness, loss of consciousness, syncope, light-headedness and numbness.  Hematological: Negative for adenopathy. Does not bruise/bleed easily.  All other systems reviewed and are negative.     Allergies  Review of patient's allergies indicates no known allergies.  Home Medications   Prior to Admission medications   Medication Sig Start Date End Date Taking? Authorizing Provider  benazepril (LOTENSIN) 10 MG tablet Take 10 mg by mouth daily.   Yes Historical Provider, MD  pantoprazole (PROTONIX) 40 MG tablet Take 40 mg by mouth daily.   Yes Historical Provider, MD   BP 155/73  Pulse 70  Resp 18  SpO2 99% Physical Exam  Vitals reviewed. Constitutional: She is oriented to person, place, and time. She appears well-developed and well-nourished.  HENT:  Head: Normocephalic and atraumatic.  Right Ear: External ear normal.  Left Ear: External ear normal.  Bleeding from gumlines L upper, no acute trauma or punctate hemorrhage  Eyes: Conjunctivae and EOM are normal. Pupils are equal, round, and reactive to light.  Neck: Normal range of motion. Neck supple.  Cardiovascular: Normal rate, regular rhythm, normal heart sounds and intact distal pulses.   Pulmonary/Chest: Effort normal and breath sounds normal.  Abdominal: Soft. Bowel sounds are normal. There is no tenderness.  Musculoskeletal: Normal range of motion.  Neurological: She is alert and oriented to person, place, and time.  Skin: Skin is warm and dry.    ED Course  Procedures (including critical care time) Labs Review Labs Reviewed  CBC WITH DIFFERENTIAL - Abnormal; Notable for  the following:    Hemoglobin 11.9 (*)    HCT 35.1 (*)    RDW 17.5 (*)    Platelets 59 (*)    Neutrophils Relative % 25 (*)    Neutro Abs 1.4 (*)    Lymphocytes Relative 53 (*)    Monocytes Relative 16 (*)    All other components within normal limits  COMPREHENSIVE METABOLIC PANEL - Abnormal;  Notable for the following:    Glucose, Bld 101 (*)    Calcium 7.9 (*)    Albumin 2.1 (*)    AST 178 (*)    ALT 92 (*)    Alkaline Phosphatase 146 (*)    Total Bilirubin 1.9 (*)    GFR calc non Af Amer 87 (*)    All other components within normal limits  PROTIME-INR - Abnormal; Notable for the following:    Prothrombin Time 19.3 (*)    INR 1.63 (*)    All other components within normal limits    Imaging Review No results found.   EKG Interpretation None      MDM   Final diagnoses:  Gums, bleeding  Cirrhosis of liver without ascites, unspecified hepatic cirrhosis type    68 y.o. female  with pertinent PMH of Hep C presents with atraumatic gum bleeding since 3 am day of visit.  No fevers, cough, urinary, or other symptoms.  Physical exam as above.  Pt has been seen in the past, with different last name,  Evie, [place].  Labs unchanged from 2 weeks ago.  Pt had resolution of bleeding spontaneously here.  Likely related to chronic cirrhosis and hepatitis.  No vomiting to suggest cirrhotic bleed and source of bleeding was evident. Pt stable to dc home with GI fu asap, she already has an appointment 9/9.  She was given standard return precautions, voiced understanding, and will fu.  .    Labs and imaging as above reviewed.   1. Gums, bleeding   2. Cirrhosis of liver without ascites, unspecified hepatic cirrhosis type         Debby Freiberg, MD 08/11/14 1330  Addended for clarity of physical documentation  Debby Freiberg, MD 08/11/14 1331

## 2014-08-24 ENCOUNTER — Encounter: Payer: Self-pay | Admitting: Family Medicine

## 2014-08-24 DIAGNOSIS — I10 Essential (primary) hypertension: Secondary | ICD-10-CM | POA: Insufficient documentation

## 2014-08-24 DIAGNOSIS — F329 Major depressive disorder, single episode, unspecified: Secondary | ICD-10-CM

## 2014-08-24 DIAGNOSIS — M1990 Unspecified osteoarthritis, unspecified site: Secondary | ICD-10-CM | POA: Insufficient documentation

## 2014-08-24 DIAGNOSIS — N289 Disorder of kidney and ureter, unspecified: Secondary | ICD-10-CM

## 2014-08-24 DIAGNOSIS — R17 Unspecified jaundice: Secondary | ICD-10-CM | POA: Insufficient documentation

## 2014-08-24 DIAGNOSIS — F32A Depression, unspecified: Secondary | ICD-10-CM | POA: Insufficient documentation

## 2014-08-24 DIAGNOSIS — R32 Unspecified urinary incontinence: Secondary | ICD-10-CM

## 2014-08-24 DIAGNOSIS — N200 Calculus of kidney: Secondary | ICD-10-CM | POA: Insufficient documentation

## 2014-08-24 DIAGNOSIS — K759 Inflammatory liver disease, unspecified: Secondary | ICD-10-CM | POA: Insufficient documentation

## 2014-08-24 HISTORY — DX: Unspecified osteoarthritis, unspecified site: M19.90

## 2014-08-24 HISTORY — DX: Disorder of kidney and ureter, unspecified: N28.9

## 2014-08-24 HISTORY — DX: Depression, unspecified: F32.A

## 2014-10-06 DIAGNOSIS — M1712 Unilateral primary osteoarthritis, left knee: Secondary | ICD-10-CM | POA: Diagnosis not present

## 2014-10-15 ENCOUNTER — Other Ambulatory Visit (INDEPENDENT_AMBULATORY_CARE_PROVIDER_SITE_OTHER): Payer: Medicare Other

## 2014-10-15 DIAGNOSIS — R933 Abnormal findings on diagnostic imaging of other parts of digestive tract: Secondary | ICD-10-CM

## 2014-10-15 DIAGNOSIS — K7460 Unspecified cirrhosis of liver: Secondary | ICD-10-CM

## 2014-10-15 LAB — COMPREHENSIVE METABOLIC PANEL
ALBUMIN: 2 g/dL — AB (ref 3.5–5.2)
ALT: 66 U/L — ABNORMAL HIGH (ref 0–35)
AST: 124 U/L — AB (ref 0–37)
Alkaline Phosphatase: 124 U/L — ABNORMAL HIGH (ref 39–117)
BUN: 15 mg/dL (ref 6–23)
CO2: 24 mEq/L (ref 19–32)
Calcium: 8.1 mg/dL — ABNORMAL LOW (ref 8.4–10.5)
Chloride: 108 mEq/L (ref 96–112)
Creatinine, Ser: 0.6 mg/dL (ref 0.4–1.2)
GFR: 119.92 mL/min (ref >60.00)
Glucose, Bld: 94 mg/dL (ref 70–99)
POTASSIUM: 3.6 meq/L (ref 3.5–5.1)
SODIUM: 136 meq/L (ref 135–145)
Total Bilirubin: 2.4 mg/dL — ABNORMAL HIGH (ref 0.2–1.2)
Total Protein: 6.7 g/dL (ref 6.0–8.3)

## 2014-10-22 ENCOUNTER — Other Ambulatory Visit: Payer: Self-pay | Admitting: Nurse Practitioner

## 2014-10-22 ENCOUNTER — Ambulatory Visit (HOSPITAL_COMMUNITY)
Admission: RE | Admit: 2014-10-22 | Discharge: 2014-10-22 | Disposition: A | Discharge status: Home / Self Care | Payer: Medicare Other | Source: Ambulatory Visit | Attending: Nurse Practitioner | Admitting: Nurse Practitioner

## 2014-10-22 DIAGNOSIS — K7460 Unspecified cirrhosis of liver: Secondary | ICD-10-CM | POA: Insufficient documentation

## 2014-10-22 DIAGNOSIS — R160 Hepatomegaly, not elsewhere classified: Secondary | ICD-10-CM | POA: Diagnosis not present

## 2014-10-22 DIAGNOSIS — R933 Abnormal findings on diagnostic imaging of other parts of digestive tract: Secondary | ICD-10-CM

## 2014-10-22 DIAGNOSIS — K766 Portal hypertension: Secondary | ICD-10-CM | POA: Diagnosis not present

## 2014-10-22 DIAGNOSIS — K8020 Calculus of gallbladder without cholecystitis without obstruction: Secondary | ICD-10-CM | POA: Insufficient documentation

## 2014-10-22 DIAGNOSIS — K769 Liver disease, unspecified: Secondary | ICD-10-CM | POA: Diagnosis present

## 2014-10-22 MED ORDER — GADOXETATE DISODIUM 0.25 MMOL/ML IV SOLN
7.0000 mL | Freq: Once | INTRAVENOUS | Status: AC | PRN
  Administered 2014-10-22: 7 mL via INTRAVENOUS

## 2014-10-23 DIAGNOSIS — M170 Bilateral primary osteoarthritis of knee: Secondary | ICD-10-CM | POA: Diagnosis not present

## 2014-10-24 ENCOUNTER — Other Ambulatory Visit: Payer: Self-pay | Admitting: Orthopedic Surgery

## 2014-11-01 DIAGNOSIS — R188 Other ascites: Secondary | ICD-10-CM | POA: Diagnosis not present

## 2014-11-01 DIAGNOSIS — K7460 Unspecified cirrhosis of liver: Secondary | ICD-10-CM | POA: Diagnosis not present

## 2014-11-01 DIAGNOSIS — K8020 Calculus of gallbladder without cholecystitis without obstruction: Secondary | ICD-10-CM | POA: Diagnosis not present

## 2014-11-01 DIAGNOSIS — K766 Portal hypertension: Secondary | ICD-10-CM | POA: Diagnosis not present

## 2014-11-13 ENCOUNTER — Other Ambulatory Visit: Payer: Self-pay | Admitting: Orthopedic Surgery

## 2014-11-13 DIAGNOSIS — K7469 Other cirrhosis of liver: Secondary | ICD-10-CM | POA: Diagnosis not present

## 2014-11-13 DIAGNOSIS — B182 Chronic viral hepatitis C: Secondary | ICD-10-CM | POA: Diagnosis not present

## 2014-11-13 DIAGNOSIS — K7689 Other specified diseases of liver: Secondary | ICD-10-CM | POA: Diagnosis not present

## 2014-11-13 DIAGNOSIS — Z79899 Other long term (current) drug therapy: Secondary | ICD-10-CM | POA: Diagnosis not present

## 2014-11-14 MED ORDER — TRANEXAMIC ACID 100 MG/ML IV SOLN: 1000.0000 mg | INTRAVENOUS | Status: AC

## 2014-11-22 ENCOUNTER — Other Ambulatory Visit (HOSPITAL_COMMUNITY): Payer: Medicare Other

## 2014-11-27 DIAGNOSIS — M170 Bilateral primary osteoarthritis of knee: Secondary | ICD-10-CM | POA: Diagnosis not present

## 2014-11-30 ENCOUNTER — Encounter (HOSPITAL_COMMUNITY): Admission: RE | Payer: Self-pay | Source: Ambulatory Visit

## 2014-11-30 ENCOUNTER — Inpatient Hospital Stay (HOSPITAL_COMMUNITY): Admission: RE | Admit: 2014-11-30 | Payer: Medicare Other | Source: Ambulatory Visit | Admitting: Orthopedic Surgery

## 2014-11-30 SURGERY — ARTHROPLASTY, KNEE, TOTAL
Anesthesia: General | Site: Knee | Laterality: Left

## 2015-01-02 DIAGNOSIS — K5900 Constipation, unspecified: Secondary | ICD-10-CM | POA: Diagnosis not present

## 2015-01-02 DIAGNOSIS — R109 Unspecified abdominal pain: Secondary | ICD-10-CM | POA: Diagnosis not present

## 2015-01-02 DIAGNOSIS — R10827 Generalized rebound abdominal tenderness: Secondary | ICD-10-CM | POA: Diagnosis not present

## 2015-01-16 DIAGNOSIS — K8020 Calculus of gallbladder without cholecystitis without obstruction: Secondary | ICD-10-CM | POA: Diagnosis not present

## 2015-01-16 DIAGNOSIS — K7689 Other specified diseases of liver: Secondary | ICD-10-CM | POA: Diagnosis not present

## 2015-01-16 DIAGNOSIS — K7460 Unspecified cirrhosis of liver: Secondary | ICD-10-CM | POA: Diagnosis not present

## 2015-01-16 DIAGNOSIS — K766 Portal hypertension: Secondary | ICD-10-CM | POA: Diagnosis not present

## 2015-01-16 DIAGNOSIS — Z79899 Other long term (current) drug therapy: Secondary | ICD-10-CM | POA: Diagnosis not present

## 2015-01-21 ENCOUNTER — Ambulatory Visit (INDEPENDENT_AMBULATORY_CARE_PROVIDER_SITE_OTHER): Payer: Medicare Other | Admitting: Family Medicine

## 2015-01-21 ENCOUNTER — Encounter: Payer: Self-pay | Admitting: Family Medicine

## 2015-01-21 DIAGNOSIS — I10 Essential (primary) hypertension: Secondary | ICD-10-CM

## 2015-01-21 DIAGNOSIS — K7469 Other cirrhosis of liver: Secondary | ICD-10-CM

## 2015-01-21 DIAGNOSIS — B1920 Unspecified viral hepatitis C without hepatic coma: Secondary | ICD-10-CM | POA: Insufficient documentation

## 2015-01-21 DIAGNOSIS — F329 Major depressive disorder, single episode, unspecified: Secondary | ICD-10-CM | POA: Diagnosis not present

## 2015-01-21 DIAGNOSIS — B182 Chronic viral hepatitis C: Secondary | ICD-10-CM

## 2015-01-21 DIAGNOSIS — F32A Depression, unspecified: Secondary | ICD-10-CM

## 2015-01-21 DIAGNOSIS — K7460 Unspecified cirrhosis of liver: Secondary | ICD-10-CM

## 2015-01-21 HISTORY — DX: Unspecified viral hepatitis C without hepatic coma: B19.20

## 2015-01-21 HISTORY — DX: Unspecified cirrhosis of liver: K74.60

## 2015-01-21 NOTE — Patient Instructions (Addendum)
Sign release of information at the front desk for Dr. Marty Heck from [facility] and from your heart doctor.   Call about 2 weeks from now to see if we have records  If we do, schedule an appointment. I would like to focus on your depression once we have records.

## 2015-01-21 NOTE — Progress Notes (Signed)
Heidi Reddish, MD Phone: [phone]  Subjective:  Patient presents today to establish care. Chief complaint-noted.  Came to [place] from [place] 3 years ago. Sees a cardiologist Dr. Darnell Booker.  Patient declines interpreter and chooses to use son.   She has Hepatitis C and has had for at least 10 years. Reports undergoing treatment currently though not clear. Has cirrhosis as a result treated with lasix 20mg  daily and nadolol to prevent esophageal varices bleeding. She had had depressed emotions for some time dealing with daughter and herself being sick. It is not clear what discussions she has had with other doctors about this. She sees a cardiologist as well as Dr. Patsy Booker of [facility] related to cirrhosis/Hep C. She also has a history of hypertension controlled on nadolol, formerly on benicar.   ROS-HA and nose bleeds at times. HA for years concerned about sinuses as mostly related to sinus drainage. She reports Fungus in toenails as well. No chest pain or shortness of breath. Endorses anhedonia but no SI/HI.  otherwise full ROS was completed and negative except as noted above  The following were reviewed and entered/updated in epic: Past Medical History  Diagnosis Date  . Hypertension   . 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 From Hep C.  Xifaxin causesd severe abdominal pain. Was given to reduce confusion from cirrhosis.    . Hepatitis C 01/21/2015    Under care Dr. Burnetta Booker. Starting to treat hep c per patient.    . Depression 08/24/2014    Has a sister that is very sick and dealing with stress of this   . Nephrolithiasis 08/24/2014  . Arthritis 08/24/2014    Knees and ankles    Patient Active Problem List   Diagnosis Date Noted  . Hepatic cirrhosis 01/21/2015    Priority: High  . Hepatitis C 01/21/2015    Priority: High  . Depression 08/24/2014    Priority: Medium  . Hypertension 08/24/2014    Priority: Medium  . Arthritis 08/24/2014   Priority: Low  . Nephrolithiasis 08/24/2014    Priority: Low  . Urine incontinence 08/24/2014   Past Surgical History  Procedure Laterality Date  . Abdominal hysterectomy    . Knee surgery      "removed something"  . Tonsilectomy/adenoidectomy with myringotomy      Family History  Problem Relation Age of Onset  . Pulmonary fibrosis Sister   . Kidney disease Sister     Medications- reviewed and updated Current Outpatient Prescriptions  Medication Sig Dispense Refill  . furosemide (LASIX) 20 MG tablet Take 20 mg by mouth.    . nadolol (CORGARD) 20 MG tablet Take 20 mg by mouth daily.    Heidi Booker ONDANSETRON HCL PO Take 4 mg by mouth.     No current facility-administered medications for this visit.    Allergies-reviewed and updated No Known Allergies  History   Social History  . Marital Status: Unknown    Spouse Name: N/A    Number of Children: N/A  . Years of Education: N/A   Social History Main Topics  . Smoking status: Never Smoker   . Smokeless tobacco: None  . Alcohol Use: No     Comment: quit 24 years ago  . Drug Use: No  . Sexual Activity: No   Other Topics Concern  . None   Social History Narrative   Lives with son, 3 daughters. 4 children (lost 3 boys in childbirth). 3 grandkids.  Originally from [place]      Retired, stays at home.       Hobbies: stays at home   Objective: BP 122/68 mmHg  Temp(Src) 98.1 F (36.7 C)  Ht 4\' 9"  (1.448 m)  Wt 167 lb (75.751 kg)  BMI 36.13 kg/m2 Gen: NAD, resting comfortably in chair Requires significant assist to get on table (states L knee pain) HEENT: Mucous membranes are moist. Oropharynx normal.  Eyes: sclera and lids normal (no clear icterus), PERRLA Neck: no thyromegaly, no cervical lymphadenopathy CV: RRR no murmurs rubs or gallops Lungs: CTAB no crackles, wheeze, rhonchi Abdomen: soft/nontender/normal bowel sounds. Protuberant with hepatomegaly (difficult exam) and fluid wave noted  Ext: trace  pitting edema Skin: warm, dry, no rash Neuro: 5/5 strength in upper and lower extremities, normal gait, normal reflexes   Assessment/Plan:  Hepatic cirrhosis Lasix 20mg , nadolol to prevent varices from bleeding. From Hep C.  Xifaxin causesd severe abdominal pain.  Was given to reduce hepatic encephalopathy.  No current prophylaxis.  Obtain records.     Hepatitis C Under care Dr. Burnetta Booker. Starting to treat hep c per patient. Obtain records.    Hypertension Controlled on nadolol alone   Depression Obtain records and see if on any prior treatments. No SI/HI reported but think wouldbenefit from therapy if in spanish or medication otherwise. This is primary driving force of close follow up.     Return precautions advised. Follow up as per AVS within a few weeks of obtaining records

## 2015-01-21 NOTE — Assessment & Plan Note (Signed)
Obtain records and see if on any prior treatments. No SI/HI reported but think wouldbenefit from therapy if in spanish or medication otherwise. This is primary driving force of close follow up.

## 2015-01-21 NOTE — Assessment & Plan Note (Addendum)
Lasix 20mg , nadolol to prevent varices from bleeding. From Hep C.  Xifaxin causesd severe abdominal pain.  Was given to reduce hepatic encephalopathy.  No current prophylaxis.  Obtain records.

## 2015-01-21 NOTE — Assessment & Plan Note (Signed)
Under care Dr. Burnetta Sabin. Starting to treat hep c per patient. Obtain records.

## 2015-01-21 NOTE — Assessment & Plan Note (Signed)
Controlled on nadolol alone

## 2015-02-04 DIAGNOSIS — R300 Dysuria: Secondary | ICD-10-CM | POA: Diagnosis not present

## 2015-02-04 DIAGNOSIS — K7469 Other cirrhosis of liver: Secondary | ICD-10-CM | POA: Diagnosis not present

## 2015-02-04 DIAGNOSIS — B182 Chronic viral hepatitis C: Secondary | ICD-10-CM | POA: Diagnosis not present

## 2015-02-04 DIAGNOSIS — K7689 Other specified diseases of liver: Secondary | ICD-10-CM | POA: Diagnosis not present

## 2015-03-07 ENCOUNTER — Encounter: Payer: Self-pay | Admitting: Family Medicine

## 2015-03-07 ENCOUNTER — Ambulatory Visit (INDEPENDENT_AMBULATORY_CARE_PROVIDER_SITE_OTHER): Payer: Medicare Other | Admitting: Family Medicine

## 2015-03-07 VITALS — BP 120/70 | HR 64 | Temp 97.9°F | Wt 162.0 lb

## 2015-03-07 DIAGNOSIS — R1084 Generalized abdominal pain: Secondary | ICD-10-CM

## 2015-03-07 LAB — POCT URINALYSIS DIPSTICK
Bilirubin, UA: NEGATIVE
Blood, UA: NEGATIVE
Glucose, UA: NEGATIVE
KETONES UA: NEGATIVE
LEUKOCYTES UA: NEGATIVE
Nitrite, UA: NEGATIVE
PROTEIN UA: NEGATIVE
Spec Grav, UA: 1.015
UROBILINOGEN UA: 0.2
pH, UA: 7

## 2015-03-07 LAB — COMPREHENSIVE METABOLIC PANEL
ALBUMIN: 2.4 g/dL — AB (ref 3.5–5.2)
ALT: 70 U/L — AB (ref 0–35)
AST: 116 U/L — ABNORMAL HIGH (ref 0–37)
Alkaline Phosphatase: 123 U/L — ABNORMAL HIGH (ref 39–117)
BILIRUBIN TOTAL: 1.7 mg/dL — AB (ref 0.2–1.2)
BUN: 21 mg/dL (ref 6–23)
CALCIUM: 8.5 mg/dL (ref 8.4–10.5)
CHLORIDE: 109 meq/L (ref 96–112)
CO2: 28 meq/L (ref 19–32)
Creatinine, Ser: 0.9 mg/dL (ref 0.40–1.20)
GFR: 65.59 mL/min (ref >60.00)
GLUCOSE: 82 mg/dL (ref 70–99)
POTASSIUM: 4.8 meq/L (ref 3.5–5.1)
Sodium: 138 mEq/L (ref 135–145)
TOTAL PROTEIN: 7.4 g/dL (ref 6.0–8.3)

## 2015-03-07 LAB — CBC WITH DIFFERENTIAL/PLATELET
Basophils Absolute: 0 10*3/uL (ref 0.0–0.1)
Basophils Relative: 0.6 % (ref 0.0–3.0)
EOS PCT: 3.1 % (ref 0.0–5.0)
Eosinophils Absolute: 0.2 10*3/uL (ref 0.0–0.7)
HCT: 38.8 % (ref 36.0–46.0)
Hemoglobin: 12.9 g/dL (ref 12.0–15.0)
LYMPHS PCT: 47.1 % — AB (ref 12.0–46.0)
Lymphs Abs: 3.3 10*3/uL (ref 0.7–4.0)
MCHC: 33.1 g/dL (ref 30.0–36.0)
MCV: 88.6 fl (ref 78.0–100.0)
Monocytes Absolute: 1.3 10*3/uL — ABNORMAL HIGH (ref 0.1–1.0)
Monocytes Relative: 18.2 % — ABNORMAL HIGH (ref 3.0–12.0)
NEUTROS PCT: 31 % — AB (ref 43.0–77.0)
Neutro Abs: 2.2 10*3/uL (ref 1.4–7.7)
Platelets: 60 10*3/uL — ABNORMAL LOW (ref 150.0–400.0)
RBC: 4.38 Mil/uL (ref 3.87–5.11)
RDW: 17.6 % — ABNORMAL HIGH (ref 11.5–15.5)
WBC: 7.1 10*3/uL (ref 4.0–10.5)

## 2015-03-07 LAB — LIPASE: Lipase: 124 U/L — ABNORMAL HIGH (ref 11.0–59.0)

## 2015-03-07 NOTE — Patient Instructions (Addendum)
Call to get mammogram scheduled  Stop at front desk to request [facility] to send [place] most recent records  I am ordering STAT labs-we will try to call you by end of the day with results and if you will need further care over weekend.   This could be a stomach bug.   Return to see me on Monday  If you had fevers, worsening abdominal pain, shortness of breath, uncontrollable vomiting where you could not stay hydrated-seek care over the weekend

## 2015-03-07 NOTE — Progress Notes (Signed)
Garret Reddish, MD Phone: [phone]  Subjective:   Heidi Booker is a 69 y.o. year old very pleasant female patient who presents with the following:  Requests to use family member for interpretation. She refuses to have medical interpreter in place. She feels odd having someone in the room or on phone. Discussed we needed this to get more information on her symptoms.   Abdominal Pain L sided headache, snotty bloody nose x 4 days. Slight cough. Also has history of headaches and from older records-was told needed PCP to help manage headaches/work up. Stomach aching and nausea. Pain in epigastric area. Has never had pain like this before. Burping helps the pain ease off. Pain up to 8/10, down to 6/10 with burping. Seems to be slowly getting worse. No treatments tried. Decreased appetite. Unable to tell me what Hep C treatments have been completed. Do not have records from [facility] as requested previously. Cannot access records from [facility] through care everywhere due to different names.   Walks with walker and requests handicap placard.   On benazepril 10mg  in AM- unclear who prescribed this, not hepatology clinic.   ROS- no vomiting, has had some dry heaves. No fever. No edema. No chest pain or shortness of breath. Denies exertional symptoms.   Past Medical History- Patient Active Problem List   Diagnosis Date Noted  . Hepatic cirrhosis 01/21/2015    Priority: High  . Hepatitis C 01/21/2015    Priority: High  . Depression 08/24/2014    Priority: Medium  . Hypertension 08/24/2014    Priority: Medium  . Arthritis 08/24/2014    Priority: Low  . Nephrolithiasis 08/24/2014    Priority: Low  . Urine incontinence 08/24/2014    Priority: Low   Medications- reviewed and updated Current Outpatient Prescriptions  Medication Sig Dispense Refill  . benazepril (LOTENSIN) 10 MG tablet Take 10 mg by mouth daily.    . furosemide (LASIX) 20 MG tablet Take 20 mg by mouth.    . nadolol  (CORGARD) 20 MG tablet Take 20 mg by mouth daily.    Marland Kitchen spironolactone (ALDACTONE) 50 MG tablet Take 50 mg by mouth daily.     No current facility-administered medications for this visit.    Objective: BP 120/70 mmHg  Pulse 64  Temp(Src) 97.9 F (36.6 C)  Wt 162 lb (73.483 kg) Gen: NAD, appears fiatuged CV: RRR no murmurs rubs or gallops Lungs: CTAB no crackles, wheeze, rhonchi Abdomen: soft/nondistended/normal bowel sounds. No rebound or guarding. Diffusely tender but distractible. Area that hurts each time I reexamine stomach.  Ext: 1+ edema Skin: warm, dry, no rash   Assessment/Plan:  Abdominal pain (epigastric reported, diffuse on exam) with nausea, dry heaves Labs largely stable from baseline (known low platelets, bili and LFTs stable). UA negative. Lipase is mildly elevated and this could be a mild case of pancreatitis. Called patient and advised resting bowels except for liquids and following up with me on Monday. With stable labs, I am less likely to order CT unless significant worsening. Return precautions advised and reasons to go to ED   Headache, snotty nose Has a history of headaches and hard to tease out this history, possible with stress of above and likely mild dehydration  more prone to headaches. Also possible sinus infection but history difficult to obtain as stated above HPI. Regardless 4 days of illness would not require antibiotics.   Follow up Monday  Results for orders placed or performed in visit on 03/07/15 (from the past  24 hour(s))  CBC with Differential/Platelet     Status: Abnormal   Collection Time: 03/07/15 11:58 AM  Result Value Ref Range   WBC 7.1 4.0 - 10.5 K/uL   RBC 4.38 3.87 - 5.11 Mil/uL   Hemoglobin 12.9 12.0 - 15.0 g/dL   HCT 38.8 36.0 - 46.0 %   MCV 88.6 78.0 - 100.0 fl   MCHC 33.1 30.0 - 36.0 g/dL   RDW 17.6 (H) 11.5 - 15.5 %   Platelets 60.0 (L) 150.0 - 400.0 K/uL   Neutrophils Relative % 31.0 (L) 43.0 - 77.0 %   Lymphocytes  Relative 47.1 (H) 12.0 - 46.0 %   Monocytes Relative 18.2 Repeated and verified X2. (H) 3.0 - 12.0 %   Eosinophils Relative 3.1 0.0 - 5.0 %   Basophils Relative 0.6 0.0 - 3.0 %   Neutro Abs 2.2 1.4 - 7.7 K/uL   Lymphs Abs 3.3 0.7 - 4.0 K/uL   Monocytes Absolute 1.3 (H) 0.1 - 1.0 K/uL   Eosinophils Absolute 0.2 0.0 - 0.7 K/uL   Basophils Absolute 0.0 0.0 - 0.1 K/uL  Comprehensive metabolic panel     Status: Abnormal   Collection Time: 03/07/15 11:58 AM  Result Value Ref Range   Sodium 138 135 - 145 mEq/L   Potassium 4.8 3.5 - 5.1 mEq/L   Chloride 109 96 - 112 mEq/L   CO2 28 19 - 32 mEq/L   Glucose, Bld 82 70 - 99 mg/dL   BUN 21 6 - 23 mg/dL   Creatinine, Ser 0.90 0.40 - 1.20 mg/dL   Total Bilirubin 1.7 (H) 0.2 - 1.2 mg/dL   Alkaline Phosphatase 123 (H) 39 - 117 U/L   AST 116 (H) 0 - 37 U/L   ALT 70 (H) 0 - 35 U/L   Total Protein 7.4 6.0 - 8.3 g/dL   Albumin 2.4 (L) 3.5 - 5.2 g/dL   Calcium 8.5 8.4 - 10.5 mg/dL   GFR 65.59 >60.00 mL/min  Lipase     Status: Abnormal   Collection Time: 03/07/15 11:58 AM  Result Value Ref Range   Lipase 124.0 (H) 11.0 - 59.0 U/L  POCT urinalysis dipstick     Status: None   Collection Time: 03/07/15  1:43 PM  Result Value Ref Range   Color, UA yellow    Clarity, UA clear    Glucose, UA n    Bilirubin, UA n    Ketones, UA n    Spec Grav, UA 1.015    Blood, UA n    pH, UA 7.0    Protein, UA n    Urobilinogen, UA 0.2    Nitrite, UA n    Leukocytes, UA Negative

## 2015-03-11 ENCOUNTER — Encounter: Payer: Self-pay | Admitting: Family Medicine

## 2015-03-11 ENCOUNTER — Ambulatory Visit (INDEPENDENT_AMBULATORY_CARE_PROVIDER_SITE_OTHER): Payer: Medicare Other | Admitting: Family Medicine

## 2015-03-11 VITALS — BP 120/70 | Temp 98.2°F | Resp 16 | Ht 62.0 in | Wt 156.0 lb

## 2015-03-11 DIAGNOSIS — R1084 Generalized abdominal pain: Secondary | ICD-10-CM

## 2015-03-11 DIAGNOSIS — K5900 Constipation, unspecified: Secondary | ICD-10-CM

## 2015-03-11 NOTE — Progress Notes (Addendum)
  Garret Reddish, MD Phone: [phone]  Subjective:   Heidi Booker is a 69 y.o. year old very pleasant female patient who presents with the following:  Once again "Requests to use family member for interpretation. She refuses to have medical interpreter in place. She feels odd having someone in the room or on phone. Discussed we needed this to get more information on her symptoms. "  Abdominal Pain-improved Constipation-poor control Seen last Thursday and had abdominal pain mainly epigastric but also diffuse. With history of hep C and cirrhosis we obtained labs which were largely stable. Lipase was slightly above 100. I had her drink primarily liquids over the weekend for potential pancreatitis.   Today, patient reports she had multiple formed bowel movements over the weekend and abdominal pain slowly resolved. she was compliant with a primarily liquid diet. She held off on medication but spironolactone and I had not encouraged her to stop any medications. She had previously denied constipation but today admits she has hard stools though they are every morning.  ROS- no vomiting, no more dry heaves. No fever. Slight edema. No chest pain or shortness of breath. Will occasionally note a blood tinge when she sneezes   Past Medical History- Patient Active Problem List   Diagnosis Date Noted  . Hepatic cirrhosis 01/21/2015    Priority: High  . Hepatitis C 01/21/2015    Priority: High  . Depression 08/24/2014    Priority: Medium  . Hypertension 08/24/2014    Priority: Medium  . Arthritis 08/24/2014    Priority: Low  . Nephrolithiasis 08/24/2014    Priority: Low  . Urine incontinence 08/24/2014    Priority: Low   Medications- reviewed and updated Current Outpatient Prescriptions  Medication Sig Dispense Refill  . spironolactone (ALDACTONE) 50 MG tablet Take 50 mg by mouth daily.    . furosemide (LASIX) 20 MG tablet Take 20 mg by mouth.    . nadolol (CORGARD) 20 MG  tablet Take 20 mg by mouth daily.    Also taking benazepril 10mg   Objective: BP 120/70 mmHg  Temp(Src) 98.2 F (36.8 C) (Oral)  Resp 16  Ht 5\' 2"  (1.575 m)  Wt 156 lb (70.761 kg)  BMI 28.53 kg/m2 Gen: NAD, appears comfortable CV: RRR no murmurs rubs or gallops Lungs: CTAB no crackles, wheeze, rhonchi Abdomen: soft/nondistended/normal bowel sounds. No rebound or guarding. Nontender.  Ext: 1+ edema largely unchanged Skin: warm, dry, no rash   Assessment/Plan:  Abdominal pain (epigastric reported, diffuse on exam) with nausea, dry heaves-improved Constipation-improved Resolved with liquid diet over the weekend. Lipase was slightly elevated and may have been mild pancreatitis. Also had several formed bowel movements and may have been constipated. We opted to treat with miralax 1/2 capful everyday for prevention. Liberalized diet with slow gradual progression being best option.   Also blood pressure was controlled today on spironolactone alone. Asked patient to stop benazepril and continue nadolol and lasix. We will see her back in 1 month for repeat blood pressure evaluation.   Follow up 1 month.

## 2015-03-11 NOTE — Patient Instructions (Signed)
Stop benazepril. Continue all medicine on this page.   See me in 1 month for recheck blood pressure and potentially to discuss your depression.   I think your abdominal pain was primarily due to constipation. Let's have you take a 1/2 capful of miralax on a daily basis mixed in with water. Hopefully this will help your stools be less hard and keep you from getting backed up.

## 2015-03-11 NOTE — Progress Notes (Signed)
Pre visit review using our clinic review tool, if applicable. No additional management support is needed unless otherwise documented below in the visit note. 

## 2015-03-14 DIAGNOSIS — B349 Viral infection, unspecified: Secondary | ICD-10-CM | POA: Diagnosis not present

## 2015-03-14 DIAGNOSIS — R05 Cough: Secondary | ICD-10-CM | POA: Diagnosis not present

## 2015-04-12 ENCOUNTER — Ambulatory Visit (INDEPENDENT_AMBULATORY_CARE_PROVIDER_SITE_OTHER): Payer: Medicare Other | Admitting: Family Medicine

## 2015-04-12 ENCOUNTER — Encounter: Payer: Self-pay | Admitting: Family Medicine

## 2015-04-12 VITALS — BP 110/78 | HR 55 | Temp 97.8°F | Wt 161.0 lb

## 2015-04-12 DIAGNOSIS — R1084 Generalized abdominal pain: Secondary | ICD-10-CM | POA: Diagnosis not present

## 2015-04-12 DIAGNOSIS — I10 Essential (primary) hypertension: Secondary | ICD-10-CM | POA: Diagnosis not present

## 2015-04-12 DIAGNOSIS — M25511 Pain in right shoulder: Secondary | ICD-10-CM

## 2015-04-12 DIAGNOSIS — K5900 Constipation, unspecified: Secondary | ICD-10-CM | POA: Insufficient documentation

## 2015-04-12 NOTE — Progress Notes (Signed)
Garret Reddish, MD Subjective:   Heidi Booker is a 69 y.o. year old very pleasant female patient who presents with:  Abdominal pain and constipation- stable-about a month ago was having generalized abdominal pain and over weekend had multiple formed bowel movements and pain resolved. Previously had hard stools every morning. Lipase had been slightly up at that time and she had a liquid diet over weekend which may have helped as well.   Had colonoscopy about 1.5 years ago in [place]. Once started all of the medicine, bowel movements slowed down. In [place], no pills ROS-  Hypertension-controlled on nadolol, lasix, spironolactone. Stopped benazepril  BP Readings from Last 3 Encounters:  04/12/15 110/78  03/11/15 120/70  03/07/15 120/70  ROS-Denies any CP, SOB, blurry vision.   R Shoulder pain For many months. Trouble sleeping on it or lifting the arm due to shoulder pain. Requests ortho referral. Reports an injection in lower arm in the past.   Past Medical History- Hep C with cirrhosis  Medications- reviewed and updated Current Outpatient Prescriptions  Medication Sig Dispense Refill  . furosemide (LASIX) 20 MG tablet Take 20 mg by mouth.    . nadolol (CORGARD) 20 MG tablet Take 20 mg by mouth daily.    Marland Kitchen spironolactone (ALDACTONE) 50 MG tablet Take 50 mg by mouth daily.     No current facility-administered medications for this visit.    Objective: BP 110/78 mmHg  Pulse 55  Temp(Src) 97.8 F (36.6 C)  Wt 161 lb (73.029 kg) Gen: NAD, resting comfortably CV: RRR no murmurs rubs or gallops Lungs: CTAB no crackles, wheeze, rhonchi Abdomen: soft/nontender/nondistended/normal bowel sounds. No rebound or guarding.  Ext: stable 1+ edema Skin: warm, dry, no rash   Right Shoulder: Inspection reveals no abnormalities, atrophy or asymmetry. Palpation is normal with no tenderness over AC joint or bicipital groove. ROM is limited by pain (though not clearly  frozen) Rotator cuff strength limited by pain Signs of impingement with positive Neer and Hawkin's tests, empty can. Painful arc noted.   Assessment/Plan:  Hypertension Controlled on nadolol 20mg , spironolactone 50mg , lasix 20mg  as needed for BP and cirrhosis.    Constipation [place] colonoscopy 2014, constipation since moving to [place] and starting meds for Hep C, cirrhosis, BP. Even if we got records, could not interpret likely so will hold off. If had thin stools or progressive symptoms consider repeat colonoscopy  Intermittent abdominal pain when she becomes constipated (when she stops miralax for several weeks), resolves on miralax. We discussed trialing 1/2 capful every other day dosing and increasing fiber in diet.    Right shoulder pain Offered return visit for diagnotic and therapeutic steroid injection. Would avoid nsaids. Patient prefers ortho visit and referred at this time.    3 month advised verbally. Has [facility] follow up in mid May including MRI abdomen to monitor liver per patient.   Orders Placed This Encounter  Procedures  . Ambulatory referral to Orthopedic Surgery    Referral Priority:  Routine    Referral Type:  Surgical    Referral Reason:  Specialty Services Required    Requested Specialty:  Orthopedic Surgery    Number of Visits Requested:  1

## 2015-04-12 NOTE — Patient Instructions (Signed)
Blood pressure looks great  Sorry the abdominal pain comes back when off miralax. Let's take half capful every other day and work on the fruits and veggies being increased in diet. If still constipated, may take 1/2 capful everyday  We will call you within a week about your referral to orthopedics for R shoulder pain. If you do not hear within 2 weeks, give [place] a call.

## 2015-04-12 NOTE — Assessment & Plan Note (Signed)
Offered return visit for diagnotic and therapeutic steroid injection. Would avoid nsaids. Patient prefers ortho visit and referred at this time.

## 2015-04-12 NOTE — Assessment & Plan Note (Signed)
[place] colonoscopy 2014, constipation since moving to [place] and starting meds for Hep C, cirrhosis, BP. Even if we got records, could not interpret likely so will hold off. If had thin stools or progressive symptoms consider repeat colonoscopy  Intermittent abdominal pain when she becomes constipated (when she stops miralax for several weeks), resolves on miralax. We discussed trialing 1/2 capful every other day dosing and increasing fiber in diet.

## 2015-04-12 NOTE — Assessment & Plan Note (Signed)
Controlled on nadolol 20mg , spironolactone 50mg , lasix 20mg  as needed for BP and cirrhosis.

## 2015-04-22 DIAGNOSIS — M25511 Pain in right shoulder: Secondary | ICD-10-CM | POA: Diagnosis not present

## 2015-05-01 DIAGNOSIS — K7469 Other cirrhosis of liver: Secondary | ICD-10-CM | POA: Diagnosis not present

## 2015-05-01 DIAGNOSIS — K5790 Diverticulosis of intestine, part unspecified, without perforation or abscess without bleeding: Secondary | ICD-10-CM | POA: Diagnosis not present

## 2015-05-01 DIAGNOSIS — B182 Chronic viral hepatitis C: Secondary | ICD-10-CM | POA: Diagnosis not present

## 2015-05-01 DIAGNOSIS — C220 Liver cell carcinoma: Secondary | ICD-10-CM | POA: Diagnosis not present

## 2015-05-01 DIAGNOSIS — K766 Portal hypertension: Secondary | ICD-10-CM | POA: Diagnosis not present

## 2015-05-01 DIAGNOSIS — Z79899 Other long term (current) drug therapy: Secondary | ICD-10-CM | POA: Diagnosis not present

## 2015-05-01 DIAGNOSIS — K8020 Calculus of gallbladder without cholecystitis without obstruction: Secondary | ICD-10-CM | POA: Diagnosis not present

## 2015-05-01 DIAGNOSIS — K7689 Other specified diseases of liver: Secondary | ICD-10-CM | POA: Diagnosis not present

## 2015-05-20 ENCOUNTER — Telehealth: Payer: Self-pay

## 2015-05-20 NOTE — Telephone Encounter (Signed)
Patient's daughter Heidi Booker called back stating patient has not had a mammogram and patient declined wanting an order entered.

## 2015-05-20 NOTE — Telephone Encounter (Signed)
Called patient's home, spoke with daughter, said pt. was not home, daughter is interpreter, patient does not speak [place].  However, we do not have daughter listed on HIPPA form, only son Trilby Drummer).  I did not tell daughter what I was asking about setting up an overdue mammogram for patient.  Daughter will have pt. call when she returns home.  We will need to tell son about mammogram.  I recommended to daughter to have HIPPA revised so that we may discuss pt's information with pt's children, since pt does not speak [place].

## 2015-05-28 DIAGNOSIS — C220 Liver cell carcinoma: Secondary | ICD-10-CM | POA: Diagnosis not present

## 2015-05-28 DIAGNOSIS — B182 Chronic viral hepatitis C: Secondary | ICD-10-CM | POA: Diagnosis not present

## 2015-05-28 DIAGNOSIS — K7469 Other cirrhosis of liver: Secondary | ICD-10-CM | POA: Diagnosis not present

## 2015-07-03 ENCOUNTER — Encounter: Payer: Self-pay | Admitting: Family Medicine

## 2015-07-04 DIAGNOSIS — K7469 Other cirrhosis of liver: Secondary | ICD-10-CM | POA: Diagnosis not present

## 2015-07-04 DIAGNOSIS — B182 Chronic viral hepatitis C: Secondary | ICD-10-CM | POA: Diagnosis not present

## 2015-07-04 DIAGNOSIS — M7989 Other specified soft tissue disorders: Secondary | ICD-10-CM | POA: Diagnosis not present

## 2015-07-04 DIAGNOSIS — K7460 Unspecified cirrhosis of liver: Secondary | ICD-10-CM | POA: Diagnosis not present

## 2015-07-04 DIAGNOSIS — I10 Essential (primary) hypertension: Secondary | ICD-10-CM | POA: Diagnosis not present

## 2015-07-04 DIAGNOSIS — Z6832 Body mass index (BMI) 32.0-32.9, adult: Secondary | ICD-10-CM | POA: Diagnosis not present

## 2015-07-04 DIAGNOSIS — C220 Liver cell carcinoma: Secondary | ICD-10-CM | POA: Diagnosis not present

## 2015-07-04 DIAGNOSIS — Z5111 Encounter for antineoplastic chemotherapy: Secondary | ICD-10-CM | POA: Diagnosis not present

## 2015-07-05 ENCOUNTER — Encounter: Payer: Self-pay | Admitting: Family Medicine

## 2015-07-05 DIAGNOSIS — C229 Malignant neoplasm of liver, not specified as primary or secondary: Secondary | ICD-10-CM

## 2015-07-05 DIAGNOSIS — M7989 Other specified soft tissue disorders: Secondary | ICD-10-CM | POA: Diagnosis not present

## 2015-07-05 DIAGNOSIS — B182 Chronic viral hepatitis C: Secondary | ICD-10-CM | POA: Diagnosis not present

## 2015-07-05 DIAGNOSIS — Z5111 Encounter for antineoplastic chemotherapy: Secondary | ICD-10-CM | POA: Diagnosis not present

## 2015-07-05 DIAGNOSIS — I10 Essential (primary) hypertension: Secondary | ICD-10-CM | POA: Diagnosis not present

## 2015-07-05 DIAGNOSIS — K7460 Unspecified cirrhosis of liver: Secondary | ICD-10-CM | POA: Diagnosis not present

## 2015-07-05 DIAGNOSIS — C220 Liver cell carcinoma: Secondary | ICD-10-CM | POA: Diagnosis not present

## 2015-07-05 HISTORY — DX: Malignant neoplasm of liver, not specified as primary or secondary: C22.9

## 2015-07-11 ENCOUNTER — Telehealth: Payer: Self-pay | Admitting: Family Medicine

## 2015-07-11 DIAGNOSIS — C229 Malignant neoplasm of liver, not specified as primary or secondary: Secondary | ICD-10-CM

## 2015-07-11 NOTE — Telephone Encounter (Signed)
See below

## 2015-07-11 NOTE — Telephone Encounter (Signed)
Pt has been scheduled.  °

## 2015-07-11 NOTE — Telephone Encounter (Signed)
Pt went to [place] last week for chemo.  Daughter states pt needs labs by 7/28 (daughter is late) CBC, CMP, dx C 22.0 liver cancer.   Fax results to Zenovia Jordan RN/  [phone] Phone [phone] Dr Aleatha Borer    They would like to know if they can come here for those labs , so they will now have to travel; back to [place]   Depending on results, They will return to [place] in 2 months

## 2015-07-11 NOTE — Telephone Encounter (Signed)
Labs entered, Mrs. Heidi Booker please schedule lab visit for pt.

## 2015-07-11 NOTE — Telephone Encounter (Signed)
Yes you can enter these and set these up for her Keba. Thanks

## 2015-07-12 ENCOUNTER — Other Ambulatory Visit (INDEPENDENT_AMBULATORY_CARE_PROVIDER_SITE_OTHER): Payer: Medicare Other

## 2015-07-12 DIAGNOSIS — C229 Malignant neoplasm of liver, not specified as primary or secondary: Secondary | ICD-10-CM

## 2015-07-12 LAB — CBC
HCT: 38.5 % (ref 36.0–46.0)
HEMOGLOBIN: 12.6 g/dL (ref 12.0–15.0)
MCHC: 32.8 g/dL (ref 30.0–36.0)
MCV: 90.6 fl (ref 78.0–100.0)
Platelets: 78 10*3/uL — ABNORMAL LOW (ref 150.0–400.0)
RBC: 4.26 Mil/uL (ref 3.87–5.11)
RDW: 18.3 % — AB (ref 11.5–15.5)
WBC: 10.2 10*3/uL (ref 4.0–10.5)

## 2015-07-12 LAB — COMPREHENSIVE METABOLIC PANEL
ALBUMIN: 2.2 g/dL — AB (ref 3.5–5.2)
ALT: 72 U/L — AB (ref 0–35)
AST: 77 U/L — AB (ref 0–37)
Alkaline Phosphatase: 146 U/L — ABNORMAL HIGH (ref 39–117)
BUN: 15 mg/dL (ref 6–23)
CHLORIDE: 102 meq/L (ref 96–112)
CO2: 29 meq/L (ref 19–32)
CREATININE: 0.89 mg/dL (ref 0.40–1.20)
Calcium: 8.6 mg/dL (ref 8.4–10.5)
GFR: 66.38 mL/min (ref >60.00)
Glucose, Bld: 82 mg/dL (ref 70–99)
Potassium: 5.2 mEq/L — ABNORMAL HIGH (ref 3.5–5.1)
SODIUM: 135 meq/L (ref 135–145)
TOTAL PROTEIN: 7.6 g/dL (ref 6.0–8.3)
Total Bilirubin: 2.7 mg/dL — ABNORMAL HIGH (ref 0.2–1.2)

## 2015-08-01 ENCOUNTER — Ambulatory Visit: Payer: Medicare Other | Attending: Internal Medicine | Admitting: Internal Medicine

## 2015-08-01 ENCOUNTER — Encounter: Payer: Self-pay | Admitting: Internal Medicine

## 2015-08-01 VITALS — BP 116/75 | HR 70 | Temp 97.8°F | Resp 18 | Ht 62.0 in | Wt 156.8 lb

## 2015-08-01 DIAGNOSIS — D696 Thrombocytopenia, unspecified: Secondary | ICD-10-CM | POA: Diagnosis not present

## 2015-08-01 DIAGNOSIS — K7469 Other cirrhosis of liver: Secondary | ICD-10-CM

## 2015-08-01 DIAGNOSIS — K219 Gastro-esophageal reflux disease without esophagitis: Secondary | ICD-10-CM

## 2015-08-01 DIAGNOSIS — C220 Liver cell carcinoma: Secondary | ICD-10-CM | POA: Diagnosis not present

## 2015-08-01 DIAGNOSIS — M170 Bilateral primary osteoarthritis of knee: Secondary | ICD-10-CM

## 2015-08-01 MED ORDER — FUROSEMIDE 20 MG PO TABS: 20.0000 mg | ORAL_TABLET | Freq: Every day | ORAL | Status: DC

## 2015-08-01 MED ORDER — SPIRONOLACTONE 50 MG PO TABS: 50.0000 mg | ORAL_TABLET | Freq: Every day | ORAL | Status: DC

## 2015-08-01 MED ORDER — PANTOPRAZOLE SODIUM 40 MG PO TBEC: 40.0000 mg | DELAYED_RELEASE_TABLET | Freq: Every day | ORAL | Status: DC

## 2015-08-01 NOTE — Progress Notes (Signed)
Patient ID: Heidi Booker, female   DOB: [birth year], 69 y.o.   MRN: [id number]   Heidi Booker, is a 69 y.o. female  [id number]  [id number]  DOB - 05/05/1946  CC:  Chief Complaint  Patient presents with  . Establish Care       HPI: Heidi Booker is a 69 y.o. female here today to establish medical care. Patient is a non-English speaking female from [place] recently diagnosed with hepatocellular carcinoma and thrombocytopenia in the setting of liver cirrhosis ?HCV related, status post chemo-embolization at [facility] and has follow-up with hematology oncologist coming up. Pt reports pain in right arm rated at a 4 or 5 described as stabbing. Pain starts at right shoulder and radiates down arm. Pain comes and goes. Pain has been present since had procedure done. Pt reports pain in left knee rated at a 10 described as "feels like something is hitting it hard.? Pt reports that her knee is swollen often. Pain is constant. Pain has been present for years. Pt needs a refill on protonix, spironolactone, and lasix. Pt requests a pain medication. Pt has not taken medications for today due to them making her sleepy and dizzy. Patient has occasional headache, No chest pain, mild abdominal pain - No Nausea, No new weakness tingling or numbness, No Cough - SOB. Patient is present for this and contact with her daughter. Her other medical history includes hypertension, gastric ulcer, hepatitis C with cirrhosis and esophageal varices. Her medications are listed below.  No Known Allergies Past Medical History  Diagnosis Date  . Ulcer   . Hepatitis C   . Refusal of blood transfusions as patient is Jehovah's Witness   . Hypertension   . 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 From Hep C.  Xifaxin causesd severe abdominal pain. Was given to reduce confusion from cirrhosis.    . Hepatitis C 01/21/2015    Under care Dr. Burnetta Sabin. Starting to treat hep c  per patient.    . Depression 08/24/2014    Has a sister that is very sick and dealing with stress of this   . Nephrolithiasis 08/24/2014  . Arthritis 08/24/2014    Knees and ankles    Current Outpatient Prescriptions on File Prior to Visit  Medication Sig Dispense Refill  . ondansetron (ZOFRAN) 4 MG tablet Take 1 tablet (4 mg total) by mouth every 6 (six) hours as needed for nausea, vomiting or refractory nausea / vomiting. 20 tablet 0  . benazepril (LOTENSIN) 10 MG tablet Take 10 mg by mouth 2 (two) times daily.    . nadolol (CORGARD) 20 MG tablet Take 20 mg by mouth daily.     No current facility-administered medications on file prior to visit.   Family History  Problem Relation Age of Onset  . Pulmonary fibrosis Sister   . Kidney disease Sister    Social History   Social History  . Marital Status: Unknown    Spouse Name: N/A  . Number of Children: N/A  . Years of Education: N/A   Occupational History  . Not on file.   Social History Main Topics  . Smoking status: Never Smoker   . Smokeless tobacco: Not on file  . Alcohol Use: No     Comment: quit 24 years ago  . Drug Use: No  . Sexual Activity: No   Other Topics Concern  . Not on file   Social History Narrative   **  Merged History Encounter **       Lives with son, 3 daughters. 4 children (lost 3 boys in childbirth). 3 grandkids.  Originally from [place]  Retired, stays at home.   Hobbies: stays at home    Review of Systems: Constitutional: Negative for fever, chills, diaphoresis, activity change, appetite change and fatigue. HENT: Negative for ear pain, nosebleeds, congestion, facial swelling, rhinorrhea, neck pain, neck stiffness and ear discharge.  Eyes: Negative for pain, discharge, redness, itching and visual disturbance. Respiratory: Negative for cough, choking, chest tightness, shortness of breath, wheezing and stridor.  Cardiovascular: Negative for chest pain, palpitations and leg  swelling. Gastrointestinal: Negative for abdominal distention. Genitourinary: Negative for dysuria, urgency, frequency, hematuria, flank pain, decreased urine volume, difficulty urinating and dyspareunia.  Musculoskeletal: Negative for back pain, joint swelling, arthralgia and gait problem. Neurological: Negative for dizziness, tremors, seizures, syncope, facial asymmetry, speech difficulty, weakness, light-headedness, numbness and headaches.  Hematological: Negative for adenopathy. Does not bruise/bleed easily. Psychiatric/Behavioral: Negative for hallucinations, behavioral problems, confusion, dysphoric mood, decreased concentration and agitation.    Objective:   Filed Vitals:   08/01/15 1420  BP: 116/75  Pulse: 70  Temp: 97.8 F (36.6 C)  Resp: 18    Physical Exam: Constitutional: Patient appears well-developed and well-nourished. No distress. HENT: Normocephalic, atraumatic, External right and left ear normal. Oropharynx is clear and moist.  Eyes: Conjunctivae and EOM are normal. PERRLA, no scleral icterus. Neck: Normal ROM. Neck supple. No JVD. No tracheal deviation. No thyromegaly. CVS: RRR, S1/S2 +, no murmurs, no gallops, no carotid bruit.  Pulmonary: Effort and breath sounds normal, no stridor, rhonchi, wheezes, rales.  Abdominal: Soft. BS +, no distension, tenderness, rebound or guarding.  Musculoskeletal: Normal range of motion. No edema and no tenderness.  Lymphadenopathy: No lymphadenopathy noted, cervical, inguinal or axillary Neuro: Alert. Normal reflexes, muscle tone coordination. No cranial nerve deficit. Skin: Skin is warm and dry. No rash noted. Not diaphoretic. No erythema. No pallor. Psychiatric: Normal mood and affect. Behavior, judgment, thought content normal.  Lab Results  Component Value Date   WBC 10.2 07/12/2015   HGB 12.6 07/12/2015   HCT 38.5 07/12/2015   MCV 90.6 07/12/2015   PLT 78.0 Repeated and verified X2.* 07/12/2015   Lab Results   Component Value Date   CREATININE 0.89 07/12/2015   BUN 15 07/12/2015   NA 135 07/12/2015   K 5.2* 07/12/2015   CL 102 07/12/2015   CO2 29 07/12/2015    No results found for: HGBA1C Lipid Panel  No results found for: CHOL, TRIG, HDL, CHOLHDL, VLDL, LDLCALC     Assessment and plan:   1. Thrombocytopenia  - Ambulatory referral to Hematology  2. Hepatocellular carcinoma: S/P Chemoembolectomy Now having headache and worsening back pain  - NM PET Image Initial (PI) Whole Body; Future  3. Gastroesophageal reflux disease without esophagitis  - pantoprazole (PROTONIX) 40 MG tablet; Take 1 tablet (40 mg total) by mouth daily.  Dispense: 90 tablet; Refill: 3  4. Other cirrhosis of liver Refill - spironolactone (ALDACTONE) 50 MG tablet; Take 1 tablet (50 mg total) by mouth daily.  Dispense: 90 tablet; Refill: 3 - furosemide (LASIX) 20 MG tablet; Take 1 tablet (20 mg total) by mouth daily.  Dispense: 90 tablet; Refill: 3  5. Primary osteoarthritis of both knees: Patient is not a candidate for NSAIDs because of thrombocytopenia, she has oxycodone at home but does not take them because of dizziness and excessive sleepiness.   - Ambulatory referral  to Sports Medicine for possible intraarticular injection  Return in about 4 weeks (around 08/29/2015) for Follow up Pain and comorbidities, HCC.  The patient was given clear instructions to go to ER or return to medical center if symptoms don't improve, worsen or new problems develop. The patient verbalized understanding. The patient was told to call to get lab results if they haven't heard anything in the next week.     This note has been created with Surveyor, quantity. Any transcriptional errors are unintentional.    [name], MD, [place], [place], [place], [place]   08/01/2015, 3:09 PM

## 2015-08-01 NOTE — Progress Notes (Signed)
Patient here to establish care. Pt reports pain in right arm rated at a 4 or 5 described as stabbing. Pain starts at right shoulder and radiates down arm. Pain comes and goes.  Pain has been present since had procedure done but unsure what the procedure was. Pt reports pain in left knee rated at a 10 described as "feels like something is hitting it hard.? Pt reports that her knee is swollen often. Pain is constant. Pain has been present for years.   Pt needs a refill on protonix, spironolactone, and lasix. Pt requests a pain medication. Pt has not taken medications for today due to them making her sleepy and dizzy.

## 2015-08-01 NOTE — Patient Instructions (Signed)
Osteoartritis (Osteoarthritis) La osteoartritis es una enfermedad que provoca dolor e inflamacin en las articulaciones. Ocurre cuando el cartlago de la articulacin afectada se desgasta. El cartlago acta como una almohadilla que cubre los extremos de los huesos que forman una articulacin. La osteoartritis es la ms frecuente de reumatismo articular. Afecta a menudo a los ancianos. Las articulaciones que se ven ms afectadas por esta afeccin son las que se encuentran en las siguientes zonas:  Los extremos de los dedos.  Los pulgares.  El cuello.  La parte inferior de la espalda.  Las rodillas.  Las caderas CAUSAS  Con el paso del [place], el cartlago que recubre los extremos de los huesos comienza a IT sales professional. Esto provoca friccin [employer], lo que causa dolor y entumecimiento en las articulaciones afectadas.  [place] probabilidades de padecer osteoartritis, incluidos los siguientes:  Edad avanzada.  Exceso de Engineer, site.  Uso excesivo de la articulacin. [place] y entumecimiento en la articulacin.  Con el tiempo, la articulacin pierde su forma normal.  Pueden formarse pequeos depsitos de hueso (ostefitos) en los extremos de Water engineer.  Algunos trozos de [employer] o cartlago pueden separarse y flotar dentro del espacio de la articulacin. Esto puede causar ms dolor y lesiones. DIAGNSTICO  El mdico le preguntar acerca de sus sntomas y le har un examen fsico. Le indicarn varios estudios, como:  Radiografas de Counselling psychologist.  Una resonancia magntica (RM).  Anlisis de sangre para descartar otros tipos de artritis.  Anlisis de los fluidos de Water engineer. Para ello se utiliza una aguja para extraer lquido de la articulacin y examinarlo en el microscopio. TRATAMIENTO  Los [employer] del tratamiento son Financial controller y mejorar el funcionamiento  de Water engineer. Los planes de tratamiento pueden incluir lo siguiente:  Un programa de ejercicios recomendado que permita el descanso y el alivio de la articulacin.  Un plan de control del peso.  Tcnicas de [employer], como las siguientes:  Aplicacin correcta de fro y Freight forwarder.  Impulsos elctricos enviados a las terminaciones nerviosas que se encuentran debajo de la piel (neuroestimulacin elctrica transcutnea [TENS, por sus siglas en ingls]).  Masajes.  Ciertos suplementos nutricionales.  Medicamentos para Financial controller como:  Paracetamol.  Antiinflamatorios no esteroides (AINE), como el naproxeno.  Narcticos o agentes de accin central, como el tramadol.  Corticoides. Estos se pueden administrar por va oral o mediante una inyeccin.  [place] para reposicionar los [employer] y Best boy (osteotoma) o para retirar las piezas sueltas de hueso y Database administrator. Puede ser necesario el reemplazo de las articulaciones en estadios avanzados de la enfermedad. [place] los medicamentos solamente como se lo haya indicado el mdico.  Mantenga un peso saludable. Siga las instrucciones del mdico con respecto al control del [place]. Esto puede incluir instrucciones Recruitment consultant.  Practique los ejercicios que le indiquen. Es posible que el mdico le recomiende tipos especficos de ejercicios. Estos pueden incluir:  Ejercicios de fortalecimiento Se realizan para fortalecer los [employer] sostienen las articulaciones afectadas por la artritis. Pueden realizarse con peso o con bandas para agregar resistencia.  Actividades Precious Haws. Son Clinical research associate a paso ligero, gimnasia [place] de bajo impacto, que acelere el corazn.  Actividades de amplitud de movimientos. Dan agilidad a las articulaciones.  Ejercicios de equilibrio y [place]. Ayudan a [employer] Micro Devices se necesitan para  la vida diaria.  Haga descansar  a las articulaciones segn las indicaciones del mdico.  Concurra a todas las visitas de control como se lo haya indicado el mdico. SOLICITE ATENCIN MDICA SI:   La piel se pone roja.  Aparece una erupcin adems del dolor en la articulacin.  El dolor en la articulacin empeora.  Tiene fiebre y siente dolor en la articulacin o el msculo. SOLICITE ATENCIN MDICA DE INMEDIATO SI:  Nota una prdida importante de peso o del apetito.  Tiene transpiracin nocturna. [place] de Artritis y Arboriculturist Musculoesquelticas y Dermatolgicas [facility] of Arthritis and Musculoskeletal and Skin Diseases): www.niams.SouthExposed.es.  [place] ([employer] on Aging): http://kim-miller.com/.  [facility] de Research officer, political party of Rheumatology): www.rheumatology.org. Document Released: 09/09/2005 Document Revised: 04/16/2014 ExitCare Patient Information 2015 [place], [place]. This information is not intended to replace advice given to you by your health care provider. Make sure you discuss any questions you have with your health care provider.

## 2015-08-09 ENCOUNTER — Telehealth: Payer: Self-pay | Admitting: Internal Medicine

## 2015-08-09 ENCOUNTER — Ambulatory Visit (HOSPITAL_COMMUNITY)
Admission: RE | Admit: 2015-08-09 | Discharge: 2015-08-09 | Disposition: A | Discharge status: Home / Self Care | Payer: Medicare Other | Source: Ambulatory Visit | Attending: Internal Medicine | Admitting: Internal Medicine

## 2015-08-09 DIAGNOSIS — K7460 Unspecified cirrhosis of liver: Secondary | ICD-10-CM | POA: Diagnosis not present

## 2015-08-09 DIAGNOSIS — R51 Headache: Secondary | ICD-10-CM | POA: Diagnosis not present

## 2015-08-09 DIAGNOSIS — I700 Atherosclerosis of aorta: Secondary | ICD-10-CM | POA: Diagnosis not present

## 2015-08-09 DIAGNOSIS — K766 Portal hypertension: Secondary | ICD-10-CM | POA: Insufficient documentation

## 2015-08-09 DIAGNOSIS — K8020 Calculus of gallbladder without cholecystitis without obstruction: Secondary | ICD-10-CM | POA: Diagnosis not present

## 2015-08-09 DIAGNOSIS — K5730 Diverticulosis of large intestine without perforation or abscess without bleeding: Secondary | ICD-10-CM | POA: Insufficient documentation

## 2015-08-09 DIAGNOSIS — D696 Thrombocytopenia, unspecified: Secondary | ICD-10-CM | POA: Insufficient documentation

## 2015-08-09 DIAGNOSIS — M549 Dorsalgia, unspecified: Secondary | ICD-10-CM | POA: Diagnosis not present

## 2015-08-09 DIAGNOSIS — C220 Liver cell carcinoma: Secondary | ICD-10-CM | POA: Insufficient documentation

## 2015-08-09 LAB — GLUCOSE, CAPILLARY: GLUCOSE-CAPILLARY: 92 mg/dL (ref 65–99)

## 2015-08-09 MED ORDER — FLUDEOXYGLUCOSE F - 18 (FDG) INJECTION
7.6000 | Freq: Once | INTRAVENOUS | Status: DC | PRN
  Administered 2015-08-09: 7.6 via INTRAVENOUS
  Filled 2015-08-09: qty 7.6

## 2015-08-09 NOTE — Telephone Encounter (Signed)
Called patient and left message regarding scheduling new patient appointment.  Dx: Thrombocytopenia Referring: Dr. Doreene Burke

## 2015-08-13 ENCOUNTER — Telehealth: Payer: Self-pay | Admitting: *Deleted

## 2015-08-13 NOTE — Telephone Encounter (Signed)
-----   Message from Tresa Garter, MD sent at 08/09/2015  6:02 PM EDT ----- Please inform patient that her PET scan shows no evidence of metastatic disease.

## 2015-08-13 NOTE — Telephone Encounter (Signed)
Verified name and date of birth using pacific interpreter id # [phone] Informed patient that her PET scan shows no evidence of metastatic disease. Patient verbalized understanding and appreciation

## 2015-08-14 ENCOUNTER — Other Ambulatory Visit: Payer: Self-pay | Admitting: Family Medicine

## 2015-08-14 ENCOUNTER — Ambulatory Visit
Admission: RE | Admit: 2015-08-14 | Discharge: 2015-08-14 | Disposition: A | Discharge status: Home / Self Care | Payer: Medicare Other | Source: Ambulatory Visit | Attending: Family Medicine | Admitting: Family Medicine

## 2015-08-14 ENCOUNTER — Ambulatory Visit (INDEPENDENT_AMBULATORY_CARE_PROVIDER_SITE_OTHER): Payer: Medicare Other | Admitting: Family Medicine

## 2015-08-14 ENCOUNTER — Encounter: Payer: Self-pay | Admitting: Family Medicine

## 2015-08-14 VITALS — BP 125/54 | Ht 61.0 in | Wt 156.0 lb

## 2015-08-14 DIAGNOSIS — M25561 Pain in right knee: Secondary | ICD-10-CM

## 2015-08-14 DIAGNOSIS — M25562 Pain in left knee: Principal | ICD-10-CM

## 2015-08-14 DIAGNOSIS — M1710 Unilateral primary osteoarthritis, unspecified knee: Secondary | ICD-10-CM | POA: Diagnosis not present

## 2015-08-14 MED ORDER — METHYLPREDNISOLONE ACETATE 40 MG/ML IJ SUSP
40.0000 mg | Freq: Once | INTRAMUSCULAR | Status: AC
  Administered 2015-08-14: 40 mg via INTRA_ARTICULAR

## 2015-08-14 NOTE — Progress Notes (Signed)
  [name] - 69 y.o. female MRN [id number]  Date of birth: 1946/11/20  SUBJECTIVE:  Including CC & ROS.  Heidi Booker is a 69 y.o. female who presents today for bilateral knee pain.    Knee Pain bilateral - patient presents today for initial visit for bilateral knee pain. This is been ongoing now for about 4-5 years per her report. She has never injured either of these before. Pain is worse at the end of the day or after prolonged sitting and aches in the medial joint. She is previously told that she has degenerative joint disease of both medial compartments of the knee and would require bilateral TKA. However this was about 1 year ago and slightly after this she was diagnosed with hepatocellular carcinoma and thrombocytopenia. Since that point she has continued with chemotherapy therapy but is unable to take NSAIDs due to underlying thrombocytopenia. She currently is on oxycodone which helps with the pain somewhat. She has not had her knees injected in the past.  PMHx - Updated and reviewed.  Contributory factors include: Hepatocellular carcinoma, cirrhosis, thrombocytopenia PSHx - Updated and reviewed.  Contributory factors include:  Noncontributory FHx - Updated and reviewed.  Contributory factors include:  Noncontributory Medications - Aldactone, Lasix   Exam:  Filed Vitals:   08/14/15 1356  BP: 125/54    Gen: NAD Cardiorespiratory - Normal respiratory effort/rate.  RRR Knee bilateral exam:  Normal to inspection with no erythema or effusion or obvious bony abnormalities.  No obvious Baker's cysts Tenderness palpation medial joint line bilateral ROM decreased to 0-100 Ligaments with solid consistent endpoints  Negative meniscal tests  Non painful patellar femoral compression.  Normal Patellar glide. No apprehension   Neurovascularly intact B/L LE

## 2015-08-14 NOTE — Assessment & Plan Note (Signed)
Patient with it sounds like severe degenerative joint disease of bilateral knees. She is unable to take NSAIDs due to underlying cirrhosis and thrombocytopenia and Tylenol as limited due to underlying cirrhosis. -Repeat x-rays with standing views. -We will inject today both knees with corticosteroids for symptom relief. -Continue with oxycodone per primary. Not a good candidate for NSAIDs, Tylenol, tramadol. -Consider knee sleeve or Synvisc in future as alternative options to surgery.   Aspiration/Injection Procedure Note Heidi Booker 13-May-1946  Procedure: Injection Indications: B/L Knee OA  Procedure Details Consent: Risks of procedure as well as the alternatives and risks of each were explained to the (patient/caregiver).  Consent for procedure obtained. Time Out: Verified patient identification, verified procedure, site/side was marked, verified correct patient position, special equipment/implants available, medications/allergies/relevent history reviewed, required imaging and test results available.  Performed.  The area was cleaned with iodine and alcohol swabs.    The medial aspect of both knees was injected using 2 cc's of 40mg  Depomedrol and 4 cc's of 1% lidocaine with a 21 1 1/2" needle.    A sterile dressing was applied.  Patient did tolerate procedure well. Estimated blood loss: None

## 2015-08-15 ENCOUNTER — Telehealth: Payer: Self-pay | Admitting: Hematology

## 2015-08-15 NOTE — Telephone Encounter (Signed)
pt called to cx...did not want to r/s at this tme..will call back to r/s

## 2015-08-29 ENCOUNTER — Ambulatory Visit: Payer: Medicare Other | Admitting: Internal Medicine

## 2015-08-29 DIAGNOSIS — K7460 Unspecified cirrhosis of liver: Secondary | ICD-10-CM | POA: Diagnosis present

## 2015-08-29 DIAGNOSIS — G459 Transient cerebral ischemic attack, unspecified: Secondary | ICD-10-CM | POA: Diagnosis not present

## 2015-08-29 DIAGNOSIS — C220 Liver cell carcinoma: Secondary | ICD-10-CM | POA: Diagnosis present

## 2015-08-29 DIAGNOSIS — I6782 Cerebral ischemia: Secondary | ICD-10-CM | POA: Diagnosis present

## 2015-08-29 DIAGNOSIS — R932 Abnormal findings on diagnostic imaging of liver and biliary tract: Secondary | ICD-10-CM | POA: Diagnosis not present

## 2015-08-29 DIAGNOSIS — R531 Weakness: Secondary | ICD-10-CM | POA: Diagnosis not present

## 2015-08-29 DIAGNOSIS — K766 Portal hypertension: Secondary | ICD-10-CM | POA: Diagnosis present

## 2015-08-29 DIAGNOSIS — I10 Essential (primary) hypertension: Secondary | ICD-10-CM | POA: Diagnosis not present

## 2015-08-29 DIAGNOSIS — D696 Thrombocytopenia, unspecified: Secondary | ICD-10-CM | POA: Diagnosis not present

## 2015-08-29 DIAGNOSIS — C229 Malignant neoplasm of liver, not specified as primary or secondary: Secondary | ICD-10-CM | POA: Diagnosis not present

## 2015-08-29 DIAGNOSIS — Z79899 Other long term (current) drug therapy: Secondary | ICD-10-CM | POA: Diagnosis not present

## 2015-08-29 DIAGNOSIS — K7469 Other cirrhosis of liver: Secondary | ICD-10-CM | POA: Diagnosis not present

## 2015-08-29 DIAGNOSIS — R278 Other lack of coordination: Secondary | ICD-10-CM | POA: Diagnosis present

## 2015-08-29 DIAGNOSIS — B182 Chronic viral hepatitis C: Secondary | ICD-10-CM | POA: Diagnosis not present

## 2015-08-29 DIAGNOSIS — I4581 Long QT syndrome: Secondary | ICD-10-CM | POA: Diagnosis not present

## 2015-08-29 DIAGNOSIS — Z79891 Long term (current) use of opiate analgesic: Secondary | ICD-10-CM | POA: Diagnosis not present

## 2015-08-29 DIAGNOSIS — Z87891 Personal history of nicotine dependence: Secondary | ICD-10-CM | POA: Diagnosis not present

## 2015-08-29 DIAGNOSIS — R29810 Facial weakness: Secondary | ICD-10-CM | POA: Diagnosis present

## 2015-08-29 DIAGNOSIS — B1920 Unspecified viral hepatitis C without hepatic coma: Secondary | ICD-10-CM | POA: Diagnosis not present

## 2015-08-29 DIAGNOSIS — H4922 Sixth [abducent] nerve palsy, left eye: Secondary | ICD-10-CM | POA: Diagnosis not present

## 2015-08-29 DIAGNOSIS — R930 Abnormal findings on diagnostic imaging of skull and head, not elsewhere classified: Secondary | ICD-10-CM | POA: Diagnosis not present

## 2015-08-29 DIAGNOSIS — R410 Disorientation, unspecified: Secondary | ICD-10-CM | POA: Diagnosis not present

## 2015-08-29 DIAGNOSIS — R202 Paresthesia of skin: Secondary | ICD-10-CM | POA: Diagnosis present

## 2015-09-02 ENCOUNTER — Other Ambulatory Visit: Payer: Medicare Other

## 2015-09-02 ENCOUNTER — Telehealth: Payer: Self-pay | Admitting: Family Medicine

## 2015-09-02 ENCOUNTER — Ambulatory Visit: Payer: Medicare Other | Admitting: Hematology

## 2015-09-02 NOTE — Telephone Encounter (Signed)
Patients daughter called stating that the patient was recently hospitalized for a stroke and needs to see Dr. Doreene Burke. Daughter was advised that the doctor will not be available until October. Please f/u

## 2015-09-05 NOTE — Telephone Encounter (Signed)
Patient called requesting to speak to nurse. Patient stated to she had a stroke, and was hospitalized in [place]. Patient was advised to f/u with her provider. Patient was advise that the doctor has no appointments until October. Please f/u with pt.

## 2015-09-11 ENCOUNTER — Ambulatory Visit: Payer: Medicare Other | Attending: Family Medicine | Admitting: Family Medicine

## 2015-09-11 ENCOUNTER — Encounter: Payer: Self-pay | Admitting: Family Medicine

## 2015-09-11 VITALS — BP 154/77 | HR 66 | Temp 98.3°F | Ht 61.0 in | Wt 171.6 lb

## 2015-09-11 DIAGNOSIS — M170 Bilateral primary osteoarthritis of knee: Secondary | ICD-10-CM | POA: Diagnosis not present

## 2015-09-11 DIAGNOSIS — C220 Liver cell carcinoma: Secondary | ICD-10-CM | POA: Diagnosis not present

## 2015-09-11 DIAGNOSIS — M1710 Unilateral primary osteoarthritis, unspecified knee: Secondary | ICD-10-CM | POA: Diagnosis not present

## 2015-09-11 DIAGNOSIS — G459 Transient cerebral ischemic attack, unspecified: Secondary | ICD-10-CM | POA: Insufficient documentation

## 2015-09-11 DIAGNOSIS — I10 Essential (primary) hypertension: Secondary | ICD-10-CM

## 2015-09-11 DIAGNOSIS — G458 Other transient cerebral ischemic attacks and related syndromes: Secondary | ICD-10-CM

## 2015-09-11 MED ORDER — ATORVASTATIN CALCIUM 80 MG PO TABS: 80.0000 mg | ORAL_TABLET | Freq: Every day | ORAL | Status: DC

## 2015-09-11 MED ORDER — NADOLOL 20 MG PO TABS: 20.0000 mg | ORAL_TABLET | Freq: Every day | ORAL | Status: DC

## 2015-09-11 MED ORDER — TRAMADOL HCL 50 MG PO TABS: 50.0000 mg | ORAL_TABLET | Freq: Three times a day (TID) | ORAL | Status: DC | PRN

## 2015-09-11 NOTE — Patient Instructions (Signed)
Ataque isqumico transitorio (Transient Ischemic Attack) Un ataque isqumico transitorio (AIT) es un "ictus de advertencia" que causa sntomas similares al ictus. A diferencia de un ictus, el AIT no causa dao permanente en el cerebro. Los sntomas pueden ocurrir muy rpido y no duran Management consultant. Es Photographer los sntomas de un AIT y Tax inspector. De esta [place], se puede prevenir un ictus grave o la muerte. CAUSAS   La causa es una obstruccin temporaria de una arteria en el cerebro o en el cuello (arteria cartida). La obstruccin no permite que llegue el flujo de sangre que el cerebro necesita, y puede causar diferentes sntomas. La causa de la obstruccin puede ser:  Un cogulo sanguneo.  Acumulacin de grasa (placa) en una arteria del cuello o el cerebro. FACTORES DE RIESGO  Presin arterial elevada (hipertensin).  Tener colesterol elevado.  Diabetes mellitus.  Padecer enfermedad cardacas.  La acumulacin de depsitos de grasa en los vasos sanguneos (enfermedad arterial perifrica o aterosclerosis).  La acumulacin de placa en los vasos sanguneos que proveen sangre y oxgeno al cerebro (estenosis de la arteria cartida).  Ritmo cardaco anormal (fibrilacin auricular).  Obesidad.  Fumar.  Consumir anticonceptivos orales, especialmente en combinacin con el hbito de fumar.  Falta de actividad fsica.  Dieta rica en grasas, sal (sodio) y muchas caloras.  Consumo de alcohol.  Consumo de drogas ilegales (especialmente cocana y metanfetamina).  Ser varn.  Pertenecer a Advertising copywriter.  Tener ms de 53aos.  Antecedentes familiares de ictus.  Historia previa de cogulos sanguneos, ictus, ataque isqumico transitorio o infarto.  Anemia drepanoctica. [place] sntomas son los [employer] del ictus, pero son transitorios. Estos sntomas por lo general aparecen en forma repentina, o podran aparecer al despertar:  Debilidad o  adormecimiento sbito de la cara, el brazo o la pierna, especialmente en un lado del cuerpo.  Dificultad repentina para caminar o para mover los brazos o las piernas.  Confusin sbita.  Cambios de personalidad sbitos.  Dificultad para hablar (afasia) o comprender.  Dificultad para tragar.  Dificultad sbita en la visin de uno o ambos ojos.  Visin doble.  Mareos.  Prdida del equilibrio o de la coordinacin.  Dolor de cabeza sbito sin causa aparente.  Dificultad para leer o escribir.  Prdida del control del intestino o de la vejiga.  Prdida de la conciencia. DIAGNSTICO  El mdico podr determinar la presencia de un AIT segn sus sntomas, la historia clnica y el examen fsico. Para identificar un ataque isqumico transitorio se realiza una tomografa computada del cerebro. Pueden realizarse otros estudios para confirmar el diagnstico. Estos estudios pueden incluir:  Aeronautical engineer.  Monitorizacin electrocardiogrfica continua:  Ecocardiograma.  Ecografa de la cartida.  Imgenes por Health visitor (IRM).  Estudio de la circulacin cerebral.  Anlisis de [place]. PREVENCIN  El riesgo de sufrir un AIT puede disminuir si se trata adecuadamente la hipertensin arterial, el nivel elevado de colesterol, la diabetes, la enfermedad cardaca y la obesidad, si se deja de fumar, se limita la ingesta de alcohol y se Printmaker. [place] es fundamental. Dado que los sntomas del AIT son los mismos que los del ictus, es importante recibir tratamiento tan pronto como sea posible porque puede necesitar un medicamento para Psychologist, educational cogulo (tromboltico) que no puede administrarse si ha transcurrido [employer]. Las opciones de tratamiento varan. Las opciones de tratamiento incluye reposo, oxgeno, lquidos por va intravenosa y medicamentos que lican la sangre (anticoagulantes). Los [employer]  y la dieta se utilizan para tratar  la diabetes, la hipertensin arterial y 47 factores de Sales executive. Se tomarn medidas para evitar complicaciones a corto y a Barrister's clerk, incluyendo las infecciones por materiales extraos en los pulmones (neumona por aspiracin), cogulos sanguneos en las piernas y cadas. Las opciones de tratamiento incluyen procedimientos para eliminar la placa en la cartida o para dilatar la cartida que se ha estrechado debido a Engineer, civil (consulting). Estos procedimientos son:  Endarterectoma carotdea.  Angioplastia carotdea y colocacin de stent. Topton los medicamentos como le indic el mdico. Siga cuidadosamente las indicaciones. Podrn administrarle medicamentos para controlar los factores de riesgo del ictus. Asegrese de comprender todas las indicaciones para la utilizacin de los medicamentos.  Es posible que le indiquen que tome aspirina o el anticoagulante warfarina. Es necesario que reciba la warfarina exactamente segn las indicaciones.  Tomar demasiada o muy poca warfarina es peligroso. El exceso de warfarina aumenta el riesgo de [place]. Dosis demasiado bajas de warfarina aumentan el riesgo de formacin de cogulos. Durante el tratamiento con warfarina, es necesario que se realicen peridicamente anlisis de sangre que miden el tiempo de Proofreader. Un anlisis de sangre TP mide el tiempo que tarda la sangre en coagularse. El TP se [place] para calcular otro valor llamado INR. Los resultados de esos anlisis ayudan a que el mdico ajuste la dosis de Geologist, engineering. La dosis puede cambiarse por varias razones. Es de suma importancia tomar estos medicamentos tal como se indican.  Muchos de los alimentos, [employer] que contienen vitamina K pueden interferir con la warfarina y Print production planner el TP y el INR. Los alimentos con alto contenido de vitamina K incluyen espinaca, col rizada, brcoli, repollo, acelga y nabos verdes, repollitos de bruselas, garbanzos, coliflor,  algas, perejil, y tambin bife e hgado de cerdo, t verde y aceite de soja. Debe consumir siempre la misma cantidad de alimentos ricos en vitamina K. Evite los [employer] en su dieta, o informe a su mdico antes de cambiar su dieta. Solicite una cita con un nutricionista para hacerle las preguntas que le surjan.  Muchos medicamentos interfieren con la warfarina y afectan el TP y el INR. Debe decirle a su mdico todas los medicamentos que toma, incluyendo las vitaminas y los suplementos nutricionales. Tenga especial cuidado con las aspirinas y los medicamentos antiinflamatorios. No tome ni suspenda ningn medicamento tanto recetado como de venta libre a menos que se lo haya indicado su mdico o Development worker, international aid.  La warfarina puede tener efectos adversos, tales como hematomas o sangrado en exceso. Si se lastima, deber ejercer presin sobre las heridas por ms tiempo que lo habitual. Su mdico o su Development worker, international aid comentarn otros posibles efectos secundarios.  Evite realizar actividades deportivas que puedan causar lesiones o sangrado.  Sea cuidadoso al [employer], Risk manager hilo dental, o manipular objetos cortantes.  El alcohol puede modificar la capacidad del organismo de metabolizar la warfarina. Es preferible evitar tomar bebidas alcohlicas o consumir solo muy pequeas cantidades cuando se est en tratamiento con warfarina. Hgale saber a su mdico si modifica su consumo de alcohol.  Informe a su dentista o a otros profesionales antes de que le efecten cualquier procedimiento.  Consuma una dieta que incluya cinco porciones de frutas y vegetales o ms por Training and development officer. Esto puede reducir [employer] de sufrir un ictus. Se pueden indicar ciertas dietas para controlar la hipertensin arterial, los niveles elevados de colesterol, la diabetes o la obesidad.  Se recomienda  una dieta baja en sodio, en grasas saturadas, en grasas trans y en colesterol para controlar la hipertensin.  Una dieta baja en  grasas saturadas, grasas trans, en colesterol y alta en fibra puede controlar los niveles de [place].  Se recomienda una dieta con control de los carbohidratos y Location manager en los casos de diabetes.  Se recomienda una dieta baja en caloras, en sodio, en grasas saturadas, en grasas trans y baja en colesterol para controlar la obesidad.  Mantenga un peso saludable.  Mantngase fsicamente activo. Se recomienda que realice al menos [id number] de [place] todos o casi [employer].  No fumar.  Limite el consumo de alcohol aunque no tome warfarina. Se considera consumo moderado:  No ms de 2 medidas por da para los hombres.  No ms de 1 medida por da para las mujeres que no estn [place].  Evite el consumo de drogas.  La seguridad en el hogar. Un ambiente seguro en el hogar es importante para reducir el riesgo de cadas. El mdico podr disponer que los especialistas lo evalen en su domicilio. Tambin es Data processing manager barras para sostn en la habitacin y el bao. El Corporate investment banker un equipo para Occupational psychologist, como un inodoro elevado o un asiento para la ducha.  Siga todas las instrucciones para Optometrist un control con su mdico. Esto es muy importante. Aqu se incluyen derivaciones y anlisis de laboratorio. Un tratamiento adecuado puede prevenir que ocurra un ictus u otro AIT. SOLICITE ATENCIN MDICA SI:  Hay cambios en su personalidad.  Tiene dificultad para tragar.  Tiene visin doble  Tiene mareos.  Tiene fiebre.  Tiene lceras en la piel. SOLICITE ATENCIN MDICA DE INMEDIATO SI:  Cualquiera de estos sntomas puede representar un problema grave y es Engineer, maintenance (IT). No espere para ver si los sntomas desaparecen. Solicite atencin mdica de inmediato. Comunquese con el servicio de emergencias de su localidad (911 en los [place]). No conduzca por sus propios medios [employer].  Siente debilidad o adormecimiento sbito de la cara, el  brazo o la pierna, especialmente en un lado del cuerpo.  Tiene una dificultad repentina para caminar o para mover los brazos o las piernas.  Se siente repentinamente confundido.  Tiene dificultad para hablar (afasia) o comprender.  Presenta, sbitamente, dificultad para ver de uno o ambos ojos.  Pierde el equilibrio o la coordinacin.  Siente sbitamente un dolor de cabeza intenso que no tiene causa aparente.  Siente un nuevo dolor en el pecho o latidos cardacos irregulares.  Sufre la prdida parcial o total de la conciencia. ASEGRESE DE QUE:   Comprende estas instrucciones.  Controlar su afeccin.  Recibir ayuda de inmediato si no mejora o si empeora. Document Released: 09/09/2005 Document Revised: 12/05/2013 [facility] Patient Information 2015 [place]. This information is not intended to replace advice given to you by your health care provider. Make sure you discuss any questions you have with your health care provider.

## 2015-09-11 NOTE — Progress Notes (Signed)
CC:  HPI: Heidi Booker is a 69 y.o. female L handed female with history of hypertension, hepatocellular carcinoma (s/p TACE 10 weeks ago), bilateral knee osteoarthritis status post cortisone injections who was recently hospitalized at [facility] for TIA. Reviewed records from care everywhere.  She was at her oncologist appointment at [facility] and the patient's daughter had complained about the mom being confused, physical exam revealed left facial droop suggestive of a cranial nerve VI palsy and she was referred to the ED but by the time she arrived symptoms had resolved. 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 Blood work was significant for elevated AFP at 12.7 and thrombocytopenia. She was has the window for TPA and was admitted with a diagnosis of TIA. She was placed on Atorvastatin 80mg , evaluated by physical therapy, occupational therapy and speech and language pathology and was subsequently discharged after being cleared by PT and speech and language pathology.  Interval history: She denies any residual weakness but complains of pain in her right arm at the site where she had an IV inserted. She also has intermittent pain in both knees secondary to Osteoarthritis.    No Known Allergies Past Medical History  Diagnosis Date  . Ulcer   . Hepatitis C   . Refusal of blood transfusions as patient is Jehovah's Witness   . Hypertension   . 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 From Hep C.  Xifaxin causesd severe abdominal pain. Was given to reduce confusion from cirrhosis.    . Hepatitis C 01/21/2015    Under care Dr. Burnetta Sabin. Starting to treat hep c per  patient.    . Depression 08/24/2014    Has a sister that is very sick and dealing with stress of this   . Nephrolithiasis 08/24/2014  . Arthritis 08/24/2014    Knees and ankles    Current Outpatient Prescriptions on File Prior to Visit  Medication Sig Dispense Refill  . furosemide (LASIX) 20 MG tablet Take 1 tablet (20 mg total) by mouth daily. 90 tablet 3  . pantoprazole (PROTONIX) 40 MG tablet Take 1 tablet (40 mg total) by mouth daily. 90 tablet 3  . spironolactone (ALDACTONE) 50 MG tablet Take 1 tablet (50 mg total) by mouth daily. 90 tablet 3  . benazepril (LOTENSIN) 10 MG tablet Take 10 mg by mouth 2 (two) times daily.    . ondansetron (ZOFRAN) 4 MG tablet Take 1 tablet (4 mg total) by mouth every 6 (six) hours as needed for nausea, vomiting or refractory nausea / vomiting. (Patient not taking: Reported on 09/11/2015) 20 tablet 0  . oxyCODONE (OXY IR/ROXICODONE) 5 MG immediate release tablet Take 5 mg by mouth every 4 (four) hours as needed for severe pain.     No current facility-administered medications on file prior to visit.   Family History  Problem Relation Age of Onset  . Pulmonary fibrosis Sister   . Kidney disease Sister    Social History   Social History  . Marital Status: Unknown    Spouse Name: N/A  . Number of Children: N/A  . Years of Education: N/A   Occupational History  . Not on  file.   Social History Main Topics  . Smoking status: Never Smoker   . Smokeless tobacco: Not on file  . Alcohol Use: No     Comment: quit 24 years ago  . Drug Use: No  . Sexual Activity: No   Other Topics Concern  . Not on file   Social History Narrative   ** Merged History Encounter **       Lives with son, 3 daughters. 4 children (lost 3 boys in childbirth). 3 grandkids.  Originally from [place]  Retired, stays at home.   Hobbies: stays at home    Review of Systems: Constitutional: Negative for fever, chills, diaphoresis, activity change, appetite change and  fatigue. HENT: Negative for ear pain, nosebleeds, congestion, facial swelling, rhinorrhea, neck pain, neck stiffness and ear discharge.  Eyes: Negative for pain, discharge, redness, itching and visual disturbance. Respiratory: Negative for cough, choking, chest tightness, shortness of breath, wheezing and stridor.  Cardiovascular: Negative for chest pain, palpitations and leg swelling. Gastrointestinal: Negative for abdominal distention. Genitourinary: Negative for dysuria, urgency, frequency, hematuria, flank pain, decreased urine volume, difficulty urinating and dyspareunia.  Musculoskeletal: see hpi Neurological: Negative for dizziness, tremors, seizures, syncope, facial asymmetry, speech difficulty, weakness, light-headedness, numbness and headaches.  Hematological: Negative for adenopathy. Does not bruise/bleed easily. Psychiatric/Behavioral: Negative for hallucinations, behavioral problems, confusion, dysphoric mood, decreased concentration and agitation.    Objective:   Filed Vitals:   09/11/15 1622  BP: 154/77  Pulse: 66  Temp: 98.3 F (36.8 C)    Physical Exam: Constitutional: Patient appears well-developed and well-nourished. No distress. HENT: Normocephalic, atraumatic, External right and left ear normal. Oropharynx is clear and moist.  Eyes: Conjunctivae and EOM are normal. PERRLA, no scleral icterus. Neck: Normal ROM. Neck supple. No JVD. No tracheal deviation. No thyromegaly. CVS: RRR, S1/S2 +, no murmurs, no gallops, no carotid bruit.  Pulmonary: Effort and breath sounds normal, no stridor, rhonchi, wheezes, rales.  Abdominal: Soft. BS +,  no distension, tenderness, rebound or guarding.  Musculoskeletal: Reduced and tender ROM in knees; mild tenderness in right  antecubital fossa and on elevation of right arm Lymphadenopathy: No lymphadenopathy noted, cervical, inguinal or axillary Neuro: Alert. Normal reflexes, muscle tone coordination. No cranial nerve  deficit. Skin: Skin is warm and dry. No rash noted. Not diaphoretic. No erythema. No pallor. Psychiatric: Normal mood and affect. Behavior, judgment, thought content normal.  Lab Results  Component Value Date   WBC 10.2 07/12/2015   HGB 12.6 07/12/2015   HCT 38.5 07/12/2015   MCV 90.6 07/12/2015   PLT 78.0 Repeated and verified X2.* 07/12/2015   Lab Results  Component Value Date   CREATININE 0.89 07/12/2015   BUN 15 07/12/2015   NA 135 07/12/2015   K 5.2* 07/12/2015   CL 102 07/12/2015   CO2 29 07/12/2015         Assessment and plan:  TIA: Asymptomatic at this time Patient was referred for PT which she states she does not need.  Hypertension: BP is uncontrolled due to the fact that she is not taking any of her prescribed antihypertensives. I have refilled Nadolol and she will follow up with her PCP regarding the need for restarting Benazepril  Hepatocellular carcinoma: AFP still elevated Advised to keep appointment with Oncology  Bilateral knee Osteoarthritis S/p Cortisone shots with no relief in symptoms Placed on Tramadol      Arnoldo Morale, MD. [facility] [phone] 09/11/2015, 4:55 PM

## 2015-09-11 NOTE — Progress Notes (Signed)
[place] # [phone] used for interpretation Patient treated at [facility] for CVA Report pain in her right arm 6/10 (sore) she feels it could be from her IV at the hospital She is currently under treatment for liver tumors

## 2015-09-23 DIAGNOSIS — H2513 Age-related nuclear cataract, bilateral: Secondary | ICD-10-CM | POA: Diagnosis not present

## 2015-09-26 IMAGING — CR DG KNEE COMPLETE 4+V*L*
3 series · 3 of 3 positions shown · non-contrast
Comparison: None.

CLINICAL DATA: Bilateral knee pain

EXAM:
LEFT KNEE - COMPLETE 4+ VIEW

[w knee obl. left (1 of 2)]
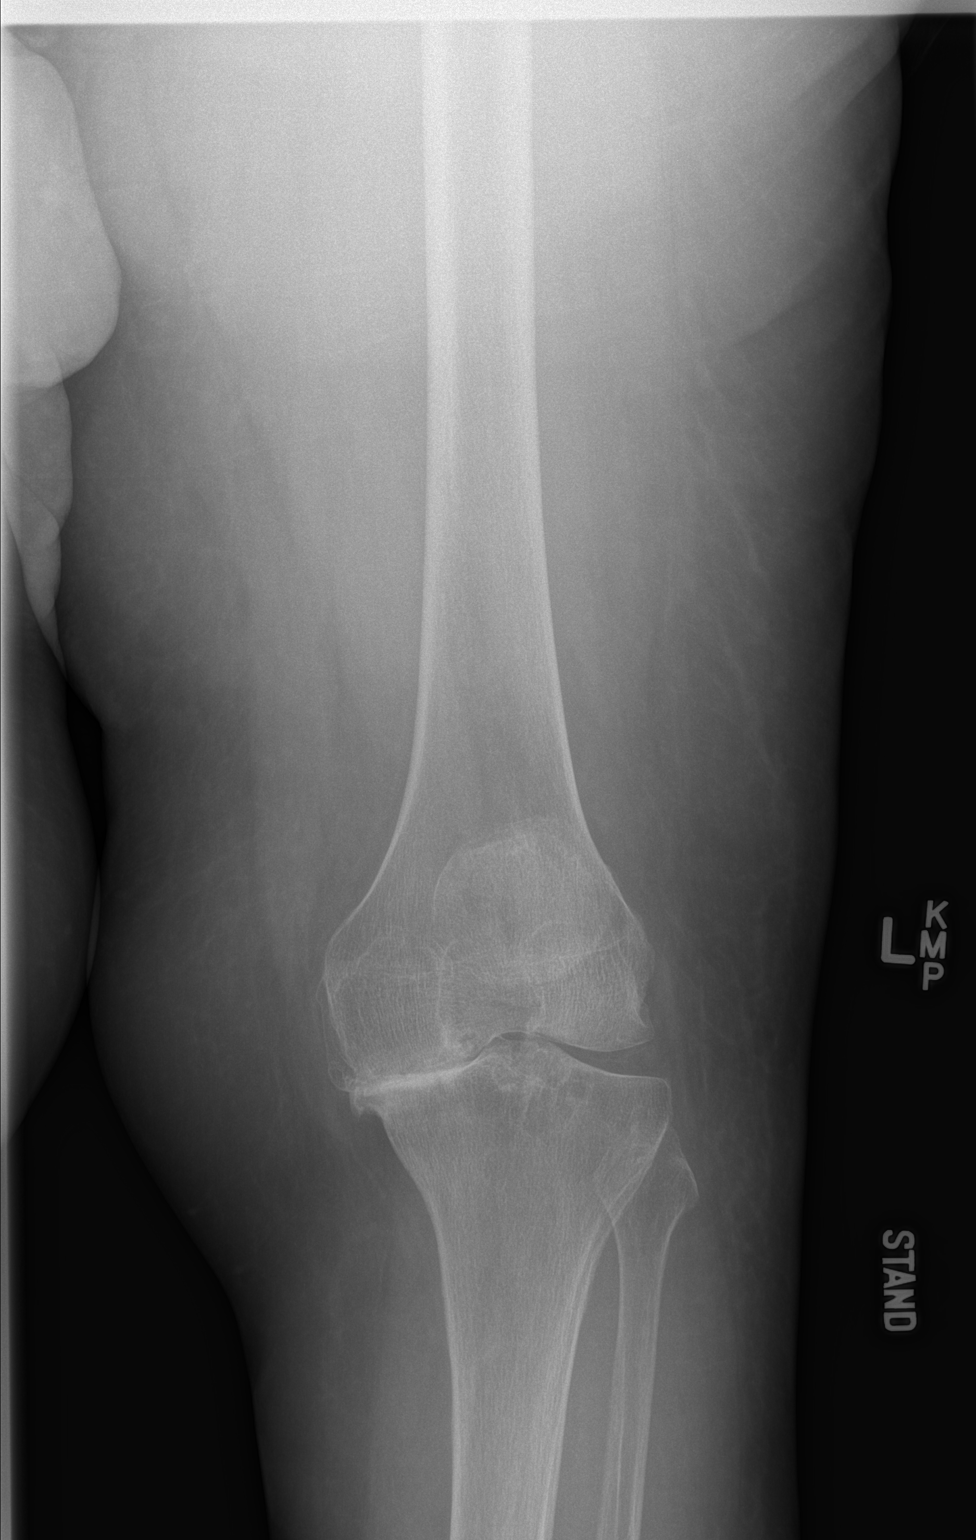

[w knee obl. left (2 of 2)]
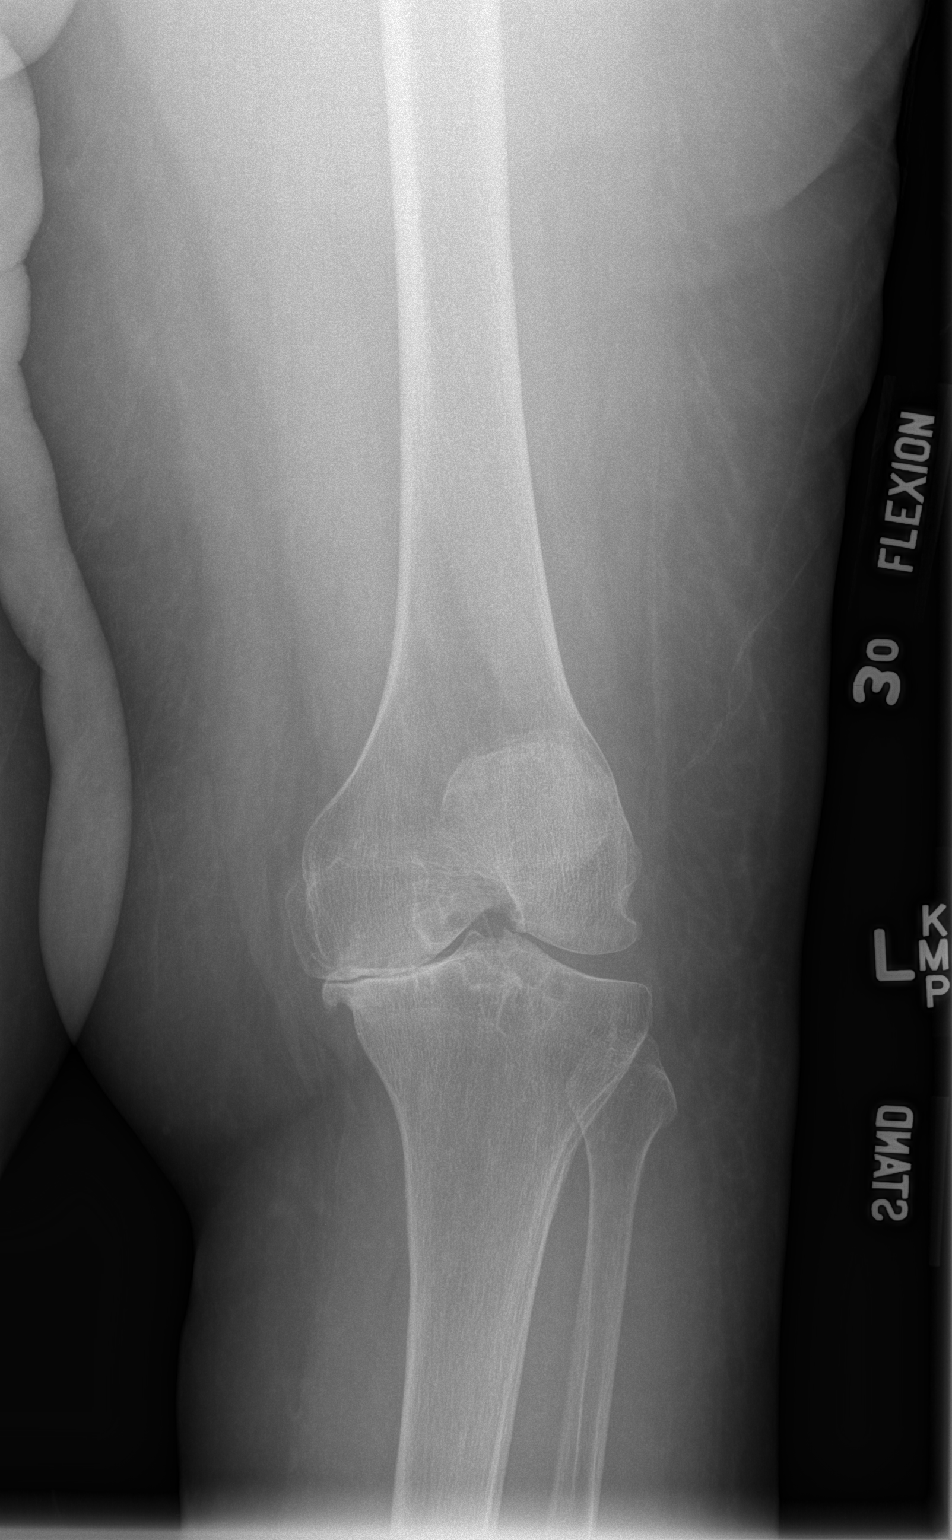

[w knee lat. left]
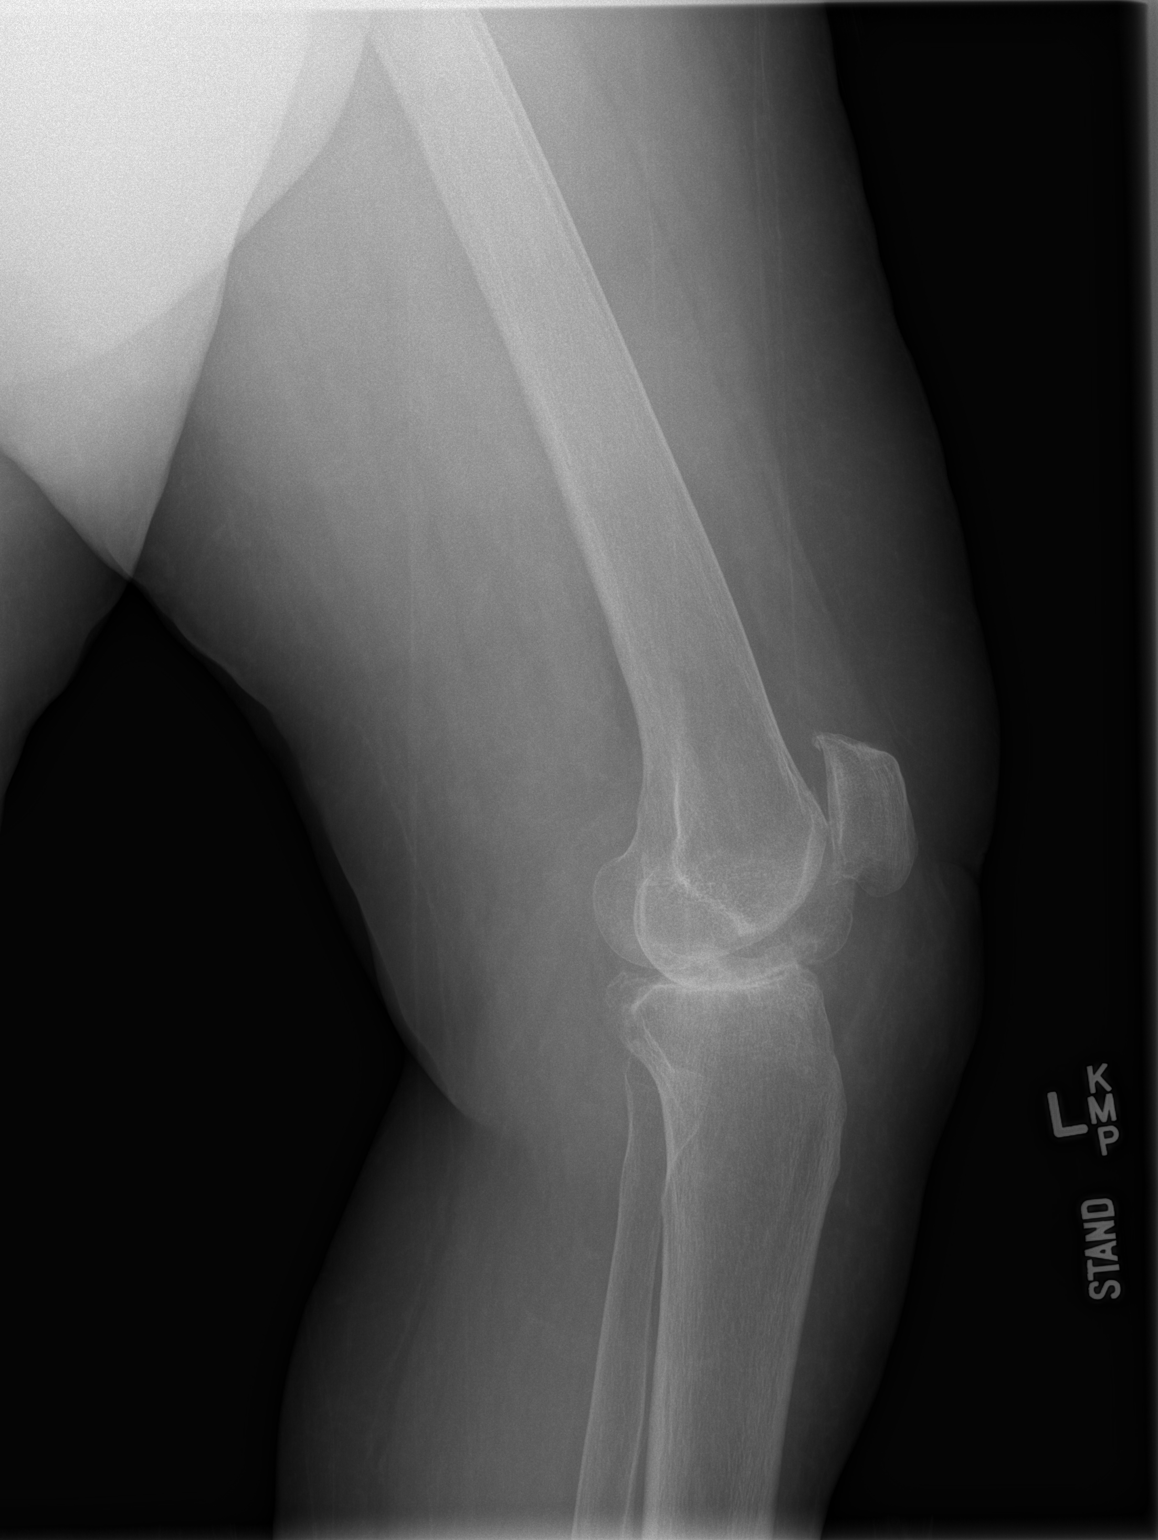

[3 of 3 positions shown; findings below may reference images not displayed]

FINDINGS: Severe joint space narrowing medially with bone-on-bone. Medial
osteophyte formation. Mild widening of the lateral joint space.
Minimal degenerative change in the patellofemoral joint. Small joint
effusion

Negative for fracture.
IMPRESSION: Advanced cartilage loss and spurring in the medial joint
compartment.

## 2015-10-01 ENCOUNTER — Telehealth: Payer: Self-pay | Admitting: Internal Medicine

## 2015-10-01 DIAGNOSIS — C229 Malignant neoplasm of liver, not specified as primary or secondary: Secondary | ICD-10-CM | POA: Diagnosis not present

## 2015-10-01 DIAGNOSIS — R34 Anuria and oliguria: Secondary | ICD-10-CM | POA: Diagnosis not present

## 2015-10-01 DIAGNOSIS — R609 Edema, unspecified: Secondary | ICD-10-CM | POA: Diagnosis not present

## 2015-10-01 NOTE — Telephone Encounter (Signed)
Pt. Daughter called stating that her mothers legs are swollen. Daughter stated when pt. rises her legs the swollen comes down a little. She would like advise from the nurse. Please f/u

## 2015-10-04 NOTE — Telephone Encounter (Signed)
Medical Assistant used [employer] to contact patient.  Interpreter Name: Anabel Bene #: [id number] Patient verified DOB Patient gives her daughter Victory Dakin) permission to speak on her behalf. Patients daughter states her mothers legs are swelling "a lot". Patient has been taking her Lasix. Patients daughter states she increased the patients dosage of Lasix to one in the morning and half in the afternoon. Patients daughter also elevated the patients leg. Patients daughter states she stopped the patients every day dose of nadolol to three times a week. Patient has been having diahrea while taking the nadolol every day. Patients daughter states swelling has decreased and patient feels more comfortable. Patient no longer has diahrrea.   Patients daughter was instructed to continue elevating patients legs. Daughter should give medications as prescribed until advised by a provider. Medical Assistant will call patients daughter by the end of Monday with provider advise and appointment date and time. Daughter expressed her appreciation and understanding.

## 2015-10-04 NOTE — Telephone Encounter (Signed)
Pt. Daughter called stating pt. Legs are swollen. Please f/u with pt.

## 2015-10-08 NOTE — Telephone Encounter (Signed)
Pt. Returned call. Please f/u with pt. °

## 2015-10-09 ENCOUNTER — Emergency Department (HOSPITAL_COMMUNITY)
Admission: EM | Admit: 2015-10-09 | Discharge: 2015-10-09 | Disposition: A | Discharge status: Home / Self Care | Payer: Medicare Other | Attending: Emergency Medicine | Admitting: Emergency Medicine

## 2015-10-09 ENCOUNTER — Encounter (HOSPITAL_COMMUNITY): Payer: Self-pay | Admitting: *Deleted

## 2015-10-09 ENCOUNTER — Emergency Department (EMERGENCY_DEPARTMENT_HOSPITAL): Payer: Medicare Other

## 2015-10-09 DIAGNOSIS — B182 Chronic viral hepatitis C: Secondary | ICD-10-CM | POA: Insufficient documentation

## 2015-10-09 DIAGNOSIS — Z872 Personal history of diseases of the skin and subcutaneous tissue: Secondary | ICD-10-CM | POA: Diagnosis not present

## 2015-10-09 DIAGNOSIS — I10 Essential (primary) hypertension: Secondary | ICD-10-CM | POA: Insufficient documentation

## 2015-10-09 DIAGNOSIS — F329 Major depressive disorder, single episode, unspecified: Secondary | ICD-10-CM | POA: Diagnosis not present

## 2015-10-09 DIAGNOSIS — K7469 Other cirrhosis of liver: Secondary | ICD-10-CM

## 2015-10-09 DIAGNOSIS — Z8719 Personal history of other diseases of the digestive system: Secondary | ICD-10-CM | POA: Diagnosis not present

## 2015-10-09 DIAGNOSIS — M7989 Other specified soft tissue disorders: Secondary | ICD-10-CM

## 2015-10-09 DIAGNOSIS — R1033 Periumbilical pain: Secondary | ICD-10-CM | POA: Insufficient documentation

## 2015-10-09 DIAGNOSIS — M79609 Pain in unspecified limb: Secondary | ICD-10-CM | POA: Diagnosis not present

## 2015-10-09 DIAGNOSIS — C229 Malignant neoplasm of liver, not specified as primary or secondary: Secondary | ICD-10-CM | POA: Insufficient documentation

## 2015-10-09 DIAGNOSIS — Z79899 Other long term (current) drug therapy: Secondary | ICD-10-CM | POA: Diagnosis not present

## 2015-10-09 DIAGNOSIS — R17 Unspecified jaundice: Secondary | ICD-10-CM | POA: Diagnosis not present

## 2015-10-09 DIAGNOSIS — B1920 Unspecified viral hepatitis C without hepatic coma: Secondary | ICD-10-CM | POA: Diagnosis not present

## 2015-10-09 DIAGNOSIS — R011 Cardiac murmur, unspecified: Secondary | ICD-10-CM | POA: Diagnosis not present

## 2015-10-09 DIAGNOSIS — Z87442 Personal history of urinary calculi: Secondary | ICD-10-CM | POA: Insufficient documentation

## 2015-10-09 HISTORY — DX: Malignant neoplasm of liver, not specified as primary or secondary: C22.9

## 2015-10-09 HISTORY — DX: Malignant (primary) neoplasm, unspecified: C80.1

## 2015-10-09 LAB — COMPREHENSIVE METABOLIC PANEL
ALT: 144 U/L — ABNORMAL HIGH (ref 14–54)
ANION GAP: 3 — AB (ref 5–15)
AST: 133 U/L — ABNORMAL HIGH (ref 15–41)
Albumin: 1.8 g/dL — ABNORMAL LOW (ref 3.5–5.0)
Alkaline Phosphatase: 127 U/L — ABNORMAL HIGH (ref 38–126)
BUN: 12 mg/dL (ref 6–20)
CHLORIDE: 107 mmol/L (ref 101–111)
CO2: 24 mmol/L (ref 22–32)
CREATININE: 0.8 mg/dL (ref 0.44–1.00)
Calcium: 8 mg/dL — ABNORMAL LOW (ref 8.9–10.3)
Glucose, Bld: 177 mg/dL — ABNORMAL HIGH (ref 65–99)
POTASSIUM: 4.1 mmol/L (ref 3.5–5.1)
Sodium: 134 mmol/L — ABNORMAL LOW (ref 135–145)
Total Bilirubin: 2.5 mg/dL — ABNORMAL HIGH (ref 0.3–1.2)
Total Protein: 5.8 g/dL — ABNORMAL LOW (ref 6.5–8.1)

## 2015-10-09 LAB — PROTIME-INR
INR: 1.85 — AB (ref 0.00–1.49)
Prothrombin Time: 21.3 seconds — ABNORMAL HIGH (ref 11.6–15.2)

## 2015-10-09 LAB — CBC
HEMATOCRIT: 36.6 % (ref 36.0–46.0)
Hemoglobin: 12.3 g/dL (ref 12.0–15.0)
MCH: 29.2 pg (ref 26.0–34.0)
MCHC: 33.6 g/dL (ref 30.0–36.0)
MCV: 86.9 fL (ref 78.0–100.0)
PLATELETS: 71 10*3/uL — AB (ref 150–400)
RBC: 4.21 MIL/uL (ref 3.87–5.11)
RDW: 17.7 % — ABNORMAL HIGH (ref 11.5–15.5)
WBC: 7.2 10*3/uL (ref 4.0–10.5)

## 2015-10-09 LAB — BRAIN NATRIURETIC PEPTIDE: B Natriuretic Peptide: 94.3 pg/mL (ref 0.0–100.0)

## 2015-10-09 MED ORDER — FUROSEMIDE 20 MG PO TABS: 20.0000 mg | ORAL_TABLET | Freq: Two times a day (BID) | ORAL | Status: DC

## 2015-10-09 NOTE — ED Notes (Signed)
Per daughter, pt is currently being treated at [facility] for liver cancer. Pt is having severe swelling to bilateral legs x 1 week. Denies any sob.

## 2015-10-09 NOTE — ED Notes (Signed)
Legs elevated with blankets

## 2015-10-09 NOTE — Progress Notes (Signed)
VASCULAR LAB PRELIMINARY  PRELIMINARY  PRELIMINARY  PRELIMINARY  Bilateral lower extremity venous duplex  completed.    Preliminary report:  Bilateral:  No evidence of DVT, superficial thrombosis, or Baker's Cyst.    Isaiyah Feldhaus, RVT 10/09/2015, 8:26 PM

## 2015-10-09 NOTE — ED Provider Notes (Signed)
CSN: [id number]     Arrival date & time 10/09/15  1649 History   First MD Initiated Contact with Patient 10/09/15 1831     Chief Complaint  Patient presents with  . Leg Swelling     (Consider location/radiation/quality/duration/timing/severity/associated sxs/prior Treatment) HPI Comments: 69 year old female with past medical history including Hep C c/b liver cancer on chemotherapy, recent CVA, hypertension who presents with lower extremity edema. Patient is Spanish-speaking and history obtained with the assistance of her daughters. They state that the patient is currently undergoing chemotherapy at [facility] for liver cancer. She began having problems with bilateral lower extremity edema, right greater than left, one week ago which has significantly worsened over the past few days. The patient endorses generalized leg pain. She has intermittent abdominal pain that has been chronic. No new abdominal pain. No shortness of breath or chest pain. No cough/cold symptoms, vomiting, fevers, confusion, or changes in medications. No history of blood clots. The patient was hospitalized one month ago for acute stroke.  The history is provided by a relative.    Past Medical History  Diagnosis Date  . Ulcer   . Hepatitis C   . Refusal of blood transfusions as patient is Jehovah's Witness   . Hypertension   . 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 From Hep C.  Xifaxin causesd severe abdominal pain. Was given to reduce confusion from cirrhosis.    . Hepatitis C 01/21/2015    Under care Dr. Burnetta Sabin. Starting to treat hep c per patient.    . Depression 08/24/2014    Has a sister that is very sick and dealing with stress of this   . Nephrolithiasis 08/24/2014  . Arthritis 08/24/2014    Knees and ankles   . Cancer ([place])   . Liver cancer [facility])    Past Surgical History  Procedure Laterality Date  . Partial hysterectomy    . Knee arthroscopy Left   .  Esophagogastroduodenoscopy N/A 05/09/2014    Procedure: ESOPHAGOGASTRODUODENOSCOPY (EGD);  Surgeon: Irene Shipper, MD;  Location: [facility] ENDOSCOPY;  Service: Endoscopy;  Laterality: N/A;  . Abdominal hysterectomy    . Knee surgery      "removed something"  . Tonsilectomy/adenoidectomy with myringotomy     Family History  Problem Relation Age of Onset  . Pulmonary fibrosis Sister   . Kidney disease Sister    Social History  Substance Use Topics  . Smoking status: Never Smoker   . Smokeless tobacco: None  . Alcohol Use: No     Comment: quit 24 years ago   OB History    Gravida Para Term Preterm AB TAB SAB Ectopic Multiple Living   0 0 0 0 0 0 0 0       Review of Systems 10 Systems reviewed and are negative for acute change except as noted in the HPI.    Allergies  Review of patient's allergies indicates no known allergies.  Home Medications   Prior to Admission medications   Medication Sig Start Date End Date Taking? Authorizing Provider  atorvastatin (LIPITOR) 80 MG tablet Take 1 tablet (80 mg total) by mouth daily. 09/11/15  Yes Arnoldo Morale, MD  nadolol (CORGARD) 20 MG tablet Take 1 tablet (20 mg total) by mouth daily. 09/11/15  Yes Arnoldo Morale, MD  pantoprazole (PROTONIX) 40 MG tablet Take 1 tablet (40 mg total) by mouth daily. 08/01/15  Yes Tresa Garter, MD  spironolactone (ALDACTONE) 50  MG tablet Take 1 tablet (50 mg total) by mouth daily. 08/01/15  Yes Olugbemiga Essie Christine, MD  traMADol (ULTRAM) 50 MG tablet Take 1 tablet (50 mg total) by mouth every 8 (eight) hours as needed. 09/11/15  Yes Arnoldo Morale, MD  furosemide (LASIX) 20 MG tablet Take 1 tablet (20 mg total) by mouth 2 (two) times daily. 10/09/15   Wenda [place] Borchardt, MD   BP 116/52 mmHg  Pulse 74  Temp(Src) 98.3 F (36.8 C) (Oral)  Resp 23  Ht 4\' 11"  (1.499 m)  Wt 156 lb (70.761 kg)  BMI 31.49 kg/m2  SpO2 100% Physical Exam  Constitutional: She is oriented to person, place, and time. She appears  well-developed and well-nourished.  Uncomfortable but no acute distress  HENT:  Head: Normocephalic and atraumatic.  Mouth/Throat: Oropharynx is clear and moist.  Moist mucous membranes  Eyes: Pupils are equal, round, and reactive to light. Scleral icterus is present.  Neck: Neck supple.  Cardiovascular: Normal rate and regular rhythm.   3/6 systolic murmur  Pulmonary/Chest: Effort normal. No respiratory distress.  Faint crackles bilateral bases  Abdominal: Soft. Bowel sounds are normal. She exhibits no distension.  Mild right periumbilical abdominal tenderness, no rebound or guarding  Musculoskeletal:  2+ pitting edema b/l LE R>L to thighs w/ diffuse tenderness to palpation  Neurological: She is alert and oriented to person, place, and time.  Fluent speech  Skin: Skin is warm and dry.  Spider angiomata scattered on neck and chest  Psychiatric: She has a normal mood and affect. Judgment normal.  Nursing note and vitals reviewed.   ED Course  Procedures (including critical care time) Labs Review Labs Reviewed  COMPREHENSIVE METABOLIC PANEL - Abnormal; Notable for the following:    Sodium 134 (*)    Glucose, Bld 177 (*)    Calcium 8.0 (*)    Total Protein 5.8 (*)    Albumin 1.8 (*)    AST 133 (*)    ALT 144 (*)    Alkaline Phosphatase 127 (*)    Total Bilirubin 2.5 (*)    Anion gap 3 (*)    All other components within normal limits  CBC - Abnormal; Notable for the following:    RDW 17.7 (*)    Platelets 71 (*)    All other components within normal limits  PROTIME-INR - Abnormal; Notable for the following:    Prothrombin Time 21.3 (*)    INR 1.85 (*)    All other components within normal limits  BRAIN NATRIURETIC PEPTIDE    Imaging Review No results found. I have personally reviewed and evaluated these lab results as part of my medical decision-making.   EKG Interpretation None      MDM   Final diagnoses:  Leg swelling  HCC secondary to Hep C     69 year old female with liver cancer secondary to hepatitis C on chemotherapy who presents with 1 week of worsening bilateral lower extremity edema. Patient nontoxic at presentation with normal vital signs. Pitting edema bilaterally, right worse than left. Obtained above labs to evaluate for worsening liver disease, heart failure, or kidney injury. Given patient's known cancer, obtained bilateral lower extremity ultrasounds to rule out DVT.  Labwork is consistent with patient's known liver disease with stable INR, LFTs, and bilirubin. Albumin low at 1.8 and protein low at 5.8; both values are lower than most recent lab work. BNP is normal which is reassuring against CHF and creatinine normal reassuring against kidney disease. Ultrasounds were  negative for DVT. I suspect that the patient's symptoms are due to hypoalbuminemia secondary to her liver process. I've instructed to increase lasix to 20mg  BID and call hepatologist in the morning to schedule f/u appt. and instructed on supportive care including compression stockings and elevation. Return precautions reviewed. Family voiced understanding of plan and patient discharged in satisfactory condition.  Sharlett Iles, MD 10/09/15 2209

## 2015-10-09 NOTE — ED Notes (Signed)
Pt from home with family for eval of bilateral leg swelling x2 days, pt family states swelling in left leg has decreased however swelling extends up to pt hips. Pt with recent liver cancer diagnosis and started chemo x2 months ago. Pt with fine crackles to right side of lungs, denies any cp or sob. Pt alert, skin warm and dry. Family states pt has been taking lasix.

## 2015-10-09 NOTE — Discharge Instructions (Signed)
YOUR LABWORK SHOWED LOW ALBUMIN AND LOW TOTAL PROTEIN LEVELS, WHICH ARE DUE TO YOUR LIVER DISEASE. THESE LOW LEVELS ARE CONTRIBUTING TO YOUR LEG SWELLING.  PLEASE START TAKING YOUR LASIX (FUROSEMIDE) 1 TABLET (20MG ) TWICE DAILY AND CONTACT YOUR LIVER SPECIALIST TOMORROW FOR A FOLLOW UP APPOINTMENT AS SOON AS POSSIBLE.

## 2015-10-10 NOTE — Telephone Encounter (Signed)
Pt. Returned call. Pt. Stated that CMA called her. Please f/u.

## 2015-10-24 ENCOUNTER — Encounter (HOSPITAL_BASED_OUTPATIENT_CLINIC_OR_DEPARTMENT_OTHER): Payer: Medicare Other | Admitting: Clinical

## 2015-10-24 ENCOUNTER — Ambulatory Visit: Payer: Medicare Other | Attending: Internal Medicine | Admitting: Internal Medicine

## 2015-10-24 DIAGNOSIS — N3946 Mixed incontinence: Secondary | ICD-10-CM | POA: Insufficient documentation

## 2015-10-24 DIAGNOSIS — K219 Gastro-esophageal reflux disease without esophagitis: Secondary | ICD-10-CM | POA: Diagnosis not present

## 2015-10-24 DIAGNOSIS — R5381 Other malaise: Secondary | ICD-10-CM | POA: Diagnosis not present

## 2015-10-24 DIAGNOSIS — I10 Essential (primary) hypertension: Secondary | ICD-10-CM

## 2015-10-24 DIAGNOSIS — R197 Diarrhea, unspecified: Secondary | ICD-10-CM | POA: Insufficient documentation

## 2015-10-24 DIAGNOSIS — Z7189 Other specified counseling: Secondary | ICD-10-CM

## 2015-10-24 DIAGNOSIS — C220 Liver cell carcinoma: Secondary | ICD-10-CM

## 2015-10-24 DIAGNOSIS — A09 Infectious gastroenteritis and colitis, unspecified: Secondary | ICD-10-CM | POA: Insufficient documentation

## 2015-10-24 MED ORDER — DEPEND UNDERWEAR X-LARGE MISC: Status: DC

## 2015-10-24 MED ORDER — PANTOPRAZOLE SODIUM 40 MG PO TBEC: 40.0000 mg | DELAYED_RELEASE_TABLET | Freq: Every day | ORAL | Status: DC

## 2015-10-24 MED ORDER — TOILET SEAT ELEVATOR MISC: Status: DC

## 2015-10-24 NOTE — Patient Instructions (Signed)
Diet and Hepatitis Chronic hepatitis infection affects the functioning of your liver and the way your body uses energy from food. Making the proper adjustments in your diet can help to protect your liver and prevent further complications from your condition. You may also be at an increased risk for malnutrition because of the side effects of hepatitis treatment. Common side effects of hepatitis treatment include poor appetite, nausea, and vomiting. It is important that you eat a balanced diet with enough calories to support your body's energy needs and maintain a healthy weight. WHAT IS MY PLAN? Your health care provider or dietitian may make specific diet recommendations based on your condition.  Sodium (mg/day): ________  Fluids (oz/day): ________  Protein (oz/day): ________  Vitamin and mineral supplements: ____________________________ WHAT DO I NEED TO KNOW ABOUT THIS DIET?  Limit foods that are high in fat, sugar, and salt (sodium). Do not add extra salt to food.  Avoid processed foods. Check food labels for dietary information.  Do not eat uncooked shellfish.  Avoid dehydration by drinking enough fluid to keep your urine clear or pale yellow or as directed. Limit your fluid intake only if recommended by your health care provider.  Grill, boil, or bake foods instead of frying. Do not use pots and pans made of iron.  Try eating frequent small meals instead of three large meals each day.  Avoid or limit drinks that contain alcohol, such as beer, wine, and hard liquor.  Maintain a clean meal preparation and eating space. Wash your hands before and after preparing food and eating.  If you are underweight, your health care provider may recommend a high-calorie diet.  Take vitamin and mineral supplements only as directed by your dietitian.  Make any dietary changes that are recommended by your health care provider or your dietitian. You may need to adjust the amount of certain foods  and substances that you eat. WHAT FOODS CAN I EAT? Grains Whole-grain bread, tortillas, cereals, and pasta. Brown rice. Oats and oatmeal. Vegetables Carrots, broccoli, cabbage, celery, cucumbers, and potatoes. Frozen vegetables. Low-sodium canned vegetables. Fruits Apples, bananas, pears, apricots, grapes, and cherries. Canned fruit (in juice or water). Meats and Other Protein Sources Lean meat and poultry. Fish. Tofu. Eggs. Nuts and nut butters. Beans. Dairy Low-fat yogurt. Low-fat cottage cheese. Low-fat cheese. Milk shakes. Beverages Water. Low-fat milk. 100% fruit juice. Low-sodium vegetable juice. Smoothies. Herbal tea. Condiments Low-fat mayonnaise. Low-sodium soy sauce. Sweets and Desserts Low-fat cookies. Low-fat bran muffins. Fats and Oils [place]ola oil, olive oil, corn oil, sunflower oil, peanut oil, and flaxseed oil. Other Baked chips. Whole-grain crackers. Powdered protein supplements (check label for sodium, fat, and iron content). The items listed above may not be a complete list of recommended foods or beverages. Contact your dietitian for more options. WHAT FOODS ARE NOT RECOMMENDED? Food adjustments will be different for each person with chronic hepatitis infection. Be sure to see a dietitian who can help you determine the specific adjustments that you will need to make for each of the food groups. Foods and food ingredients that are commonly not recommended include: Grains Iron-fortified cereals and breads. Vegetables Fermented vegetables, such as sauerkraut and pickles. Canned vegetables that are high in sodium. Dark green leafy vegetables, such as spinach and kale. Fruits Raisins. Meats and Other Protein Sources Red meat. Pork. Chicken. Shellfish. Fish. Beans. Salted nuts. Salted or cured meats. Condiments Ketchup. Mustard. Barbecue sauce. Fats and Oils Animal fats, such as butter, lard, or ghee. Fruit oils, such  as coconut oil or avocado oil.  Other Salted  snacks, such as potato chips or pretzels. Canned soups. Frozen dinners. Processed foods. Multivitamins and supplements that contain iron.  The items listed above may not be a complete list of foods and beverages to avoid. Contact your dietitian for more information.   This information is not intended to replace advice given to you by your health care provider. Make sure you discuss any questions you have with your health care provider.   Document Released: 11/30/2005 Document Revised: 12/21/2014 Document Reviewed: 06/27/2014 Elsevier Interactive Patient Education 2016 Minco heptica (Liver Failure) La insuficiencia heptica es una afeccin en la que el hgado pierde su capacidad de funcionar debido a una lesin o una enfermedad. El hgado es un rgano de gran tamao ubicado en la parte superior derecha del abdomen. Participa en muchas funciones importantes, entre ellas, Sports coach, producir los lquidos que el cuerpo necesita y eliminar sustancias perjudiciales del torrente sanguneo. La insuficiencia heptica puede evolucionar rpidamente, a lo largo de das o semanas (insuficiencia heptica [place]). Su evolucin tambin puede ser gradual, a lo largo de meses o aos (insuficiencia heptica crnica).  CAUSAS Hay muchas causas posibles de la insuficiencia heptica, entre ellas:  Una infeccin en el hgado (hepatitis viral).  El consumo excesivo de alcohol.  Una sobredosis de determinados medicamentos. La sobredosis de paracetamol es una causa frecuente.  Enfermedad heptica.  Ingesta de sustancias txicas de hongos o de moho. SNTOMAS  Los sntomas de esta afeccin pueden incluir lo siguiente:  Land de la piel y la zona blanca de los ojos (ictericia).  Fcil aparicin de moretones.  Hemorragia persistente.  Picazn en la piel (prurito).  Lneas rojas en forma de araa en la piel.  Prdida del apetito.  Nuseas.  Vmitos de  [place].  Deposiciones sanguinolentas.  Prdida de peso.  Prdida de la masa muscular.  Movimientos musculares espasmdicos o flcidos, especialmente con movimientos de las manos (asterixis).  Acumulacin de lquido en el vientre (ascitis).  Disminucin de la cantidad [place]. Este puede ser un signo que indica que los riones han dejado de funcionar (insuficiencia renal).  Confusin o somnolencia.  Dificultad para dormir.  Cambios en el estado de nimo y la personalidad.  Infecciones frecuentes.  Aliento con [employer] o rancio.  Dificultad para respirar. DIAGNSTICO  Esta afeccin se diagnostica en funcin de los sntomas, la historia clnica y un examen fsico. Tambin pueden hacerle exmenes que incluyen lo siguiente:  Anlisis de [place].  Tomografa computarizada.  Resonancia magntica.  Ecografa.  La extraccin de [place] de tejido heptico para examinarla con un microscopio (biopsia). [place] causa y de la gravedad de Engineer, manufacturing systems. El tratamiento de la insuficiencia heptica crnica puede incluir lo siguiente:  Medicamentos para ayudar a Psychologist, educational.  Cambios en el estilo de vida, por ejemplo, limitar la cantidad de sal y de Merchant navy officer en la dieta. Las carnes rojas, el pescado y los productos lcteos son algunos de los alimentos que contienen protenas [place]. La insuficiencia heptica aguda o avanzada (en etapa terminal) puede requerir la hospitalizacin. El tratamiento puede incluir lo siguiente:  Antibiticos.  Lquidos por va intravenosa (IV) que contengan azcar (glucosa) y minerales (electrolitos).  La evacuacin de las sustancias txicas del organismo con medicamentos (lactulosa) o mediante un procedimiento de lavado (enema).  La incorporacin de ciertas cantidades de la parte lquida de la sangre (plasma) en el torrente sanguneo (recibir  una transfusin).  El uso de un rin  artificial para Curator sangre (hemodilisis) en caso de insuficiencia renal.  Asistencia respiratoria y un tubo endotraqueal (respirador).  Trasplante de hgado. Se trata de una ciruga para reemplazar el hgado por el hgado de otra persona (hgado donado). Esta puede ser la mejor opcin si el hgado ha dejado de funcionar por completo. [place] los medicamentos de venta libre y los recetados solamente como se lo haya indicado el mdico.  No beba alcohol.  No consuma productos que contengan tabaco, incluidos cigarrillos, tabaco de Higher education careers adviser o cigarrillos electrnicos. Si necesita ayuda para dejar de fumar, consulte al [employer].  Siga las indicaciones del mdico respecto de las restricciones para las comidas y las bebidas. Esto puede incluir lo siguiente:  Limitar la cantidad de [employer] que consume.  Aumentar la cantidad de protenas vegetales que consume. Algunos de los alimentos que contienen protenas vegetales son los cereales integrales, los frutos secos y las verduras.  Tomar suplementos vitamnicos.  Limitar la cantidad de sal que consume.  Siga las indicaciones del mdico acerca de cmo mantener al da las vacunas, especialmente contra la hepatitisA yB.  Haga actividad fsica habitualmente como se lo haya indicado el mdico.  Concurra a todas las visitas de control como se lo haya indicado el mdico. Esto es importante. SOLICITE ATENCIN MDICA SI:  Los sntomas empeoran.  Baja mucho de peso sin proponrselo.  Tiene fiebre o siente escalofros. SOLICITE ATENCIN MDICA DE INMEDIATO SI:  Est confundido o muy somnoliento.  No puede cuidarse solo o no pueden cuidarlo en su casa.  No puede orinar.  Tiene dificultad para respirar.  Vomita sangre.   Esta informacin no tiene Marine scientist el consejo del mdico. Asegrese de hacerle al mdico cualquier pregunta que tenga.   Document Released:  08/21/2015 Elsevier Interactive Patient Education [employer]. Liver Failure Liver failure is a condition in which the liver loses its ability to function due to injury or disease. The liver is a large organ in the upper right-hand side of the abdomen. It is involved in many important functions, including storing energy, producing fluids that the body needs, and removing harmful substances from the bloodstream. Liver failure can develop quickly, over days or weeks (acute liver failure). It can also develop gradually, over months or years (chronicliver failure).  CAUSES There are many possible causes of liver failure, including:  Liver infection (viral hepatitis).  Alcohol abuse.  An overdose of certain medicines. Acetaminophen overdose is a common cause.  Liver disease.  Ingesting poison from mushrooms or mold. SYMPTOMS  Symptoms of this condition may include:  Yellowing of the skin and the whites of the eyes (jaundice).  Bruising easily.  Persistent bleeding.  Itchy skin (pruritus).  Red, spider-like lines on the skin.  Loss of appetite.  Nausea.  Vomiting blood.  Bloody bowel movements.  Weight loss.  Muscle loss.  Jerky or floppy muscle movements, especially with hand movements (asterixis).  Fluid buildup in the belly (ascites).  Decreased urination. This may be a sign that your kidneys have stopped working (renal failure).  Confusion and sleepiness.  Difficulty sleeping.  Mood and personality changes.  Frequent infections.  Breath that smells sweet or musty.  Difficulty breathing. DIAGNOSIS  This condition is diagnosed based on your symptoms, your medical history, and a physical exam. You may have tests, including:  Blood tests.  CT scan.  MRI.  Ultrasound.  Removal of  a small amount of liver tissue to be examined under a microscope (biopsy). TREATMENT  Treatment depends on the cause and severity of your condition. Treatment for  chronic liver failure may include:  Medicines to help reduce symptoms.  Lifestyle changes, such as limiting salt and animal proteins in your diet. Foods that contain animal proteins include red meat, fish, and dairy products. Acute or advanced (end stage) liver failure may require hospitalization. Treatment may include:  Antibiotic medicine.  IV fluids that contain sugar (glucose) and minerals (electrolytes).  Flushing out toxic substances from the body using medicine (lactulose) or a cleansing procedure (enema).  Adding certain amounts of the liquid part of blood (plasma) to your bloodstream (receiving a transfusion).  Using an artificial kidney to filter your blood (hemodialysis) if you have renal failure.  Breathing support and a breathing tube (respirator).  Liver transplant. This is a surgery to replace your liver with another person's liver (donor liver). This may be the best option if your liver has completely stopped functioning. HOME CARE INSTRUCTIONS  Take over-the-counter and prescription medicines only as told by your health care provider.  Do not drink alcohol.  Do not use tobacco products, including cigarettes, chewing tobacco, or e-cigarettes. If you need help quitting, ask your health care provider.  Follow instructions from your health care provider about eating and drinking restrictions. This may include:  Limiting the amount of animal protein that you eat.  Increasing the amount of plant-based protein that you eat. Foods that contain plant-based proteins include whole grains, nuts, and vegetables.  Taking vitamin supplements.  Limiting the amount of salt that you eat.  Follow instructions from your health care provider about maintaining your vaccinations, especially vaccinations against hepatitis A and B.  Exercise regularly, as told by your health care provider.  Keep all follow-up visits as told by your health care provider. This is important. SEEK  MEDICAL CARE IF:  You have symptoms that get worse.  You lose a lot of weight without trying.  You have a fever or chills. SEEK IMMEDIATE MEDICAL CARE IF:  You become confused or very sleepy.  You cannot take care of yourself or be taken care of at home.  You are not urinating.  You have difficulty breathing.  You vomit blood.   This information is not intended to replace advice given to you by your health care provider. Make sure you discuss any questions you have with your health care provider.   Document Released: 08/21/2015 Document Reviewed: 12/19/2014 Elsevier Interactive Patient Education [employer].

## 2015-10-24 NOTE — Progress Notes (Signed)
ASSESSMENT: Pt currently in need of DME; she needs to f/u with PCP, and family may benefit from supportive counseling  Stage of Change: uncertain  PLAN: 1. F/U with behavioral health consultant in as needed 2. Psychiatric Medications: none. 3. Behavioral recommendation(s):   -Daughter to medical supply store, with DME prescriptions, pick up toilet seat elevator and shower chair -Have someone answer phone (or call back) when medical supply company calls about delivering hospital bed SUBJECTIVE: Pt. referred by Dr Doreene Burke for DME:  Pt. reports the following symptoms/concerns: Pt says she needs hospital bed, shower chair and help getting on and off toilet.  Duration of problem: Recent, as legs have been swelling Severity: mild  OBJECTIVE: Orientation & Cognition: Oriented x3. Thought processes normal and appropriate to situation. Mood: appropriate. Affect: appropriate Appearance: appropriate, in wheelchair Risk of harm to self or others: no known risk of harm to self or others Substance use: none Assessments administered: PHQ9: 7  Diagnosis: Counseling and coordination of care CPT Code: Z71.89 -------------------------------------------- Other(s) present in the room: daughter  Time spent with patient in exam room: 10 minutes

## 2015-10-24 NOTE — Progress Notes (Signed)
Patient here for bilateral leg swelling  Patient complains of pain in bilateral legs as well as swelling being present for over a month. Pain is scaled at a 5 at this time.  Patient complains of small amount of blood in stool.  Patient states she is fatigued majority of the day. Patient is not taking spironolactone and nadolol

## 2015-10-24 NOTE — Progress Notes (Signed)
Patient ID: Ebbie Matherly, female   DOB: 02/25/1946, 69 y.o.   MRN: [id number]   Clay Alviar, is a 69 y.o. female  [id number]  [id number]  DOB - 10/05/1946  No chief complaint on file.       Subjective:   Kampbell Difelice is a 69 y.o. female with history of hypertension, hepatocellular carcinoma (s/p TACE 10 weeks ago), bilateral knee osteoarthritis status post cortisone injections who was recently hospitalized at [facility] for TIA here today for a follow up visit. She continues to have symptoms of end-stage liver disease including occasional blood in stool, massive swelling of legs and thighs, generalized weakness. Patient is known to have high INR and thrombocytopenia, at risk for bleeding. Patient is requesting hospital bed and toilet seat elevators as well as shower assistance devices. Patient generally feels weak and tired. She is currently on chemotherapy for hepatocellular carcinoma. She is present today with her daughter, and an interpreter phone line was used for this encounter. Patient denies any cough, no shortness of breath, Patient has No headache, No chest pain. Patient has not been taking her diuretics because of frequent urination to the extent that she uses adult diapers. And since she stopped taking the medication her swelling had increased. She is not able to elevate her legs at night due to uncomfortable bedding situation, first the bed is too high and 2nd she has to use too many pillows that is uncomfortable.  Problem  Gastroesophageal Reflux Disease Without Esophagitis  Essential Hypertension  Mixed Incontinence  Bloody Diarrhea    ALLERGIES: No Known Allergies  PAST MEDICAL HISTORY: Past Medical History  Diagnosis Date  . Ulcer   . Hepatitis C   . Refusal of blood transfusions as patient is Jehovah's Witness   . Hypertension   . Hepatic cirrhosis ([place]) 01/21/2015    Lasix 20mg , nadolol to prevent varices from bleeding. From Hep C.  Xifaxin causesd  severe abdominal pain. Was given to reduce confusion from cirrhosis.    . Hepatitis C 01/21/2015    Under care Dr. Burnetta Sabin. Starting to treat hep c per patient.    . Depression 08/24/2014    Has a sister that is very sick and dealing with stress of this   . Nephrolithiasis 08/24/2014  . Arthritis 08/24/2014    Knees and ankles   . Cancer ([place])   . Liver cancer [facility])     MEDICATIONS AT HOME: Prior to Admission medications   Medication Sig Start Date End Date Taking? Authorizing Provider  atorvastatin (LIPITOR) 80 MG tablet Take 1 tablet (80 mg total) by mouth daily. 09/11/15   Arnoldo Morale, MD  furosemide (LASIX) 20 MG tablet Take 1 tablet (20 mg total) by mouth 2 (two) times daily. 10/09/15   Sharlett Iles, MD  Incontinence Supply Disposable (DEPEND UNDERWEAR X-LARGE) MISC Use as needed 10/24/15   Tresa Garter, MD  nadolol (CORGARD) 20 MG tablet Take 1 tablet (20 mg total) by mouth daily. 09/11/15   Arnoldo Morale, MD  pantoprazole (PROTONIX) 40 MG tablet Take 1 tablet (40 mg total) by mouth daily. 10/24/15   Tresa Garter, MD  spironolactone (ALDACTONE) 50 MG tablet Take 1 tablet (50 mg total) by mouth daily. 08/01/15   Tresa Garter, MD  traMADol (ULTRAM) 50 MG tablet Take 1 tablet (50 mg total) by mouth every 8 (eight) hours as needed. 09/11/15   Arnoldo Morale, MD     Objective:   There were no vitals filed for  this visit.  Exam General appearance : Awake, alert, not in any distress. Speech Clear. Chronically ill-looking HEENT: Atraumatic and Normocephalic, pupils equally reactive to light and accomodation Neck: supple, no JVD. No cervical lymphadenopathy.  Chest: Good air entry bilaterally, no added sounds  CVS: S1 S2 regular, no murmurs.  Abdomen: Bowel sounds present, tenderness around the epigastric and hypogastric region on the right, with no gaurding, rigidity or rebound. Extremities: B/L Lower Ext massive pedal edema up to the thighs, both legs are warm  to touch Neurology: Awake alert, and oriented X 3, CN II-XII intact, Non focal   Data Review No results found for: HGBA1C   Assessment & Plan   1. Gastroesophageal reflux disease without esophagitis  - pantoprazole (PROTONIX) 40 MG tablet; Take 1 tablet (40 mg total) by mouth daily.  Dispense: 90 tablet; Refill: 3  2. Essential hypertension  We have discussed target BP range and blood pressure goal. I have advised patient to check BP regularly and to call us back or report to clinic if the numbers are consistently higher than 140/90. We discussed the importance of compliance with medical therapy and DASH diet recommended, consequences of uncontrolled hypertension discussed.   - continue current BP medications  3. Hepatocellular carcinoma ([place])  - Ambulatory referral to Gastroenterology - Follow-up with oncologist  4. Mixed incontinence  - Incontinence Supply Disposable (DEPEND UNDERWEAR X-LARGE) MISC; Use as needed  Dispense: 26 each; Refill: 11  5. Bloody diarrhea  - Ambulatory referral to Gastroenterology  6. Debility and anasarca  Patient has medical condition that requires positioning of the body and was not feasible with an automatic bed. Patient needs to elevate both legs about 30-45 every night because of massive edema and consequent pain.  Interpreter was used to communicate directly with patient for the entire encounter including providing detailed patient instructions.   Return in about 4 weeks (around 11/21/2015) for Follow up Pain and comorbidities, hepatocellular carcinoma, Abdominal Pain.  The patient was given clear instructions to go to ER or return to medical center if symptoms don't improve, worsen or new problems develop. The patient verbalized understanding. The patient was told to call to get lab results if they haven't heard anything in the next week.   This note has been created with Surveyor, quantity. Any  transcriptional errors are unintentional.    [name], MD, [place], [place], [place], [place] and [place], [place]   10/24/2015, 5:10 PM

## 2015-10-30 ENCOUNTER — Other Ambulatory Visit: Payer: Self-pay | Admitting: *Deleted

## 2015-10-30 MED ORDER — ADJUSTABLE COMMODE 3-IN-1 MISC: 1.0000 | Status: AC | PRN

## 2015-10-30 MED ORDER — TRANSFER BENCH MISC: 1.0000 | Status: DC | PRN

## 2015-10-30 MED ORDER — TRANSFER BENCH MISC: 1.0000 | Status: AC | PRN

## 2015-10-30 MED ORDER — ADJUSTABLE COMMODE 3-IN-1 MISC: 1.0000 | Status: DC | PRN

## 2015-11-04 ENCOUNTER — Ambulatory Visit (INDEPENDENT_AMBULATORY_CARE_PROVIDER_SITE_OTHER): Payer: Medicare Other | Admitting: Gastroenterology

## 2015-11-04 ENCOUNTER — Encounter: Payer: Self-pay | Admitting: Gastroenterology

## 2015-11-04 VITALS — BP 118/70 | HR 66 | Ht 59.0 in | Wt 190.0 lb

## 2015-11-04 DIAGNOSIS — K7469 Other cirrhosis of liver: Secondary | ICD-10-CM | POA: Diagnosis not present

## 2015-11-04 DIAGNOSIS — K602 Anal fissure, unspecified: Secondary | ICD-10-CM | POA: Diagnosis not present

## 2015-11-04 DIAGNOSIS — K625 Hemorrhage of anus and rectum: Secondary | ICD-10-CM

## 2015-11-04 DIAGNOSIS — C220 Liver cell carcinoma: Secondary | ICD-10-CM

## 2015-11-04 MED ORDER — DILTIAZEM GEL 2 %: 1.0000 "application " | Freq: Three times a day (TID) | CUTANEOUS | Status: DC

## 2015-11-04 NOTE — Progress Notes (Signed)
     11/04/2015 Christasia Trench [id number] July 26, 1946   History of Present Illness:   This is a 69 year old female who is known to Dr. Henrene Pastor during one hospitalization in May 2015 for hematemesis in the setting of cirrhosis. 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 She was also found to have some liver lesions on CT scan and MRI showed a 12 mm lesion in the right hepatic lobe as well as a smaller lesion in the eft hepatic lobe. AFP was normal at that time. MRI was repeated at 6 month interval and showed increase in size of lesions suspicious for HCC. She was referred to [facility], where she has followed since that time. I did look at some records in [place]. She has been seeing hepatology/transplant and oncology there. She underwent TACE in July 2016.  She was actually last seen by hepatology transplant in May and they wanted to see her back in 3 months, but she has not been there since that time. She's currently on Lasix 20 mg twice daily and spironolactone 50 mg daily for her fluid retention. She also takes pantoprazole for 40 mg daily. She is not on any beta blocker, however, had been previously.  She actually presents here today with complaints of rectal bleeding.  She is here with her daughter and an interpreter/translator since the patient since Spanish primarily.  She's had rectal bleeding on and off for the past 2 weeks or so.  Described as bright red blood on toilet paper only.  Some discomfort in rectum/anus especially with BM's as well.  Denies black/maroon stools.  No vomiting.  Some rght sided abdominal pain since TACE treatment.  Recent CBC in October showed normal Hgb at 12.3 grams.  Platelets were 71, INR 1.85, electrolytes and Cr WNL's.  LFT's relatively stable with total bili of 2.5.   Current Medications, Allergies, Past Medical History, Past Surgical History, Family History and Social History were reviewed in [employer] record.   Physical  Exam: BP 118/70 mmHg  Pulse 66  Ht 4\' 11"  (1.499 m)  Wt 190 lb (86.183 kg)  BMI 38.35 kg/m2 General: Well developed female in no acute distress Head: Normocephalic and atraumatic Eyes:  Sclerae anicteric, conjunctiva pink  Ears: Normal auditory acuity Lungs: Clear throughout to auscultation Heart: Regular rate and rhythm Rectal:  Small external hemorrhoids noted.  Anal fissure noted right posterior and was tender on exam.  No masses felt on DRE.  No stool or blood on exam glove.   Musculoskeletal: Symmetrical with no gross deformities  Neurological: Alert oriented x 4, grossly non-focal Psychological:  Alert and cooperative. Normal mood and affect  Assessment and Recommendations: -Rectal bleeding:  Anal fissure seen on exam today.  Will treat with diltiazem gel 2% TID applied externally and about and inch inside the anus for at least 6 weeks.  Discussed avoiding aggressive hygiene.   -Cirrhosis with [place] s/p TACE (due to [facility]):  Will continue to follow with oncology and hepatology/transplant at [facility] where she has been following for over a year.

## 2015-11-04 NOTE — Patient Instructions (Addendum)
We have sent the following medications to your pharmacy for you to pick up at your convenience: Diltiazem gel 2% use externally about an inch into anus x 6-8 weeks.  To [facility]

## 2015-11-04 NOTE — Progress Notes (Signed)
Reviewed. Agree. Treatment for anal fissure and resume care with tertiary care center for liver disease.

## 2015-11-15 ENCOUNTER — Encounter: Payer: Self-pay | Admitting: Clinical

## 2015-11-15 NOTE — Progress Notes (Signed)
[employer] says they will deliver fully electric bed for Heidi Booker, but that it is not fully funded through insurance, there will be a $50/month payment required; the semi-electric beds do not have adjustments for legs. Insurance will not pay for low air loss mattress, unless pt has a stage 3 or 4 pressure ulcer. Family says they will pay for the fully electric bed for their mother, Heidi Booker. If pt develops a stage 3 or 4 ulcer, another order will be sent for a low air loss mattress.

## 2015-11-27 DIAGNOSIS — K7460 Unspecified cirrhosis of liver: Secondary | ICD-10-CM | POA: Diagnosis not present

## 2015-11-27 DIAGNOSIS — K766 Portal hypertension: Secondary | ICD-10-CM | POA: Diagnosis not present

## 2015-11-27 DIAGNOSIS — K8020 Calculus of gallbladder without cholecystitis without obstruction: Secondary | ICD-10-CM | POA: Diagnosis not present

## 2015-11-27 DIAGNOSIS — C220 Liver cell carcinoma: Secondary | ICD-10-CM | POA: Diagnosis not present

## 2015-12-12 ENCOUNTER — Telehealth: Payer: Self-pay | Admitting: Clinical

## 2015-12-12 NOTE — Telephone Encounter (Signed)
Ms. Victory Dakin, daughter of Ms. Bartnik, wants [employer] to know that her mother has a CT scan scheduled for 12-17-14, and that she wants the number to call [employer], the company that delivered her mother's hospital bed, as she has not received a paper bill. Ms. Iline Oven is given [employer] number [phone].

## 2015-12-18 DIAGNOSIS — C220 Liver cell carcinoma: Secondary | ICD-10-CM | POA: Diagnosis not present

## 2016-02-12 DIAGNOSIS — Z8619 Personal history of other infectious and parasitic diseases: Secondary | ICD-10-CM | POA: Diagnosis not present

## 2016-02-12 DIAGNOSIS — I10 Essential (primary) hypertension: Secondary | ICD-10-CM | POA: Diagnosis not present

## 2016-02-12 DIAGNOSIS — Z87898 Personal history of other specified conditions: Secondary | ICD-10-CM | POA: Diagnosis not present

## 2016-02-12 DIAGNOSIS — C220 Liver cell carcinoma: Secondary | ICD-10-CM | POA: Diagnosis not present

## 2016-02-12 DIAGNOSIS — R600 Localized edema: Secondary | ICD-10-CM | POA: Diagnosis not present

## 2016-03-06 ENCOUNTER — Telehealth: Payer: Self-pay | Admitting: Clinical

## 2016-03-06 NOTE — Telephone Encounter (Signed)
Return call to pt concerning [employer] over-charging for hospital bed; [employer] has been contacted at [phone], they have updated insurance information for pt, and say that "we are so sorry, please tell Heidi Booker to disregard the bill". Pt's daughter, Heidi Booker, states awareness and understanding that if she has any future questions about the hospital bed billing, she can contact both her mothers insurance companies, as well as the medical supply company.

## 2016-03-23 DIAGNOSIS — M170 Bilateral primary osteoarthritis of knee: Secondary | ICD-10-CM | POA: Diagnosis not present

## 2016-03-23 DIAGNOSIS — M25562 Pain in left knee: Secondary | ICD-10-CM | POA: Diagnosis not present

## 2016-03-23 DIAGNOSIS — M25561 Pain in right knee: Secondary | ICD-10-CM | POA: Diagnosis not present

## 2016-03-30 DIAGNOSIS — M25562 Pain in left knee: Secondary | ICD-10-CM | POA: Diagnosis not present

## 2016-03-30 DIAGNOSIS — R2689 Other abnormalities of gait and mobility: Secondary | ICD-10-CM | POA: Diagnosis not present

## 2016-03-30 DIAGNOSIS — M170 Bilateral primary osteoarthritis of knee: Secondary | ICD-10-CM | POA: Diagnosis not present

## 2016-03-30 DIAGNOSIS — M25561 Pain in right knee: Secondary | ICD-10-CM | POA: Diagnosis not present

## 2016-04-06 DIAGNOSIS — R2689 Other abnormalities of gait and mobility: Secondary | ICD-10-CM | POA: Diagnosis not present

## 2016-04-06 DIAGNOSIS — M170 Bilateral primary osteoarthritis of knee: Secondary | ICD-10-CM | POA: Diagnosis not present

## 2016-04-06 DIAGNOSIS — M25561 Pain in right knee: Secondary | ICD-10-CM | POA: Diagnosis not present

## 2016-04-06 DIAGNOSIS — M25562 Pain in left knee: Secondary | ICD-10-CM | POA: Diagnosis not present

## 2016-04-09 ENCOUNTER — Encounter: Payer: Self-pay | Admitting: Clinical

## 2016-04-09 ENCOUNTER — Encounter: Payer: Self-pay | Admitting: Internal Medicine

## 2016-04-09 ENCOUNTER — Ambulatory Visit: Payer: Medicare Other | Attending: Internal Medicine | Admitting: Internal Medicine

## 2016-04-09 VITALS — BP 133/71 | HR 62 | Temp 97.7°F | Resp 18 | Ht 60.0 in

## 2016-04-09 DIAGNOSIS — I10 Essential (primary) hypertension: Secondary | ICD-10-CM

## 2016-04-09 DIAGNOSIS — F329 Major depressive disorder, single episode, unspecified: Secondary | ICD-10-CM | POA: Insufficient documentation

## 2016-04-09 DIAGNOSIS — K219 Gastro-esophageal reflux disease without esophagitis: Secondary | ICD-10-CM | POA: Diagnosis not present

## 2016-04-09 DIAGNOSIS — K7460 Unspecified cirrhosis of liver: Secondary | ICD-10-CM | POA: Insufficient documentation

## 2016-04-09 DIAGNOSIS — R32 Unspecified urinary incontinence: Secondary | ICD-10-CM | POA: Diagnosis not present

## 2016-04-09 DIAGNOSIS — Z79899 Other long term (current) drug therapy: Secondary | ICD-10-CM | POA: Insufficient documentation

## 2016-04-09 DIAGNOSIS — B182 Chronic viral hepatitis C: Secondary | ICD-10-CM | POA: Diagnosis not present

## 2016-04-09 DIAGNOSIS — R0602 Shortness of breath: Secondary | ICD-10-CM | POA: Diagnosis not present

## 2016-04-09 DIAGNOSIS — N3946 Mixed incontinence: Secondary | ICD-10-CM | POA: Diagnosis not present

## 2016-04-09 DIAGNOSIS — C220 Liver cell carcinoma: Secondary | ICD-10-CM | POA: Diagnosis not present

## 2016-04-09 DIAGNOSIS — M1990 Unspecified osteoarthritis, unspecified site: Secondary | ICD-10-CM | POA: Insufficient documentation

## 2016-04-09 DIAGNOSIS — R252 Cramp and spasm: Secondary | ICD-10-CM | POA: Diagnosis not present

## 2016-04-09 DIAGNOSIS — K7469 Other cirrhosis of liver: Secondary | ICD-10-CM

## 2016-04-09 MED ORDER — SPIRONOLACTONE 50 MG PO TABS: 50.0000 mg | ORAL_TABLET | Freq: Every day | ORAL | Status: DC

## 2016-04-09 MED ORDER — FUROSEMIDE 20 MG PO TABS: 20.0000 mg | ORAL_TABLET | Freq: Two times a day (BID) | ORAL | Status: DC

## 2016-04-09 MED ORDER — VITAMIN B-12 1000 MCG PO TABS: 1000.0000 ug | ORAL_TABLET | Freq: Every day | ORAL | Status: DC

## 2016-04-09 MED ORDER — VITAMIN D (ERGOCALCIFEROL) 1.25 MG (50000 UNIT) PO CAPS: 50000.0000 [IU] | ORAL_CAPSULE | ORAL | Status: DC

## 2016-04-09 MED ORDER — DEPEND UNDERWEAR X-LARGE MISC: Status: AC

## 2016-04-09 MED ORDER — PANTOPRAZOLE SODIUM 40 MG PO TBEC: 40.0000 mg | DELAYED_RELEASE_TABLET | Freq: Every day | ORAL | Status: DC

## 2016-04-09 NOTE — Progress Notes (Signed)
Patient is here for FU  Patient denies pain at this time.  Patient request a refill on Protonix.

## 2016-04-09 NOTE — Progress Notes (Signed)
Patient ID: Tennyson Kirsch, female   DOB: Apr 14, 1946, 70 y.o.   MRN: [id number]   Tylin Sagert, is a 70 y.o. female  [id number]  [id number]  DOB - [birth year]  Chief Complaint  Patient presents with  . Follow-up        Subjective:   Kitiara Mudrick is a 70 y.o. female with history of hypertension, chronic hepatitis C infection and liver cirrhosis, hepatocellular carcinoma status post TACE at [place] here today for a follow up visit and medication refill. She has no significant complaints today except for fatigue and the need for a prescription of DEPEND UNDERWEAR X-LARGE because she spends so much out of pocket to purchase this product without prescription. She has occasional cramps in her hands and lower extremity otherwise doing well. No generalized body itching, no yellowness of eyes, no hematemesis. Patient eats well, sleeps well. She needs a referral to a local oncologist because traveling to [facility] for lab work is becoming too expensive and time consuming. Patient has No headache, No chest pain, No abdominal pain - No Nausea, No new weakness tingling or numbness. She walks with a walker, she is requesting for a prescription for a walker with seat to help her when she is short of breath. She's currently on Lasix 20 mg twice daily and spironolactone 50 mg daily for her fluid retention. She also takes pantoprazole for 40 mg daily. She is not on any beta blocker, however, had been previously.  Problem  Cramp of Both Lower Extremities    ALLERGIES: No Known Allergies  PAST MEDICAL HISTORY: Past Medical History  Diagnosis Date  . Ulcer   . Hepatitis C   . Refusal of blood transfusions as patient is Jehovah's Witness   . Hypertension   . Hepatic cirrhosis ([place]) 01/21/2015    Lasix 20mg , nadolol to prevent varices from bleeding. From Hep C.  Xifaxin causesd severe abdominal pain. Was given to reduce confusion from cirrhosis.    . Hepatitis  C 01/21/2015    Under care Dr. Burnetta Sabin. Starting to treat hep c per patient.    . Depression 08/24/2014    Has a sister that is very sick and dealing with stress of this   . Nephrolithiasis 08/24/2014  . Arthritis 08/24/2014    Knees and ankles   . Cancer ([place])   . Liver cancer [facility])     MEDICATIONS AT HOME: Prior to Admission medications   Medication Sig Start Date End Date Taking? Authorizing Provider  diltiazem 2 % GEL Apply 1 application topically 3 (three) times daily. 11/04/15  Yes Jessica D Zehr, PA-C  furosemide (LASIX) 20 MG tablet Take 1 tablet (20 mg total) by mouth 2 (two) times daily. 04/09/16  Yes Tresa Garter, MD  Incontinence Supply Disposable (DEPEND UNDERWEAR X-LARGE) MISC Use as needed 04/09/16  Yes Tresa Garter, MD  Misc. Devices (ADJUSTABLE COMMODE 3-IN-1) MISC 1 each by Does not apply route as needed. 10/30/15  Yes Tresa Garter, MD  Misc. Devices (TRANSFER BENCH) MISC 1 each by Does not apply route as needed. 10/30/15  Yes Tresa Garter, MD  pantoprazole (PROTONIX) 40 MG tablet Take 1 tablet (40 mg total) by mouth daily. 04/09/16  Yes Tresa Garter, MD  spironolactone (ALDACTONE) 50 MG tablet Take 1 tablet (50 mg total) by mouth daily. 04/09/16  Yes Kirk Basquez Essie Christine, MD  traMADol (ULTRAM) 50 MG tablet Take 1 tablet (50 mg total) by mouth every 8 (  eight) hours as needed. 09/11/15  Yes Arnoldo Morale, MD  vitamin B-12 (CYANOCOBALAMIN) 1000 MCG tablet Take 1 tablet (1,000 mcg total) by mouth daily. 04/09/16   Tresa Garter, MD  Vitamin D, Ergocalciferol, (DRISDOL) 50000 units CAPS capsule Take 1 capsule (50,000 Units total) by mouth every 7 (seven) days. 04/09/16   Tresa Garter, MD     Objective:   Filed Vitals:   04/09/16 1616  BP: 133/71  Pulse: 62  Temp: 97.7 F (36.5 C)  TempSrc: Oral  Resp: 18  Height: 5' (1.524 m)  SpO2: 100%    Exam General appearance : Awake, alert, not in any distress. Speech Clear. Not toxic  looking HEENT: Atraumatic and Normocephalic, pupils equally reactive to light and accomodation Neck: supple, no JVD. No cervical lymphadenopathy.  Chest:Good air entry bilaterally, no added sounds  CVS: S1 S2 regular, no murmurs.  Abdomen: Bowel sounds present, Non tender and not distended with no gaurding, rigidity or rebound. Extremities: B/L Lower Ext shows no edema, both legs are warm to touch Neurology: Awake alert, and oriented X 3, CN II-XII intact, Non focal   Data Review No results found for: HGBA1C   Assessment & Plan   1. Other cirrhosis of liver (HCC)  - spironolactone (ALDACTONE) 50 MG tablet; Take 1 tablet (50 mg total) by mouth daily.  Dispense: 90 tablet; Refill: 3 - furosemide (LASIX) 20 MG tablet; Take 1 tablet (20 mg total) by mouth 2 (two) times daily.  Dispense: 180 tablet; Refill: 3 - vitamin B-12 (CYANOCOBALAMIN) 1000 MCG tablet; Take 1 tablet (1,000 mcg total) by mouth daily.  Dispense: 90 tablet; Refill: 3  2. Gastroesophageal reflux disease without esophagitis  - pantoprazole (PROTONIX) 40 MG tablet; Take 1 tablet (40 mg total) by mouth daily.  Dispense: 90 tablet; Refill: 3  3. Essential hypertension  We have discussed target BP range and blood pressure goal. I have advised patient to check BP regularly and to call us back or report to clinic if the numbers are consistently higher than 140/90. We discussed the importance of compliance with medical therapy and DASH diet recommended, consequences of uncontrolled hypertension discussed.   - continue current BP medications  4. Hepatocellular carcinoma ([place])  - For home use only DME 4 wheeled rolling walker with seat - Vitamin D, Ergocalciferol, (DRISDOL) 50000 units CAPS capsule; Take 1 capsule (50,000 Units total) by mouth every 7 (seven) days.  Dispense: 30 capsule; Refill: 0 - Ambulatory referral to Oncology  5. Cramp of both lower extremities  - COMPLETE METABOLIC PANEL WITH GFR  6. Mixed  incontinence  - Incontinence Supply Disposable (DEPEND UNDERWEAR X-LARGE) MISC; Use as needed  Dispense: 26 each; Refill: 11  Patient have been counseled extensively about nutrition and exercise  Return in about 4 weeks (around 05/07/2016) for Liver Cancer, Cramping.  The patient was given clear instructions to go to ER or return to medical center if symptoms don't improve, worsen or new problems develop. The patient verbalized understanding. The patient was told to call to get lab results if they haven't heard anything in the next week.   This note has been created with Surveyor, quantity. Any transcriptional errors are unintentional.    [name], MD, [place], [place], [place] and [place], [place]   04/09/2016, 5:06 PM

## 2016-04-09 NOTE — Patient Instructions (Signed)
Cncer de hgado  (Liver Cancer)  Las clulas se dividen para formar nuevas clulas cuando el cuerpo las necesita. Esto ocurre en el hgado, al igual que en otros rganos. A veces, las clulas se dividen demasiado rpido. Las clulas viejas no mueren, y el proceso escapa al control. Entonces se forma una masa (tumor). As es como comienza el cncer de hgado.  El hgado es un rgano importante. Se encuentra en la parte superior derecha del vientre (abdomen), justo por debajo de las costillas. Es el rgano ms grande del cuerpo. En un hombre adulto, es aproximadamente del tamao de una pelota de ftbol. El Pharmacist, hospital y hierro. Tambin limpia (filtra) las sustancias nocivas de la [place].  [place] cientficos no saben exactamente por qu las clulas comienzan a dividirse rpidamente en el hgado para formar un tumor. Se sabe que ciertas conductas y enfermedades (factores de riesgo) favorecen la aparicin de cncer de hgado. Ellas son:   Ser hombre. Los hombres Nordstrom de 50 aos tienen ms riesgo de sufrir cncer de hgado que [employer].  Tener cicatrices en el hgado (cirrosis).  Esto ocurre cuando las clulas del hgado se daan y son reemplazadas por tejido Pensions consultant.  El consumo excesivo de alcohol durante muchos aos puede causar cirrosis, as como estar infectado con hepatitis B o hepatitis C (VHB o VHC). Los VHB y [facility] se transmiten a travs de la sangre y por contacto sexual.  Sufrir ciertas enfermedades hepticas, tales como:  Hemocromatosis. En esta enfermedad se almacena demasiada cantidad de hierro en el hgado.  Hepatitis autoinmune. En esta enfermedad, el sistema inmunolgico ataca al hgado.  Enfermedad de [place]. Ocurre cuando se acumula cobre en el hgado.  La diabetes, especialmente si tambin bebe alcohol en exceso o sufre hepatitis viral crnica.  Exceso de peso (obesidad).  Exposicin a alfatoxinas. Son sustancias producidas por ciertos tipos de  moho. Se pueden formar en el man, maz, trigo, soja y arroz. Esto no es una causa frecuente de cncer de hgado en los [place] y [place], donde se controlan los alimentos para Research scientist (physical sciences). SNTOMAS  Muchas veces al principio el cncer de hgado tiene pocos sntomas. A veces, no hay ninguno. A medida que avanza, los sntomas pueden ser:   Prdida de peso sin hacer dieta.  Prdida del apetito.  Nuseas.  Sentirse muy dbil y cansado.  Dolor en el lado derecho del vientre (abdomen).  Sensacin de estar lleno o distensin.  Fiebre sin causa.  La piel o los ojos estn de color amarillo (ictericia).  Orina de color oscuro. DIAGNSTICO  El mdico puede sospechar cncer de hgado segn los sntomas y el examen fsico. Probablemente sean necesarias otras pruebas. Aqu se incluyen:   Anlisis de sangre, incluyendo una prueba llamada alfa-fetoprotena (AFP). La sangre contiene niveles ms altos de esta protena en el cncer de hgado.  Diagnstico por imgenes. Estas pruebas pueden detectar el cncer o un tumor. Aqu se incluyen tomografa computada, resonancia magntica y [place].  Biopsia de hgado. Usando un medicamento para Arts development officer, un especialista puede introducir una aguja en el hgado y tomar algunas clulas. Estas clulas se examinan al microscopio para determinar si hay cncer. Esta es la mejor manera de estar seguros del diagnstico. TRATAMIENTO  El cncer de hgado se puede tratar de [employer]. El tipo de tratamiento depender de:   El estadio en que se encuentre.  Su edad y [place] general de salud.  El [place] del hgado. Esto significa  cmo est funcionando y si hay cirrosis. Los tratamientos pueden realizarse solos o en combinacin. Las opciones incluyen:   [place].  Puede eliminarse la parte enferma del hgado.  Puede extirparse el hgado entero. Sera reemplazado por un hgado sano (transplante de hgado).  Radiacin. Rayos X de alta  energa que destruyen las clulas cancerosas.  Radiacin externa en forma de haces de rayos desde una mquina externa al cuerpo.  La radiacin interna utiliza pequeas esferas (como cuentas) que se colocan dentro del cuerpo. Estas esferas emiten radiacin.  Quimioterapia. Este tratamiento utiliza medicamentos que destruyen las clulas cancerosas. Las opciones incluyen:  Quimioterapia intravenosa. Se inyectan frmacos en la sangre para que se distribuyan travs del cuerpo.  Quimioterapia dirigida. Este utiliza un medicamento que trata el cncer de hgado impidiendo el suministro de sangre a las clulas cancerosas.  Quimioembolizacin. Los medicamentos se inyectan directamente en el hgado para eliminar clulas cancerosas.  La crioablacin. Se trata de matar las clulas cancerosas mediante el congelamiento.  Ablacin por radiofrecuencia. Este procedimiento elimina las clulas cancerosas calentndolos con una corriente elctrica. INSTRUCCIONES PARA EL CUIDADO EN EL HOGAR   Aprenda tanto como pueda sobre el cncer de hgado. Es importante ser un paciente informado.  Trabaje en estrecha colaboracin con sus mdicos. La lucha contra el cncer tiene un enfoque en equipo.  Tome los medicamentos para el dolor slo segn lo prescrito por su mdico. Siga cuidadosamente las indicaciones. Pregunte antes de tomar cualquier medicamento de [employer]. Tenga en cuenta que los medicamentos que contienen acetaminofeno pueden causar dao heptico.  Preste atencin a lo que come. La nutricin es una parte importante de la recuperacin de cncer de hgado. Este tipo de cncer puede hacer que tenga menos apetito. Tambin hay prdida de apetito por algunos de los tratamientos. Puede que tenga que limitar el consumo de sal. Tambin es importante consumir la cantidad [place] de protenas. Pdale consejo a su mdico o hable con un nutricionista.  Considere participar en un grupo de apoyo. Aprender a vivir con un  problema de salud grave, como cncer de hgado, puede ser difcil. Sus amigos y familiares pueden ayudarlo. Muchas personas tambin encuentran til conversar con otras personas que estn pasando por lo mismo. Pdale a su mdico una lista de grupos en su zona.  Descanse lo suficiente.  No consuma bebidas alcohlicas.  Vacnese contra la hepatitis A y hepatitis B. No existe una vacuna para la hepatitis C. Si se considera que tiene riesgo de contraer estas enfermedades, hgase anlisis. SOLICITE ATENCIN MDICA SI:   Usted no puede comer porque se siente mal del estmago.  El abdomen o las piernas comienzan a hincharse. Puede estar reteniendo lquidos.  Se siente ms dbil o ms cansado que de costumbre.  El dolor [place]. SOLICITE ATENCIN MDICA DE INMEDIATO SI:   Siente un dolor intenso en el abdomen que no se alivia.  Tiene nuseas, vmitos o diarrea que lo se calman.  Se siente confundido.  Se siente muy somnoliento Agricultural consultant.  Tiene una hemorragia que no se detiene rpidamente.  Tiene fiebre.   Esta informacin no tiene Marine scientist el consejo del mdico. Asegrese de hacerle al mdico cualquier pregunta que tenga.   Document Released: 08/12/2011 Document Revised: 02/22/2012 Elsevier Interactive Patient Education [employer].

## 2016-04-09 NOTE — Progress Notes (Signed)
  Depression screen [facility] 2/9 04/09/2016 04/09/2016 10/24/2015 09/11/2015 08/14/2015  Decreased Interest 0 0 1 0 0  Down, Depressed, Hopeless 1 0 1 0 0  PHQ - 2 Score 1 0 2 0 0  Altered sleeping 1 - 1 - 0  Tired, decreased energy 2 - 2 - 0  Change in appetite 1 - 1 - 0  Feeling bad or failure about yourself  0 - 0 - 0  Trouble concentrating 1 - 1 - 0  Moving slowly or fidgety/restless 0 - 0 - 0  Suicidal thoughts 0 - 0 - 0  PHQ-9 Score 6 - 7 - 0    GAD 7 : Generalized Anxiety Score 04/09/2016  Nervous, Anxious, on Edge 1  Control/stop worrying 0  Worry too much - different things 1  Trouble relaxing 1  Restless 0  Easily annoyed or irritable 1  Afraid - awful might happen 0  Total GAD 7 Score 4

## 2016-04-10 ENCOUNTER — Telehealth: Payer: Self-pay | Admitting: *Deleted

## 2016-04-10 ENCOUNTER — Encounter: Payer: Self-pay | Admitting: *Deleted

## 2016-04-10 LAB — COMPLETE METABOLIC PANEL WITH GFR
ALBUMIN: 2.5 g/dL — AB (ref 3.6–5.1)
ALK PHOS: 124 U/L (ref 33–130)
ALT: 54 U/L — ABNORMAL HIGH (ref 6–29)
AST: 83 U/L — ABNORMAL HIGH (ref 10–35)
BILIRUBIN TOTAL: 3.3 mg/dL — AB (ref 0.2–1.2)
BUN: 21 mg/dL (ref 7–25)
CO2: 24 mmol/L (ref 20–31)
Calcium: 8.3 mg/dL — ABNORMAL LOW (ref 8.6–10.4)
Chloride: 102 mmol/L (ref 98–110)
Creat: 0.92 mg/dL (ref 0.60–0.93)
GFR, EST AFRICAN AMERICAN: 72 mL/min (ref >=60)
GFR, EST NON AFRICAN AMERICAN: 62 mL/min (ref >=60)
GLUCOSE: 179 mg/dL — AB (ref 65–99)
POTASSIUM: 4.6 mmol/L (ref 3.5–5.3)
SODIUM: 135 mmol/L (ref 135–146)
Total Protein: 6.7 g/dL (ref 6.1–8.1)

## 2016-04-10 NOTE — Telephone Encounter (Signed)
Oncology Nurse Navigator Documentation  Oncology Nurse Navigator Flowsheets 04/10/2016  Navigator Location [facility]  Navigator Encounter Type Introductory phone call  Confirmed Diagnosis Date 04/05/2015  Treatment Initiated Date 06/04/2015  Acuity Level 2  Time Spent with Patient 74  Spoke with patient and daughter with assistance of [employer] ([place]) and  provided new patient appointment for 04/28/16 at 2:30 pm with Dr. Burr Medico. Informed of location of [place], valet service, and registration process. Reminded to bring insurance cards and a current medication list, including supplements. Patient & daughter verbalize understanding. She is transferring her care from [facility] to local oncologist-driving to [facility] is getting too difficult for her. Notified HIM to enter appointment into EPIC.

## 2016-04-13 DIAGNOSIS — M25562 Pain in left knee: Secondary | ICD-10-CM | POA: Diagnosis not present

## 2016-04-13 DIAGNOSIS — M170 Bilateral primary osteoarthritis of knee: Secondary | ICD-10-CM | POA: Diagnosis not present

## 2016-04-13 DIAGNOSIS — R2689 Other abnormalities of gait and mobility: Secondary | ICD-10-CM | POA: Diagnosis not present

## 2016-04-13 DIAGNOSIS — M25561 Pain in right knee: Secondary | ICD-10-CM | POA: Diagnosis not present

## 2016-04-20 DIAGNOSIS — M25562 Pain in left knee: Secondary | ICD-10-CM | POA: Diagnosis not present

## 2016-04-20 DIAGNOSIS — M25561 Pain in right knee: Secondary | ICD-10-CM | POA: Diagnosis not present

## 2016-04-20 DIAGNOSIS — M170 Bilateral primary osteoarthritis of knee: Secondary | ICD-10-CM | POA: Diagnosis not present

## 2016-04-20 DIAGNOSIS — R2689 Other abnormalities of gait and mobility: Secondary | ICD-10-CM | POA: Diagnosis not present

## 2016-04-22 ENCOUNTER — Telehealth: Payer: Self-pay | Admitting: *Deleted

## 2016-04-22 NOTE — Telephone Encounter (Signed)
-----   Message from Tresa Garter, MD sent at 04/13/2016 12:34 PM EDT ----- Please inform patient that her laboratory tests results are stable

## 2016-04-22 NOTE — Telephone Encounter (Signed)
Medical Assistant used [employer] to contact patient.  Interpreter Name: Berle Mull #: [id number]  Patient is aware of lab results being stable. No further questions at this time.

## 2016-04-27 ENCOUNTER — Encounter: Payer: Self-pay | Admitting: *Deleted

## 2016-04-28 ENCOUNTER — Telehealth: Payer: Self-pay | Admitting: Hematology

## 2016-04-28 ENCOUNTER — Ambulatory Visit (HOSPITAL_BASED_OUTPATIENT_CLINIC_OR_DEPARTMENT_OTHER): Payer: Medicare Other | Admitting: Hematology

## 2016-04-28 ENCOUNTER — Encounter: Payer: Self-pay | Admitting: Hematology

## 2016-04-28 VITALS — BP 143/49 | HR 69 | Temp 97.8°F | Resp 18 | Ht 60.0 in

## 2016-04-28 DIAGNOSIS — C220 Liver cell carcinoma: Secondary | ICD-10-CM | POA: Diagnosis not present

## 2016-04-28 DIAGNOSIS — K7460 Unspecified cirrhosis of liver: Secondary | ICD-10-CM

## 2016-04-28 DIAGNOSIS — B182 Chronic viral hepatitis C: Secondary | ICD-10-CM

## 2016-04-28 DIAGNOSIS — K7469 Other cirrhosis of liver: Secondary | ICD-10-CM

## 2016-04-28 NOTE — Telephone Encounter (Signed)
per pfo to sch pt appt-gave pt copy of avs °

## 2016-04-28 NOTE — Progress Notes (Signed)
[place]  [phone] Fax:([phone] [place] Note   Patient Care Team: Tresa Garter, MD as PCP - General (Internal Medicine) No Pcp Per Patient (General Practice) 04/28/2016  REFERRAL PHYSICIAN: [name], MD  CHIEF COMPLAINTS/PURPOSE OF CONSULTATION:  Heidi Booker  Oncology History   Hepatocellular carcinoma ([place])   Staging form: Liver (Excluding Intrahepatic Bile Ducts), AJCC 7th Edition     Clinical stage from 10/22/2014: Stage II (T2, N0, M0) - Signed by Truitt Merle, MD on 04/30/2016        Hepatocellular carcinoma ([place])   10/22/2014 Imaging MRI ABD: Increased size of 1.6 cm hypervascular mass in lateral aspect of segment 8 and 1.2 cm hypervascular mass in dome of segment 8 consistent with [facility]   11/13/2014 Tumor Marker AFP=11.8   07/04/2015 Procedure TACE Right hepatic lobe [facility])   07/05/2015 Initial Diagnosis Hepatocellular carcinoma ([place])   08/09/2015 Imaging PET: 3.5 cm right hepatic lobe hypodense lesion w/central photopenia; cirrhosis and portal venous hypertension   08/29/2015 Imaging MRI ABD: Persistent, but mildly decreased enhancement in hepatic segment #8 following TACE, suggestive of viable disease.Appears increased in size & could be due to developement of interval necrosis secondary to treatment; T12 osseous lesion   08/29/2015 Tumor Marker AFP=12.7   11/27/2015 Imaging MRI ABD: Persistent early enhancing lesions hepatic segment #8 w/washout following TACE;  A 3.4 cm segment 8 lesion has slightly increased; previously seen enhancing osseous lesion in T12 verterbral body not well visualized on current study.    HISTORY OF PRESENTING ILLNESS:  Heidi Booker 70 y.o. female with history of Hep C, liver cirrhosis and HCC, presents to our cancer center to transfer her oncological care to [place]. She is accompanied by her daughter and daughter-in-law to my clinic today.  She was diagnosed with hep C and liver cirrhosis about two  years ago in [place], but was not treated. She moved to [facility] with her daughters in mid 2015, routine ultrasound of abdomen on 04/13/2014 showed 2 subcentimeter lesions in the liver. She underwent abdominal MRI on 05/12/2014, which showed a 1.2 cm and 0.8 cm lesion in segment 8, concerning for hepatocellular carcinoma. On the follow-up MRI of the abdomen on 10/22/2014, both lesions increased in size to 1.6 and 1.2 cm, and the imaging characteristic will consistent with [place]. AFP was elevated at 11.8. She was referred to [facility] for treatment. She was deemed to be not a candidate for liver surgery or liver transplant, and he received TACE in July 2016. She has been followed by Dr. Aleatha Borer at [facility], due to the stable disease, and the compromised liver function, she has not received any additional treatment since then. Sorafenib was discussed, but not offered. Hepatitis C treatment was also discussed and offered to, but she did not start since she never received the antiviral medication.  She lives with her daughter, able to take care of herself and tolerates light activities. She has moderate fatigue, abdominal bloating, no significant pain, or nausea. She has loose bowel movement 3-4 times a day, no hematochezia or melena.   MEDICAL HISTORY:  Past Medical History  Diagnosis Date  . Ulcer   . Hepatitis C   . Refusal of blood transfusions as patient is Jehovah's Witness   . Hypertension   . Hepatic cirrhosis ([place]) 01/21/2015    Lasix 20mg , nadolol to prevent varices from bleeding. From Hep C.  Xifaxin causesd severe abdominal pain. Was given to reduce confusion from cirrhosis.    Marland Kitchen  Hepatitis C 01/21/2015    Under care Dr. Burnetta Sabin. Starting to treat hep c per patient.    . Depression 08/24/2014    Has a sister that is very sick and dealing with stress of this   . Nephrolithiasis 08/24/2014  . Arthritis 08/24/2014    Knees and ankles   . Cancer ([place])   . Liver cancer ([place]) 07/05/2015     SURGICAL HISTORY: Past Surgical History  Procedure Laterality Date  . Partial hysterectomy    . Knee arthroscopy Left   . Esophagogastroduodenoscopy N/A 05/09/2014    Procedure: ESOPHAGOGASTRODUODENOSCOPY (EGD);  Surgeon: Irene Shipper, MD;  Location[facility] ENDOSCOPY;  Service: Endoscopy;  Laterality: N/A;  . Abdominal hysterectomy    . Knee surgery      "removed something"  . Tonsilectomy/adenoidectomy with myringotomy      SOCIAL HISTORY: Social History   Social History  . Marital Status: Widowed    Spouse Name: N/A  . Number of Children: 1  . Years of Education: N/A   Occupational History  . Not on file.   Social History Main Topics  . Smoking status: Never Smoker   . Smokeless tobacco: Never Used  . Alcohol Use: No     Comment: quit 24 years ago  . Drug Use: No  . Sexual Activity: No   Other Topics Concern  . Not on file   Social History Narrative   ** Merged History Encounter **   Widowed   Lives with son, 3 daughters. 4 children (lost 3 boys in childbirth). 3 grandkids.    Originally from [place]   Has sister who is very ill      Retired, stays at home.       Hobbies: stays at home    FAMILY HISTORY: Family History  Problem Relation Age of Onset  . Pulmonary fibrosis Sister   . Kidney disease Sister   . Colon cancer Neg Hx     ALLERGIES:  is allergic to other.  MEDICATIONS:  Current Outpatient Prescriptions  Medication Sig Dispense Refill  . furosemide (LASIX) 20 MG tablet Take 1 tablet (20 mg total) by mouth 2 (two) times daily. 180 tablet 3  . Incontinence Supply Disposable (DEPEND UNDERWEAR X-LARGE) MISC Use as needed 26 each 11  . Misc. Devices (ADJUSTABLE COMMODE 3-IN-1) MISC 1 each by Does not apply route as needed. 1 each 0  . Misc. Devices (TRANSFER BENCH) MISC 1 each by Does not apply route as needed. 1 each 0  . pantoprazole (PROTONIX) 40 MG tablet Take 1 tablet (40 mg total) by mouth daily. (Patient taking differently: Take 40 mg  by mouth 2 (two) times daily. ) 90 tablet 3  . spironolactone (ALDACTONE) 50 MG tablet Take 1 tablet (50 mg total) by mouth daily. 90 tablet 3  . traMADol (ULTRAM) 50 MG tablet Take 1 tablet (50 mg total) by mouth every 8 (eight) hours as needed. 30 tablet 0  . vitamin B-12 (CYANOCOBALAMIN) 1000 MCG tablet Take 1 tablet (1,000 mcg total) by mouth daily. 90 tablet 3  . Vitamin D, Ergocalciferol, (DRISDOL) 50000 units CAPS capsule Take 1 capsule (50,000 Units total) by mouth every 7 (seven) days. 30 capsule 0   No current facility-administered medications for this visit.    REVIEW OF SYSTEMS:   Constitutional: Denies fevers, chills or abnormal night sweats Eyes: Denies blurriness of vision, double vision or watery eyes Ears, nose, mouth, throat, and face: Denies mucositis or sore throat Respiratory:  Denies cough, dyspnea or wheezes Cardiovascular: Denies palpitation, chest discomfort or lower extremity swelling Gastrointestinal:  Denies nausea, heartburn or change in bowel habits Skin: Denies abnormal skin rashes Lymphatics: Denies new lymphadenopathy or easy bruising Neurological:Denies numbness, tingling or new weaknesses Behavioral/Psych: Mood is stable, no new changes  All other systems were reviewed with the patient and are negative.  PHYSICAL EXAMINATION: ECOG PERFORMANCE STATUS: 2 - Symptomatic, <50% confined to bed  Filed Vitals:   04/28/16 1500  BP: 143/49  Pulse: 69  Temp: 97.8 F (36.6 C)  Resp: 18   There were no vitals filed for this visit.  GENERAL:alert, no distress and comfortable SKIN: skin color, texture, turgor are normal, no rashes or significant lesions EYES: normal, conjunctiva are pink and non-injected, sclera clear OROPHARYNX:no exudate, no erythema and lips, buccal mucosa, and tongue normal  NECK: supple, thyroid normal size, non-tender, without nodularity LYMPH:  no palpable lymphadenopathy in the cervical, axillary or inguinal LUNGS: clear to  auscultation and percussion with normal breathing effort HEART: regular rate & rhythm and no murmurs and no lower extremity edema ABDOMEN:abdomen soft, non-tender and normal bowel sounds Musculoskeletal:no cyanosis of digits and no clubbing  PSYCH: alert & oriented x 3 with fluent speech NEURO: no focal motor/sensory deficits  LABORATORY DATA:  I have reviewed the data as listed  CBC Latest Ref Rng 10/09/2015 07/12/2015 03/07/2015  WBC 4.0 - 10.5 K/uL 7.2 10.2 7.1  Hemoglobin 12.0 - 15.0 g/dL 12.3 12.6 12.9  Hematocrit 36.0 - 46.0 % 36.6 38.5 38.8  Platelets 150 - 400 K/uL 71(L) 78.0 Repeated and verified X2.(L) 60.0(L)    CMP Latest Ref Rng 04/09/2016 10/09/2015 07/12/2015  Glucose 65 - 99 mg/dL 179(H) 177(H) 82  BUN 7 - 25 mg/dL 21 12 15   Creatinine 0.60 - 0.93 mg/dL 0.92 0.80 0.89  Sodium 135 - 146 mmol/L 135 134(L) 135  Potassium 3.5 - 5.3 mmol/L 4.6 4.1 5.2(H)  Chloride 98 - 110 mmol/L 102 107 102  CO2 20 - 31 mmol/L 24 24 29   Calcium 8.6 - 10.4 mg/dL 8.3(L) 8.0(L) 8.6  Total Protein 6.1 - 8.1 g/dL 6.7 5.8(L) 7.6  Total Bilirubin 0.2 - 1.2 mg/dL 3.3(H) 2.5(H) 2.7(H)  Alkaline Phos 33 - 130 U/L 124 127(H) 146(H)  AST 10 - 35 U/L 83(H) 133(H) 77(H)  ALT 6 - 29 U/L 54(H) 144(H) 72(H)    RADIOGRAPHIC STUDIES: I have personally reviewed the radiological images as listed and agreed with the findings in the report.  Abdominal MRI w wo contrast 11/27/2015 IMPRESSION: -- Persistent early enhancing lesions in hepatic segment VIII with washout following TACE 07/04/15. 3.4 cm segment VIII lesion has slightly increased; otherwise, lesions are unchanged. -- Previously seen enhancing osseous lesion in the T12 vertebral body is not well visualized on the current study. -- Cirrhosis and portal hypertension.  -- Cholelithiasis without evidence of cholecystitis. Mild gallbladder wall edema is likely due to chronic liver disease, similar to prior.  ASSESSMENT & PLAN: 70 year old Spanish  speaking female, with untreated hepatitis C, liver cirrhosis and hepatocellular carcinoma.  1. Hepatocellular carcinoma, multifocal (2) in segment 8, cT2N0M0, s/p TACE 06/2015 -I reviewed and discussed all her previous images with patient and her daughters. -Based on the imaging findings, her significant liver disease and liver cirrhosis, I agree with the diagnosis of [place]. -She was evaluated at [facility], and felt not to be a candidate for liver resection or transplant. She has received TACE once in July 2016. -Due to her liver cirrhosis  and decompensated liver function (child Pugh B), she is not a good candidate for further liver targeted therapy and sorafenib. -Her hepatitis C has not been treated, her liver function may improve if she started on hepatitis C enterovirus treatment. -If her bilirubin improves, she may be a candidate for further liver target therapy. I also discussed systemic therapy options, including sorafenib, immunotherapy such as Nivolumab, and other VEGFR inhibitors. -I recommend her to repeat a abdominal MRI in a month to evaluate the disease status. -I'll repeat her lab including CBC, CMP, PT INR and AFP.  2. Hep C, liver cirrhosis, Child-Pugh score 9 (B) -I'll refer her to infection disease specialist to consider hepatitis C treatment. Hopefully her liver function would improve, so she has more treatment options for his [place]. -I suggest her to follow-up with gastroenterologist Dr. Henrene Pastor for her liver cirrhosis management   Plan -lab in the next week -ID referral, and I strongly encouraged her to call Dr. Blanch Media office for follow-up -Return to clinic in 1 month with a repeated abdominal MRI.  All questions were answered. The patient knows to call the clinic with any problems, questions or concerns. I spent 55 minutes counseling the patient face to face. The total time spent in the appointment was 60 minutes and more than 50% was on counseling.     Truitt Merle, MD 04/28/2016  3:36 PM

## 2016-04-29 ENCOUNTER — Telehealth: Payer: Self-pay | Admitting: Internal Medicine

## 2016-04-29 NOTE — Telephone Encounter (Signed)
Pt. Daughter called requesting to know on the status of the hospital bed for the pt. Pt. Had spoken with the social worker about the request. Please f/u with pt.

## 2016-04-30 ENCOUNTER — Ambulatory Visit: Payer: Medicare Other | Admitting: Internal Medicine

## 2016-04-30 ENCOUNTER — Encounter: Payer: Self-pay | Admitting: Hematology

## 2016-05-04 ENCOUNTER — Encounter: Payer: Self-pay | Admitting: *Deleted

## 2016-05-04 NOTE — Progress Notes (Signed)
Oncology Nurse Navigator Documentation  Oncology Nurse Navigator Flowsheets 05/04/2016  Navigator Location [facility]  Navigator Encounter Type Letter/Fax/Email  Confirmed Diagnosis Date -  Treatment Initiated Date -  Barriers/Navigation Needs Coordination of Care--referral to [facility] per Dr. Burr Medico  Interventions Coordination of Care--faxed records to referral center and notified radiology to send recent PET on CD to [place] of Care Appts  Acuity Level 2  Time Spent with Patient 30

## 2016-05-05 ENCOUNTER — Other Ambulatory Visit (HOSPITAL_BASED_OUTPATIENT_CLINIC_OR_DEPARTMENT_OTHER): Payer: Medicare Other

## 2016-05-05 DIAGNOSIS — C220 Liver cell carcinoma: Secondary | ICD-10-CM

## 2016-05-05 LAB — PROTIME-INR
INR: 1.4 — ABNORMAL LOW (ref 2.00–3.50)
PROTIME: 16.8 s — AB (ref 10.6–13.4)

## 2016-05-05 LAB — COMPREHENSIVE METABOLIC PANEL
ALT: 91 U/L — AB (ref 0–55)
AST: 156 U/L — AB (ref 5–34)
Albumin: 2.4 g/dL — ABNORMAL LOW (ref 3.5–5.0)
Alkaline Phosphatase: 164 U/L — ABNORMAL HIGH (ref 40–150)
Anion Gap: 7 mEq/L (ref 3–11)
BUN: 16.6 mg/dL (ref 7.0–26.0)
CALCIUM: 8.6 mg/dL (ref 8.4–10.4)
CHLORIDE: 107 meq/L (ref 98–109)
CO2: 24 mEq/L (ref 22–29)
CREATININE: 0.9 mg/dL (ref 0.6–1.1)
EGFR: 65 mL/min/{1.73_m2} — ABNORMAL LOW (ref >90)
Glucose: 243 mg/dl — ABNORMAL HIGH (ref 70–140)
Potassium: 3.8 mEq/L (ref 3.5–5.1)
Sodium: 138 mEq/L (ref 136–145)
Total Bilirubin: 3.4 mg/dL — ABNORMAL HIGH (ref 0.20–1.20)
Total Protein: 6.5 g/dL (ref 6.4–8.3)

## 2016-05-05 LAB — CBC WITH DIFFERENTIAL/PLATELET
BASO%: 0.3 % (ref 0.0–2.0)
Basophils Absolute: 0 10*3/uL (ref 0.0–0.1)
EOS%: 1.6 % (ref 0.0–7.0)
Eosinophils Absolute: 0.1 10*3/uL (ref 0.0–0.5)
HEMATOCRIT: 39.7 % (ref 34.8–46.6)
HEMOGLOBIN: 13.4 g/dL (ref 11.6–15.9)
LYMPH#: 1.9 10*3/uL (ref 0.9–3.3)
LYMPH%: 32.9 % (ref 14.0–49.7)
MCH: 30.2 pg (ref 25.1–34.0)
MCHC: 33.8 g/dL (ref 31.5–36.0)
MCV: 89.6 fL (ref 79.5–101.0)
MONO#: 0.9 10*3/uL (ref 0.1–0.9)
MONO%: 15.2 % — ABNORMAL HIGH (ref 0.0–14.0)
NEUT%: 50 % (ref 38.4–76.8)
NEUTROS ABS: 2.9 10*3/uL (ref 1.5–6.5)
NRBC: 0 % (ref 0–0)
Platelets: 52 10*3/uL — ABNORMAL LOW (ref 145–400)
RBC: 4.43 10*6/uL (ref 3.70–5.45)
RDW: 18.5 % — AB (ref 11.2–14.5)
WBC: 5.7 10*3/uL (ref 3.9–10.3)

## 2016-05-06 LAB — AFP TUMOR MARKER: AFP, Serum, Tumor Marker: 28.1 ng/mL — ABNORMAL HIGH (ref 0.0–8.3)

## 2016-05-13 ENCOUNTER — Telehealth: Payer: Self-pay | Admitting: *Deleted

## 2016-05-13 NOTE — Telephone Encounter (Signed)
Oncology Nurse Navigator Documentation  Oncology Nurse Navigator Flowsheets 05/13/2016  Navigator Location [facility]  Navigator Encounter Type Telephone  Telephone Outgoing Call;Appt Confirmation/Clarification  Confirmed Diagnosis Date -  Treatment Initiated Date -  Barriers/Navigation Needs -  Interventions -Via use of interpreter [phone] notified daughter of the appointment on 6/6 with Dr. Zollie Scale at [facility]. She states she was aware. They received a packet in the mail and will be there.  Coordination of Care -  Acuity -  Time Spent with Patient 15

## 2016-05-14 ENCOUNTER — Ambulatory Visit: Payer: Medicare Other | Attending: Internal Medicine | Admitting: Internal Medicine

## 2016-05-14 ENCOUNTER — Encounter: Payer: Self-pay | Admitting: Internal Medicine

## 2016-05-14 VITALS — BP 132/77 | HR 78 | Temp 98.0°F | Resp 18 | Ht 59.0 in | Wt 162.4 lb

## 2016-05-14 DIAGNOSIS — B1920 Unspecified viral hepatitis C without hepatic coma: Secondary | ICD-10-CM | POA: Insufficient documentation

## 2016-05-14 DIAGNOSIS — M1990 Unspecified osteoarthritis, unspecified site: Secondary | ICD-10-CM | POA: Diagnosis not present

## 2016-05-14 DIAGNOSIS — L299 Pruritus, unspecified: Secondary | ICD-10-CM | POA: Insufficient documentation

## 2016-05-14 DIAGNOSIS — I10 Essential (primary) hypertension: Secondary | ICD-10-CM | POA: Insufficient documentation

## 2016-05-14 DIAGNOSIS — C220 Liver cell carcinoma: Secondary | ICD-10-CM | POA: Diagnosis not present

## 2016-05-14 DIAGNOSIS — F329 Major depressive disorder, single episode, unspecified: Secondary | ICD-10-CM | POA: Insufficient documentation

## 2016-05-14 DIAGNOSIS — Z79899 Other long term (current) drug therapy: Secondary | ICD-10-CM | POA: Insufficient documentation

## 2016-05-14 MED ORDER — CHOLESTYRAMINE 4 G PO PACK: 4.0000 g | PACK | Freq: Three times a day (TID) | ORAL | Status: DC

## 2016-05-14 MED ORDER — DICLOFENAC SODIUM 75 MG PO TBEC: 75.0000 mg | DELAYED_RELEASE_TABLET | Freq: Two times a day (BID) | ORAL | Status: DC

## 2016-05-14 NOTE — Patient Instructions (Signed)
Cncer de hgado  (Liver Cancer)  Las clulas se dividen para formar nuevas clulas cuando el cuerpo las necesita. Esto ocurre en el hgado, al igual que en otros rganos. A veces, las clulas se dividen demasiado rpido. Las clulas viejas no mueren, y el proceso escapa al control. Entonces se forma una masa (tumor). As es como comienza el cncer de hgado.  El hgado es un rgano importante. Se encuentra en la parte superior derecha del vientre (abdomen), justo por debajo de las costillas. Es el rgano ms grande del cuerpo. En un hombre adulto, es aproximadamente del tamao de una pelota de ftbol. El Pharmacist, hospital y hierro. Tambin limpia (filtra) las sustancias nocivas de la [place].  [place] cientficos no saben exactamente por qu las clulas comienzan a dividirse rpidamente en el hgado para formar un tumor. Se sabe que ciertas conductas y enfermedades (factores de riesgo) favorecen la aparicin de cncer de hgado. Ellas son:   Ser hombre. Los hombres Nordstrom de 50 aos tienen ms riesgo de sufrir cncer de hgado que [employer].  Tener cicatrices en el hgado (cirrosis).  Esto ocurre cuando las clulas del hgado se daan y son reemplazadas por tejido Pensions consultant.  El consumo excesivo de alcohol durante muchos aos puede causar cirrosis, as como estar infectado con hepatitis B o hepatitis C (VHB o VHC). Los VHB y [facility] se transmiten a travs de la sangre y por contacto sexual.  Sufrir ciertas enfermedades hepticas, tales como:  Hemocromatosis. En esta enfermedad se almacena demasiada cantidad de hierro en el hgado.  Hepatitis autoinmune. En esta enfermedad, el sistema inmunolgico ataca al hgado.  Enfermedad de [place]. Ocurre cuando se acumula cobre en el hgado.  La diabetes, especialmente si tambin bebe alcohol en exceso o sufre hepatitis viral crnica.  Exceso de peso (obesidad).  Exposicin a alfatoxinas. Son sustancias producidas por ciertos tipos de  moho. Se pueden formar en el man, maz, trigo, soja y arroz. Esto no es una causa frecuente de cncer de hgado en los [place] y [place], donde se controlan los alimentos para Research scientist (physical sciences). SNTOMAS  Muchas veces al principio el cncer de hgado tiene pocos sntomas. A veces, no hay ninguno. A medida que avanza, los sntomas pueden ser:   Prdida de peso sin hacer dieta.  Prdida del apetito.  Nuseas.  Sentirse muy dbil y cansado.  Dolor en el lado derecho del vientre (abdomen).  Sensacin de estar lleno o distensin.  Fiebre sin causa.  La piel o los ojos estn de color amarillo (ictericia).  Orina de color oscuro. DIAGNSTICO  El mdico puede sospechar cncer de hgado segn los sntomas y el examen fsico. Probablemente sean necesarias otras pruebas. Aqu se incluyen:   Anlisis de sangre, incluyendo una prueba llamada alfa-fetoprotena (AFP). La sangre contiene niveles ms altos de esta protena en el cncer de hgado.  Diagnstico por imgenes. Estas pruebas pueden detectar el cncer o un tumor. Aqu se incluyen tomografa computada, resonancia magntica y [place].  Biopsia de hgado. Usando un medicamento para Arts development officer, un especialista puede introducir una aguja en el hgado y tomar algunas clulas. Estas clulas se examinan al microscopio para determinar si hay cncer. Esta es la mejor manera de estar seguros del diagnstico. TRATAMIENTO  El cncer de hgado se puede tratar de [employer]. El tipo de tratamiento depender de:   El estadio en que se encuentre.  Su edad y [place] general de salud.  El [place] del hgado. Esto significa  cmo est funcionando y si hay cirrosis. Los tratamientos pueden realizarse solos o en combinacin. Las opciones incluyen:   [place].  Puede eliminarse la parte enferma del hgado.  Puede extirparse el hgado entero. Sera reemplazado por un hgado sano (transplante de hgado).  Radiacin. Rayos X de alta  energa que destruyen las clulas cancerosas.  Radiacin externa en forma de haces de rayos desde una mquina externa al cuerpo.  La radiacin interna utiliza pequeas esferas (como cuentas) que se colocan dentro del cuerpo. Estas esferas emiten radiacin.  Quimioterapia. Este tratamiento utiliza medicamentos que destruyen las clulas cancerosas. Las opciones incluyen:  Quimioterapia intravenosa. Se inyectan frmacos en la sangre para que se distribuyan travs del cuerpo.  Quimioterapia dirigida. Este utiliza un medicamento que trata el cncer de hgado impidiendo el suministro de sangre a las clulas cancerosas.  Quimioembolizacin. Los medicamentos se inyectan directamente en el hgado para eliminar clulas cancerosas.  La crioablacin. Se trata de matar las clulas cancerosas mediante el congelamiento.  Ablacin por radiofrecuencia. Este procedimiento elimina las clulas cancerosas calentndolos con una corriente elctrica. INSTRUCCIONES PARA EL CUIDADO EN EL HOGAR   Aprenda tanto como pueda sobre el cncer de hgado. Es importante ser un paciente informado.  Trabaje en estrecha colaboracin con sus mdicos. La lucha contra el cncer tiene un enfoque en equipo.  Tome los medicamentos para el dolor slo segn lo prescrito por su mdico. Siga cuidadosamente las indicaciones. Pregunte antes de tomar cualquier medicamento de [employer]. Tenga en cuenta que los medicamentos que contienen acetaminofeno pueden causar dao heptico.  Preste atencin a lo que come. La nutricin es una parte importante de la recuperacin de cncer de hgado. Este tipo de cncer puede hacer que tenga menos apetito. Tambin hay prdida de apetito por algunos de los tratamientos. Puede que tenga que limitar el consumo de sal. Tambin es importante consumir la cantidad [place] de protenas. Pdale consejo a su mdico o hable con un nutricionista.  Considere participar en un grupo de apoyo. Aprender a vivir con un  problema de salud grave, como cncer de hgado, puede ser difcil. Sus amigos y familiares pueden ayudarlo. Muchas personas tambin encuentran til conversar con otras personas que estn pasando por lo mismo. Pdale a su mdico una lista de grupos en su zona.  Descanse lo suficiente.  No consuma bebidas alcohlicas.  Vacnese contra la hepatitis A y hepatitis B. No existe una vacuna para la hepatitis C. Si se considera que tiene riesgo de contraer estas enfermedades, hgase anlisis. SOLICITE ATENCIN MDICA SI:   Usted no puede comer porque se siente mal del estmago.  El abdomen o las piernas comienzan a hincharse. Puede estar reteniendo lquidos.  Se siente ms dbil o ms cansado que de costumbre.  El dolor [place]. SOLICITE ATENCIN MDICA DE INMEDIATO SI:   Siente un dolor intenso en el abdomen que no se alivia.  Tiene nuseas, vmitos o diarrea que lo se calman.  Se siente confundido.  Se siente muy somnoliento Agricultural consultant.  Tiene una hemorragia que no se detiene rpidamente.  Tiene fiebre.   Esta informacin no tiene Marine scientist el consejo del mdico. Asegrese de hacerle al mdico cualquier pregunta que tenga.   Document Released: 08/12/2011 Document Revised: 02/22/2012 Elsevier Interactive Patient Education [employer].

## 2016-05-14 NOTE — Progress Notes (Signed)
Patient is here for FU Liver Cancer  Patient denies pain at this time.  Patient only complains of chronic knee pain

## 2016-05-15 ENCOUNTER — Telehealth: Payer: Self-pay | Admitting: Internal Medicine

## 2016-05-15 NOTE — Telephone Encounter (Signed)
Patient is calling to follow up on bed request...patient states that jamie had everything set up....  No one has followed up with her....  Heidi Booker is from [facility] needs to be contacted to arrange bed.....[phone] ext. [phone]   Please follow up with patient

## 2016-05-17 NOTE — Progress Notes (Signed)
Patient ID: Heidi Booker, female   DOB: 07-26-1946, 70 y.o.   MRN: [id number]   Heidi Booker, is a 70 y.o. female  [id number]  [id number]  DOB - 1946-11-03  Chief Complaint  Patient presents with  . Follow-up    liver cancer        Subjective:   Heidi Booker is a 70 y.o. female with history of Hep C, liver cirrhosis and HCC accompanied by her daughter  here today for a follow up visit. Patient is s/p TACE at [facility] but recently transferred her cancer care to Dr. Burr Medico of the Oncology unit at [facility]. Her major complaint today is knee pain and itching. She is having generalized body itching for some weeks now. She also has bilateral knee pains but no swelling.No redness. No fall or trauma. She continues to have significant fatigue. Patient has No headache, No chest pain, No abdominal pain - No Nausea, No new weakness tingling or numbness, No Cough - SOB.  No problems updated.  ALLERGIES: Allergies  Allergen Reactions  . Other Swelling    octopus    PAST MEDICAL HISTORY: Past Medical History  Diagnosis Date  . Ulcer   . Hepatitis C   . Refusal of blood transfusions as patient is Jehovah's Witness   . Hypertension   . Hepatic cirrhosis ([place]) 01/21/2015    Lasix 20mg , nadolol to prevent varices from bleeding. From Hep C.  Xifaxin causesd severe abdominal pain. Was given to reduce confusion from cirrhosis.    . Hepatitis C 01/21/2015    Under care Dr. Burnetta Sabin. Starting to treat hep c per patient.    . Depression 08/24/2014    Has a sister that is very sick and dealing with stress of this   . Nephrolithiasis 08/24/2014  . Arthritis 08/24/2014    Knees and ankles   . Cancer ([place])   . Liver cancer ([place]) 07/05/2015    MEDICATIONS AT HOME: Prior to Admission medications   Medication Sig Start Date End Date Taking? Authorizing Provider  furosemide (LASIX) 20 MG tablet Take 1 tablet (20 mg total) by mouth 2 (two) times daily. 04/09/16  Yes Tresa Garter, MD  Incontinence Supply Disposable (DEPEND UNDERWEAR X-LARGE) MISC Use as needed 04/09/16  Yes Tresa Garter, MD  Misc. Devices (ADJUSTABLE COMMODE 3-IN-1) MISC 1 each by Does not apply route as needed. 10/30/15  Yes Tresa Garter, MD  Misc. Devices (TRANSFER BENCH) MISC 1 each by Does not apply route as needed. 10/30/15  Yes Tresa Garter, MD  pantoprazole (PROTONIX) 40 MG tablet Take 1 tablet (40 mg total) by mouth daily. Patient taking differently: Take 40 mg by mouth 2 (two) times daily.  04/09/16  Yes Tresa Garter, MD  spironolactone (ALDACTONE) 50 MG tablet Take 1 tablet (50 mg total) by mouth daily. 04/09/16  Yes Payton Moder Essie Christine, MD  traMADol (ULTRAM) 50 MG tablet Take 1 tablet (50 mg total) by mouth every 8 (eight) hours as needed. 09/11/15  Yes Arnoldo Morale, MD  vitamin B-12 (CYANOCOBALAMIN) 1000 MCG tablet Take 1 tablet (1,000 mcg total) by mouth daily. 04/09/16  Yes Tresa Garter, MD  Vitamin D, Ergocalciferol, (DRISDOL) 50000 units CAPS capsule Take 1 capsule (50,000 Units total) by mouth every 7 (seven) days. 04/09/16  Yes Tresa Garter, MD  cholestyramine (QUESTRAN) 4 g packet Take 1 packet (4 g total) by mouth 3 (three) times daily with meals. 05/14/16   Tyeisha Dinan Essie Christine,  MD  diclofenac (VOLTAREN) 75 MG EC tablet Take 1 tablet (75 mg total) by mouth 2 (two) times daily. 05/14/16   Tresa Garter, MD     Objective:   Filed Vitals:   05/14/16 1701  BP: 132/77  Pulse: 78  Temp: 98 F (36.7 C)  TempSrc: Oral  Resp: 18  Height: 4\' 11"  (1.499 m)  Weight: 162 lb 6.4 oz (73.664 kg)  SpO2: 100%    Exam General appearance : Awake, alert, not in any distress. Speech Clear. Not toxic looking HEENT: Atraumatic and Normocephalic, pupils equally reactive to light and accomodation Neck: supple, no JVD. No cervical lymphadenopathy.  Chest:Good air entry bilaterally, no added sounds  CVS: S1 S2 regular, no murmurs.  Abdomen: Bowel sounds  present, Non tender and not distended with no gaurding, rigidity or rebound. Extremities: B/L Lower Ext shows no edema, both legs are warm to touch Neurology: Awake alert, and oriented X 3, CN II-XII intact, Non focal Skin:No Rash  Data Review No results found for: HGBA1C   Assessment & Plan   1. Hepatocellular carcinoma ([place])  Follow up with Dr. Burr Medico  2. Essential hypertension  We have discussed target BP range and blood pressure goal. I have advised patient to check BP regularly and to call us back or report to clinic if the numbers are consistently higher than 140/90. We discussed the importance of compliance with medical therapy and DASH diet recommended, consequences of uncontrolled hypertension discussed.   - continue current BP medications  3. Itching  - cholestyramine (QUESTRAN) 4 g packet; Take 1 packet (4 g total) by mouth 3 (three) times daily with meals.  Dispense: 60 each; Refill: 12 - diclofenac (VOLTAREN) 75 MG EC tablet; Take 1 tablet (75 mg total) by mouth 2 (two) times daily.  Dispense: 30 tablet; Refill: 0  Patient have been counseled extensively about nutrition and exercise  Return in about 3 months (around 08/14/2016) for Abdominal Pain, HCC.  The patient was given clear instructions to go to ER or return to medical center if symptoms don't improve, worsen or new problems develop. The patient verbalized understanding. The patient was told to call to get lab results if they haven't heard anything in the next week.   This note has been created with Surveyor, quantity. Any transcriptional errors are unintentional.    [name], MD, [place], [place], [place], [place] and [place], [place]   05/17/2016, 2:32 PM

## 2016-05-19 DIAGNOSIS — B182 Chronic viral hepatitis C: Secondary | ICD-10-CM | POA: Diagnosis not present

## 2016-05-19 DIAGNOSIS — C220 Liver cell carcinoma: Secondary | ICD-10-CM | POA: Diagnosis not present

## 2016-05-19 DIAGNOSIS — K7460 Unspecified cirrhosis of liver: Secondary | ICD-10-CM | POA: Diagnosis not present

## 2016-05-20 ENCOUNTER — Ambulatory Visit (HOSPITAL_COMMUNITY)
Admission: RE | Admit: 2016-05-20 | Discharge: 2016-05-20 | Disposition: A | Discharge status: Home / Self Care | Payer: Medicare Other | Source: Ambulatory Visit | Attending: Hematology | Admitting: Hematology

## 2016-05-20 DIAGNOSIS — R932 Abnormal findings on diagnostic imaging of liver and biliary tract: Secondary | ICD-10-CM | POA: Insufficient documentation

## 2016-05-20 DIAGNOSIS — K769 Liver disease, unspecified: Secondary | ICD-10-CM | POA: Diagnosis not present

## 2016-05-20 DIAGNOSIS — C220 Liver cell carcinoma: Secondary | ICD-10-CM | POA: Diagnosis not present

## 2016-05-20 MED ORDER — GADOXETATE DISODIUM 0.25 MMOL/ML IV SOLN
10.0000 mL | Freq: Once | INTRAVENOUS | Status: AC | PRN
  Administered 2016-05-20: 7 mL via INTRAVENOUS

## 2016-05-20 NOTE — Telephone Encounter (Signed)
Patient is calling to follow up on bed request...patient states that Heidi Booker had everything set up....  No one has followed up with her....  [facility] to [facility].....[phone] ext. [phone]   Please follow up with patient

## 2016-05-21 ENCOUNTER — Telehealth: Payer: Self-pay

## 2016-05-21 NOTE — Telephone Encounter (Signed)
nieves called to confirm appts on 6/12.

## 2016-05-25 ENCOUNTER — Ambulatory Visit (HOSPITAL_BASED_OUTPATIENT_CLINIC_OR_DEPARTMENT_OTHER): Payer: Medicare Other | Admitting: Hematology

## 2016-05-25 ENCOUNTER — Telehealth: Payer: Self-pay | Admitting: *Deleted

## 2016-05-25 ENCOUNTER — Encounter: Payer: Self-pay | Admitting: Hematology

## 2016-05-25 ENCOUNTER — Other Ambulatory Visit (HOSPITAL_BASED_OUTPATIENT_CLINIC_OR_DEPARTMENT_OTHER): Payer: Medicare Other

## 2016-05-25 VITALS — BP 135/60 | HR 63 | Temp 98.3°F | Resp 18 | Ht 59.0 in | Wt 161.8 lb

## 2016-05-25 DIAGNOSIS — B1920 Unspecified viral hepatitis C without hepatic coma: Secondary | ICD-10-CM | POA: Diagnosis not present

## 2016-05-25 DIAGNOSIS — K7460 Unspecified cirrhosis of liver: Secondary | ICD-10-CM

## 2016-05-25 DIAGNOSIS — C220 Liver cell carcinoma: Secondary | ICD-10-CM

## 2016-05-25 LAB — COMPREHENSIVE METABOLIC PANEL
ALBUMIN: 2.2 g/dL — AB (ref 3.5–5.0)
ALK PHOS: 169 U/L — AB (ref 40–150)
ALT: 128 U/L — ABNORMAL HIGH (ref 0–55)
ANION GAP: 5 meq/L (ref 3–11)
AST: 187 U/L (ref 5–34)
BUN: 16.9 mg/dL (ref 7.0–26.0)
CO2: 26 mEq/L (ref 22–29)
Calcium: 8.4 mg/dL (ref 8.4–10.4)
Chloride: 110 mEq/L — ABNORMAL HIGH (ref 98–109)
Creatinine: 0.9 mg/dL (ref 0.6–1.1)
EGFR: 66 mL/min/{1.73_m2} — AB (ref >90)
Glucose: 197 mg/dl — ABNORMAL HIGH (ref 70–140)
Potassium: 4.1 mEq/L (ref 3.5–5.1)
Sodium: 141 mEq/L (ref 136–145)
TOTAL PROTEIN: 6.4 g/dL (ref 6.4–8.3)
Total Bilirubin: 3.73 mg/dL (ref 0.20–1.20)

## 2016-05-25 LAB — CBC WITH DIFFERENTIAL/PLATELET
BASO%: 0.7 % (ref 0.0–2.0)
BASOS ABS: 0 10*3/uL (ref 0.0–0.1)
EOS ABS: 0.2 10*3/uL (ref 0.0–0.5)
EOS%: 2.8 % (ref 0.0–7.0)
HCT: 39.7 % (ref 34.8–46.6)
HEMOGLOBIN: 12.7 g/dL (ref 11.6–15.9)
LYMPH%: 30.9 % (ref 14.0–49.7)
MCH: 29.4 pg (ref 25.1–34.0)
MCHC: 31.8 g/dL (ref 31.5–36.0)
MCV: 92.3 fL (ref 79.5–101.0)
MONO#: 0.9 10*3/uL (ref 0.1–0.9)
MONO%: 16.9 % — ABNORMAL HIGH (ref 0.0–14.0)
NEUT%: 48.7 % (ref 38.4–76.8)
NEUTROS ABS: 2.7 10*3/uL (ref 1.5–6.5)
PLATELETS: 53 10*3/uL — AB (ref 145–400)
RBC: 4.31 10*6/uL (ref 3.70–5.45)
RDW: 17.8 % — ABNORMAL HIGH (ref 11.2–14.5)
WBC: 5.5 10*3/uL (ref 3.9–10.3)
lymph#: 1.7 10*3/uL (ref 0.9–3.3)

## 2016-05-25 NOTE — Telephone Encounter (Signed)
FYI Message at 1:00 pm on Triage voicemail in reference to this patient.  "She will be 30 to 45 minutes late for the 1:15 lab appointment followed by MD appointment.  She doesn't feel well but is coming in today."

## 2016-05-25 NOTE — Progress Notes (Signed)
[place]  [phone] [phone]  Clinic follow up Note   Patient Care Team: Tresa Garter, MD as PCP - General (Internal Medicine) No Pcp Per Patient (General Practice) Irene Shipper, MD as Consulting Physician (Gastroenterology) 05/25/2016   CHIEF COMPLAINTS:  Follow up [facility]  Oncology History   Hepatocellular carcinoma ([place])   Staging form: Liver (Excluding Intrahepatic Bile Ducts), AJCC 7th Edition     Clinical stage from 10/22/2014: Stage II (T2, N0, M0) - Signed by Truitt Merle, MD on 04/30/2016        Hepatocellular carcinoma ([place])   10/22/2014 Imaging MRI ABD: Increased size of 1.6 cm hypervascular mass in lateral aspect of segment 8 and 1.2 cm hypervascular mass in dome of segment 8 consistent with [facility]   11/13/2014 Tumor Marker AFP=11.8   07/04/2015 Procedure TACE Right hepatic lobe [facility])   07/05/2015 Initial Diagnosis Hepatocellular carcinoma ([place])   08/09/2015 Imaging PET: 3.5 cm right hepatic lobe hypodense lesion w/central photopenia; cirrhosis and portal venous hypertension   08/29/2015 Imaging MRI ABD: Persistent, but mildly decreased enhancement in hepatic segment #8 following TACE, suggestive of viable disease.Appears increased in size & could be due to developement of interval necrosis secondary to treatment; T12 osseous lesion   08/29/2015 Tumor Marker AFP=12.7   11/27/2015 Imaging MRI ABD: Persistent early enhancing lesions hepatic segment #8 w/washout following TACE;  A 3.4 cm segment 8 lesion has slightly increased; previously seen enhancing osseous lesion in T12 verterbral body not well visualized on current study.    HISTORY OF PRESENTING ILLNESS:  Heidi Booker 70 y.o. female with history of Hep C, liver cirrhosis and HCC, presents to our cancer center to transfer her oncological care to [place]. She is accompanied by her daughter and daughter-in-law to my clinic today.  She was diagnosed with hep C and liver cirrhosis about  two years ago in [place], but was not treated. She moved to [name] with her daughters in mid 2015, routine ultrasound of abdomen on 04/13/2014 showed 2 subcentimeter lesions in the liver. She underwent abdominal MRI on 05/12/2014, which showed a 1.2 cm and 0.8 cm lesion in segment 8, concerning for hepatocellular carcinoma. On the follow-up MRI of the abdomen on 10/22/2014, both lesions increased in size to 1.6 and 1.2 cm, and the imaging characteristic will consistent with [place]. AFP was elevated at 11.8. She was referred to [facility] for treatment. She was deemed to be not a candidate for liver surgery or liver transplant, and he received TACE in July 2016. She has been followed by Dr. Aleatha Borer at [facility], due to the stable disease, and the compromised liver function, she has not received any additional treatment since then. Sorafenib was discussed, but not offered. Hepatitis C treatment was also discussed and offered to, but she did not start since she never received the antiviral medication.  She lives with her daughter, able to take care of herself and tolerates light activities. She has moderate fatigue, abdominal bloating, no significant pain, or nausea. She has loose bowel movement 3-4 times a day, no hematochezia or melena.   CURRENT THERAPY: Supportive care  INTERIM HISTORY: Heidi Booker returns for follow-up. She is accompanied by his 2 daughters to the clinic today. She was recently seen byGina Dawn at the liver clinic for hepatitis C treatment, unfortunately treatment was not offered due to the lack of benefit giving her underlying [place] progression. She is clinically stable, no significant change since her last visit. She  still quite fatigued, came in a wheelchair, able to do some of the self care, but not much other activities.  MEDICAL HISTORY:  Past Medical History  Diagnosis Date  . Ulcer   . Hepatitis C   . Refusal of blood transfusions as patient is Jehovah's Witness   .  Hypertension   . Hepatic cirrhosis ([place]) 01/21/2015    Lasix 20mg , nadolol to prevent varices from bleeding. From Hep C.  Xifaxin causesd severe abdominal pain. Was given to reduce confusion from cirrhosis.    . Hepatitis C 01/21/2015    Under care Dr. Burnetta Sabin. Starting to treat hep c per patient.    . Depression 08/24/2014    Has a sister that is very sick and dealing with stress of this   . Nephrolithiasis 08/24/2014  . Arthritis 08/24/2014    Knees and ankles   . Cancer ([place])   . Liver cancer ([place]) 07/05/2015    SURGICAL HISTORY: Past Surgical History  Procedure Laterality Date  . Partial hysterectomy    . Knee arthroscopy Left   . Esophagogastroduodenoscopy N/A 05/09/2014    Procedure: ESOPHAGOGASTRODUODENOSCOPY (EGD);  Surgeon: Irene Shipper, MD;  Location: [facility] ENDOSCOPY;  Service: Endoscopy;  Laterality: N/A;  . Abdominal hysterectomy    . Knee surgery      "removed something"  . Tonsilectomy/adenoidectomy with myringotomy      SOCIAL HISTORY: Social History   Social History  . Marital Status: Widowed    Spouse Name: N/A  . Number of Children: 1  . Years of Education: N/A   Occupational History  . Not on file.   Social History Main Topics  . Smoking status: Never Smoker   . Smokeless tobacco: Never Used  . Alcohol Use: No     Comment: quit 24 years ago  . Drug Use: No  . Sexual Activity: No   Other Topics Concern  . Not on file   Social History Narrative   ** Merged History Encounter **   Widowed   Lives with son, 3 daughters. 4 children (lost 3 boys in childbirth). 3 grandkids.    Originally from [place]   Has sister who is very ill      Retired, stays at home.       Hobbies: stays at home    FAMILY HISTORY: Family History  Problem Relation Age of Onset  . Pulmonary fibrosis Sister   . Kidney disease Sister   . Colon cancer Neg Hx     ALLERGIES:  is allergic to other.  MEDICATIONS:  Current Outpatient Prescriptions  Medication Sig Dispense  Refill  . cholestyramine (QUESTRAN) 4 g packet Take 1 packet (4 g total) by mouth 3 (three) times daily with meals. 60 each 12  . diclofenac (VOLTAREN) 75 MG EC tablet Take 1 tablet (75 mg total) by mouth 2 (two) times daily. 30 tablet 0  . furosemide (LASIX) 20 MG tablet Take 1 tablet (20 mg total) by mouth 2 (two) times daily. 180 tablet 3  . Incontinence Supply Disposable (DEPEND UNDERWEAR X-LARGE) MISC Use as needed 26 each 11  . Misc. Devices (ADJUSTABLE COMMODE 3-IN-1) MISC 1 each by Does not apply route as needed. 1 each 0  . Misc. Devices (TRANSFER BENCH) MISC 1 each by Does not apply route as needed. 1 each 0  . pantoprazole (PROTONIX) 40 MG tablet Take 1 tablet (40 mg total) by mouth daily. (Patient taking differently: Take 40 mg by mouth 2 (two) times daily. )  90 tablet 3  . spironolactone (ALDACTONE) 50 MG tablet Take 1 tablet (50 mg total) by mouth daily. 90 tablet 3  . traMADol (ULTRAM) 50 MG tablet Take 1 tablet (50 mg total) by mouth every 8 (eight) hours as needed. 30 tablet 0  . vitamin B-12 (CYANOCOBALAMIN) 1000 MCG tablet Take 1 tablet (1,000 mcg total) by mouth daily. 90 tablet 3  . Vitamin D, Ergocalciferol, (DRISDOL) 50000 units CAPS capsule Take 1 capsule (50,000 Units total) by mouth every 7 (seven) days. 30 capsule 0   No current facility-administered medications for this visit.    REVIEW OF SYSTEMS:   Constitutional: Denies fevers, chills or abnormal night sweats Eyes: Denies blurriness of vision, double vision or watery eyes Ears, nose, mouth, throat, and face: Denies mucositis or sore throat Respiratory: Denies cough, dyspnea or wheezes Cardiovascular: Denies palpitation, chest discomfort or lower extremity swelling Gastrointestinal:  Denies nausea, heartburn or change in bowel habits Skin: Denies abnormal skin rashes Lymphatics: Denies new lymphadenopathy or easy bruising Neurological:Denies numbness, tingling or new weaknesses Behavioral/Psych: Mood is stable,  no new changes  All other systems were reviewed with the patient and are negative.  PHYSICAL EXAMINATION: ECOG PERFORMANCE STATUS: 2-3  Filed Vitals:   05/25/16 1427  BP: 135/60  Pulse: 63  Temp: 98.3 F (36.8 C)  Resp: 18   Filed Weights   05/25/16 1427  Weight: 161 lb 12.8 oz (73.392 kg)    GENERAL:alert, no distress and comfortable SKIN: skin color, texture, turgor are normal, no rashes or significant lesions EYES: normal, conjunctiva are pink and non-injected, sclera Slightly jaundice OROPHARYNX:no exudate, no erythema and lips, buccal mucosa, and tongue normal  NECK: supple, thyroid normal size, non-tender, without nodularity LYMPH:  no palpable lymphadenopathy in the cervical, axillary or inguinal LUNGS: clear to auscultation and percussion with normal breathing effort HEART: regular rate & rhythm and no murmurs and no lower extremity edema ABDOMEN:abdomen soft, non-tender and normal bowel sounds Musculoskeletal:no cyanosis of digits and no clubbing  PSYCH: alert & oriented x 3 with fluent speech NEURO: no focal motor/sensory deficits  LABORATORY DATA:  I have reviewed the data as listed  CBC Latest Ref Rng 05/05/2016 10/09/2015 07/12/2015  WBC 3.9 - 10.3 10e3/uL 5.7 7.2 10.2  Hemoglobin 11.6 - 15.9 g/dL 13.4 12.3 12.6  Hematocrit 34.8 - 46.6 % 39.7 36.6 38.5  Platelets 145 - 400 10e3/uL 52(L) 71(L) 78.0 Repeated and verified X2.(L)    CMP Latest Ref Rng 05/05/2016 04/09/2016 10/09/2015  Glucose 70 - 140 mg/dl 243(H) 179(H) 177(H)  BUN 7.0 - 26.0 mg/dL 16.6 21 12   Creatinine 0.6 - 1.1 mg/dL 0.9 0.92 0.80  Sodium 136 - 145 mEq/L 138 135 134(L)  Potassium 3.5 - 5.1 mEq/L 3.8 4.6 4.1  Chloride 98 - 110 mmol/L - 102 107  CO2 22 - 29 mEq/L 24 24 24   Calcium 8.4 - 10.4 mg/dL 8.6 8.3(L) 8.0(L)  Total Protein 6.4 - 8.3 g/dL 6.5 6.7 5.8(L)  Total Bilirubin 0.20 - 1.20 mg/dL 3.40(H) 3.3(H) 2.5(H)  Alkaline Phos 40 - 150 U/L 164(H) 124 127(H)  AST 5 - 34 U/L 156(H) 83(H)  133(H)  ALT 0 - 55 U/L 91(H) 54(H) 144(H)   AFP tumor marker  Status: Finalresult Visible to patient:  Not Released Nextappt: None Dx:  Hepatocellular carcinoma (HCC)            Ref Range 3wk ago    AFP, Serum, Tumor Marker 0.0 - 8.3 ng/mL 28.1 (H)  RADIOGRAPHIC STUDIES: I have personally reviewed the radiological images as listed and agreed with the findings in the report.  Abdominal MRI w wo contrast 05/20/2016 IMPRESSION: 1. Two dominant hepatomas in the RIGHT hepatic lobe measuring approximately 3 and 5 cm each are increased from comparison MRI 2. Multiple additional small 2 cm lesions (approximately 4) consistent with progression of smaller hepatomas. 3. Extensive venous collateralization along the gastric cardia and recanalization of the umbilical consistent with significant portal hypertension. 4. Morphologic change consistent with cirrhosis. No ascites.   ASSESSMENT & PLAN: 70 year old Spanish speaking female, with untreated hepatitis C, liver cirrhosis and hepatocellular carcinoma.  1. Hepatocellular carcinoma, multifocal, cT2N0M0, s/p TACE 06/2015 -I reviewed and discussed all her previous images with patient and her daughters. -Based on the imaging findings, her significant liver disease and liver cirrhosis, I agree with the diagnosis of [place]. -She was evaluated at [facility], and felt not to be a candidate for liver resection or transplant. She has received TACE once in July 2016. -Due to her liver cirrhosis and decompensated liver function (child Pugh B), she is not a good candidate for further liver targeted therapy and sorafenib. -I reviewed her restaging abdominal MRI scan from 05/20/2016, which showed significant disease progression of the prior 2 liver masses, and the multiple new liver lesions.  -Unfortunately she is also not a candidate for hepatitis C treatment. -I discussed her imaging findings and lab results with interventional  radiologist Dr. Laurence Ferrari who does not think she is a candidate for Y90 or second TACE due to her decompensated liver. -I discussed systemic treatment options. I recommend immunotherapy Nivolumab, which has been studied in [facility] and showed overall about 30% response rate. This has not been approved by FDA. The potential benefit and side effects, especially autoimmune-related side effects, we'll discuss with patient and her daughters. -I'll tentatively, supportive care alone or hospice is also appropriate, giving her rapid disease progression, poor performance status, and decompensated liver function. -Patient and her daughters were discussed the options further, and let me know next week.  2. Hep C, liver cirrhosis, Child-Pugh score 9 (B) -She was seen by [facility] as the liver clinic, hepatitis C treatment was not offered -She has a follow-up appointment with gastroenterologist Dr. Henrene Pastor in a few weeks  Plan -Patient's daughter will call me next week when they make a decision about her treatment. If she great agrees with no follow lab, I'll set up her first infusion in a few weeks, and try to get free drugs from pharmaceutical company.  All questions were answered. The patient knows to call the clinic with any problems, questions or concerns. I spent 25 minutes counseling the patient face to face. The total time spent in the appointment was 30 minutes and more than 50% was on counseling.     Truitt Merle, MD 05/25/2016 7:47 AM

## 2016-06-04 NOTE — Telephone Encounter (Signed)
MA left a message for Erline Levine at number provided ext [phone]. Voicemail states to give a call back to [place] with [facility] at [phone].

## 2016-06-05 ENCOUNTER — Telehealth: Payer: Self-pay | Admitting: *Deleted

## 2016-06-05 NOTE — Telephone Encounter (Signed)
Received call from daughter Debby Bud wanting to know about GI referral appt for pt as discussed at last office visit on 6/12 with Dr. Burr Medico.  Spoke with Colletta [place], scheduler at Dr. Blanch Media office.  Per Colletta Maryland, the appt on 7/6 was not correct.  As of now, pt does not have appt with Dr. Henrene Pastor.  Per Colletta Maryland,  If pt has Hep C, and liver cirrhosis, pt will be referred to [place] as Dr. Henrene Pastor does not treat Hep C condition. Attempted to call daughter back unsuccessfully.  Phone just rang and no one picked up and no voice mail available.  Message sent to Dr. Burr Medico for review on 06/08/16. Pt's    Phone     [phone].

## 2016-06-05 NOTE — Telephone Encounter (Signed)
TC from pt's daughter stating that she is having a difficult time getting an appt for her mother with Dr. Henrene Pastor. His office states that she #1 needs to go liver clinic and #2 Dr. Henrene Pastor has no availability until August. Daughter tried to explain that her mother needs to be seen by GI doctor d/t abdominal pain and nausea issues. Daughter very frustrated that she could not get an appt and is asking for our assistance.

## 2016-06-18 ENCOUNTER — Ambulatory Visit: Payer: Medicare Other | Admitting: Gastroenterology

## 2016-07-02 ENCOUNTER — Other Ambulatory Visit: Payer: Self-pay | Admitting: Hematology

## 2016-07-02 ENCOUNTER — Telehealth: Payer: Self-pay | Admitting: Hematology

## 2016-07-02 IMAGING — MR MR ABDOMEN WO/W CM
9 of 18 series · 20 of 48 positions shown · IV contrast (eovist)
Comparison: PET-CT 08/09/2015 common MRI 10/22/2014

CLINICAL DATA: Evaluate for hepatocellular carcinoma.

EXAM:
MRI ABDOMEN WITHOUT AND WITH CONTRAST
TECHNIQUE: Multiplanar multisequence MR imaging of the abdomen was performed
both before and after the administration of intravenous contrast.
CONTRAST:  7 mL Eovist

[Series 3: T2 · coronal · 5.0mm · 0.78mm/px · 1 of 34 slices shown (1 of 2)]
[im 1/34]
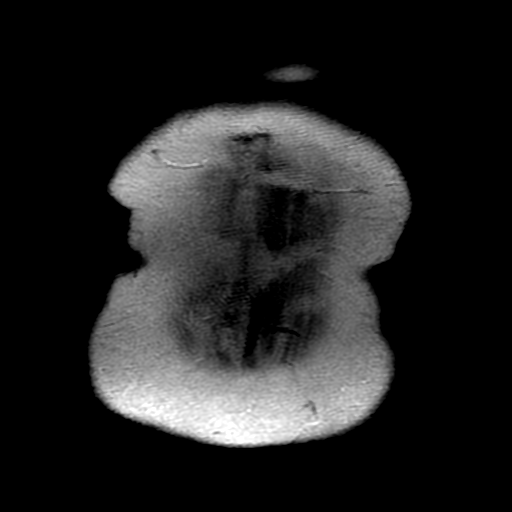

[Series 4: ax dualecho · axial · 5.0mm · 0.78mm/px · z∈[-76,+169]mm · 3 of 100 slices shown]
[im 1/100]
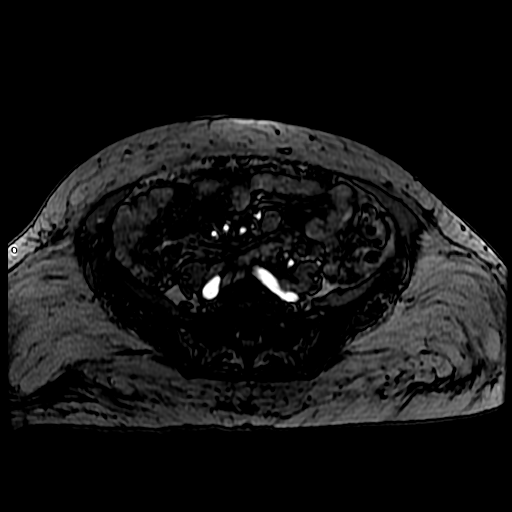
[im 50/100]
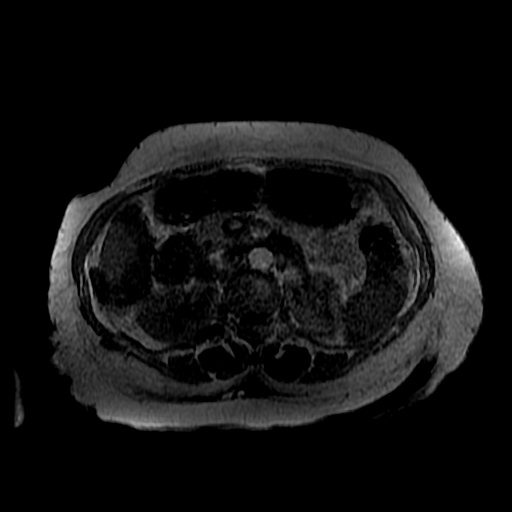
[im 100/100]
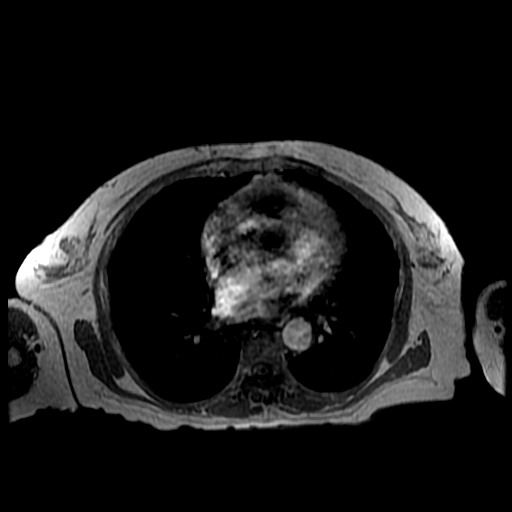

[Series 9: T2 fat-sat · axial · 5.0mm · 0.78mm/px · z∈[-63,+192]mm · 2 of 52 slices shown]
[im 1/52]
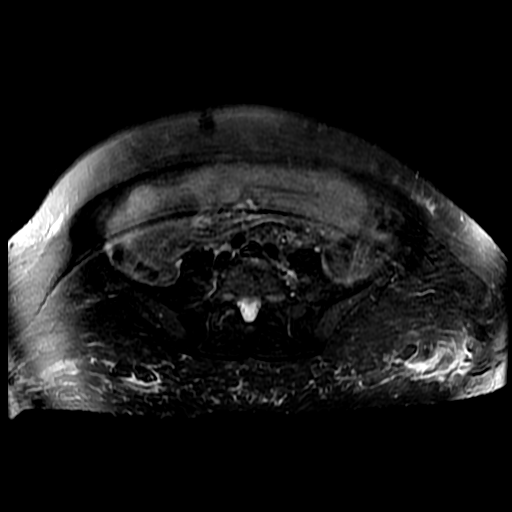
[im 52/52]
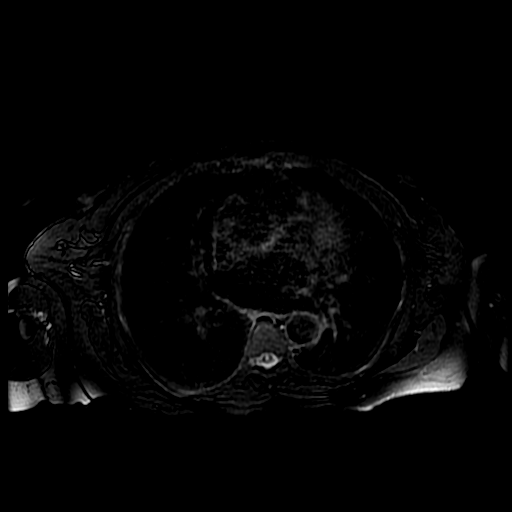

[Series 10: T2 · axial · 5.0mm · 0.78mm/px · z∈[-76,+169]mm · 2 of 50 slices shown (2 of 2)]
[im 1/50]
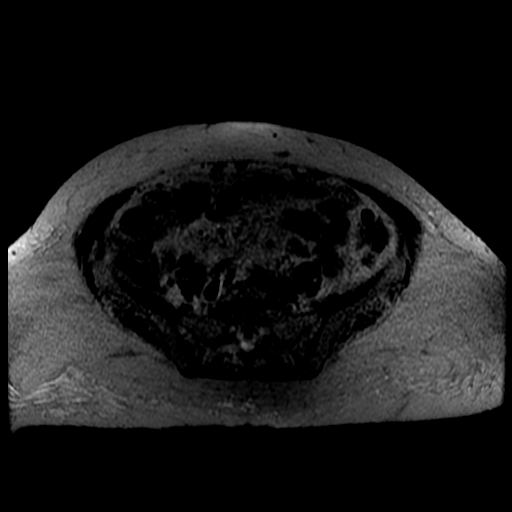
[im 50/50]
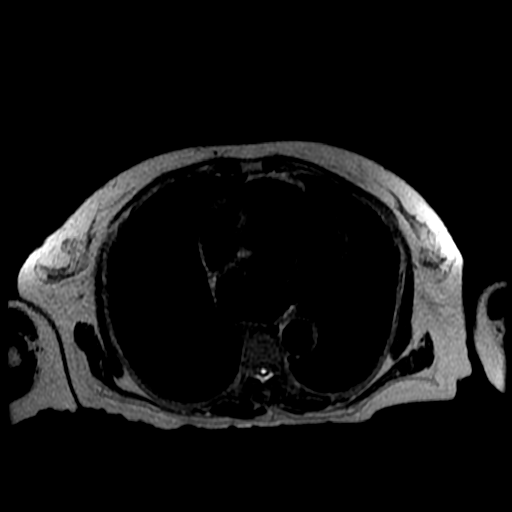

[Series 11: bSSFP fat-sat · axial · 5.0mm · 0.78mm/px · z∈[-76,+169]mm · 2 of 50 slices shown]
[im 1/50]
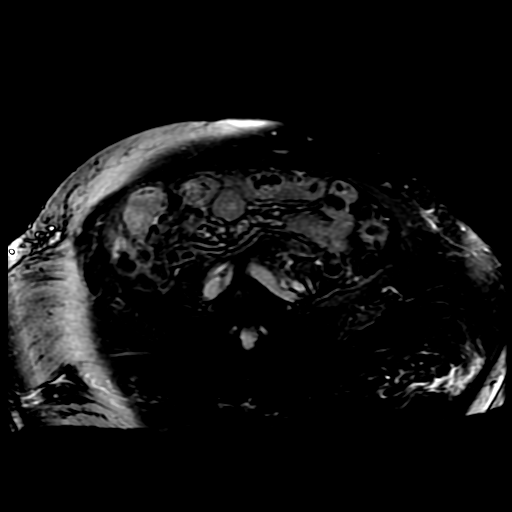
[im 50/50]
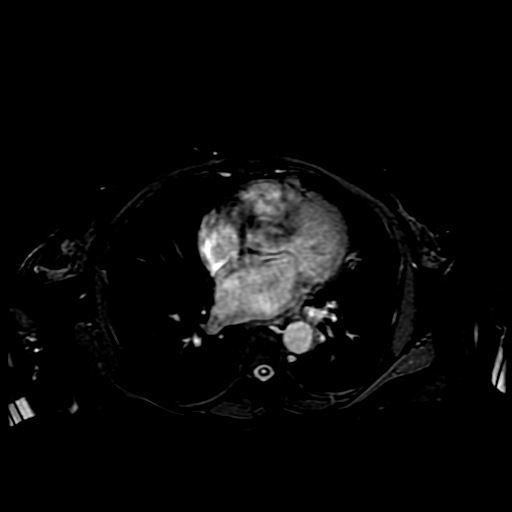

[Series 12: DWI b500 · axial · 5.0mm · 1.56mm/px · z∈[-76,+169]mm · 3 of 100 slices shown]
[im 1/100]
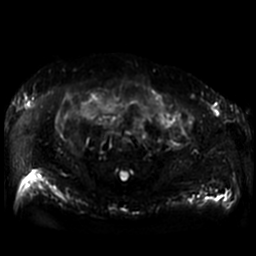
[im 50/100]
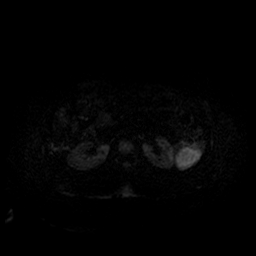
[im 100/100]
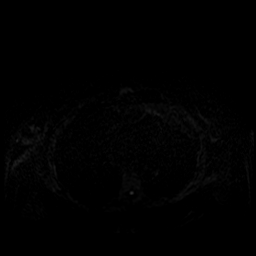

[Series 13: T1 dynamic · coronal · delayed · 4.0mm · 0.86mm/px · 3 of 88 slices shown (1 of 3)]
[im 1/88]
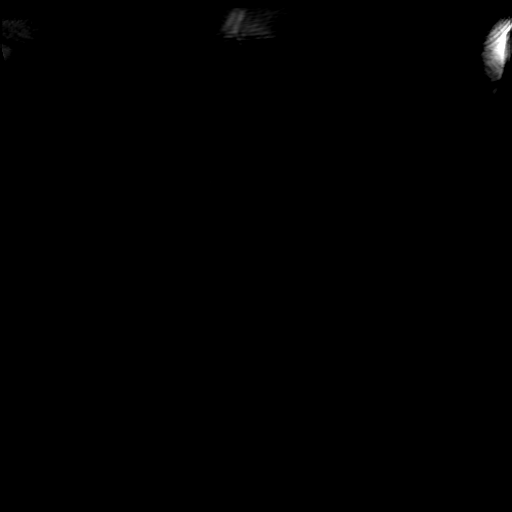
[im 44/88]
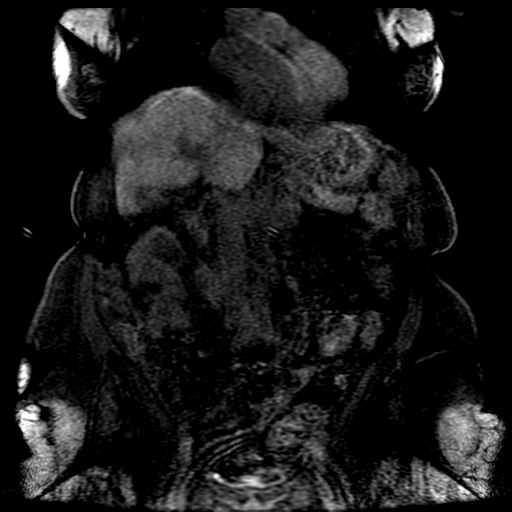
[im 88/88]
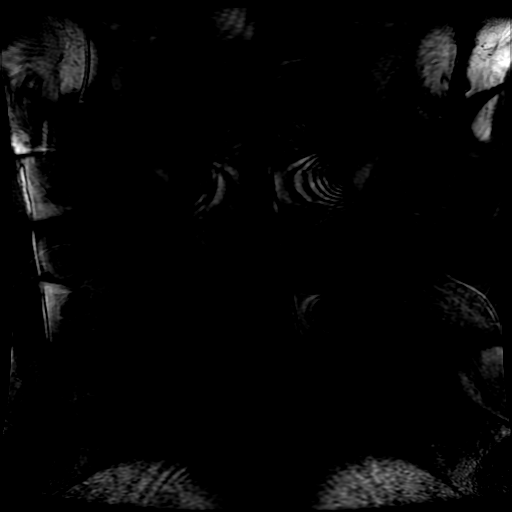

[Series 16: T1 dynamic · axial · delayed · 5.4mm · 0.78mm/px · z∈[-134,+112]mm · 3 of 92 slices shown (2 of 3)]
[im 1/92]
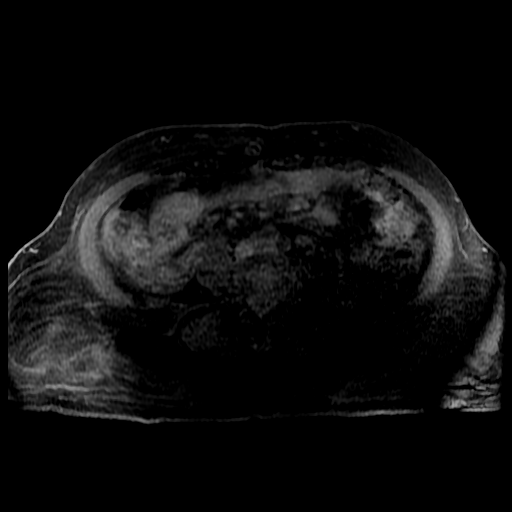
[im 46/92]
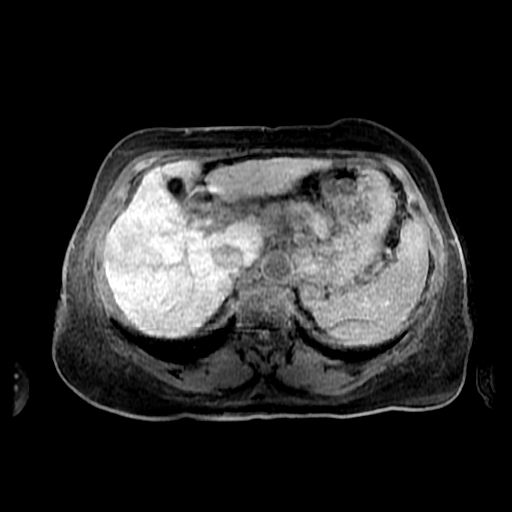
[im 92/92]
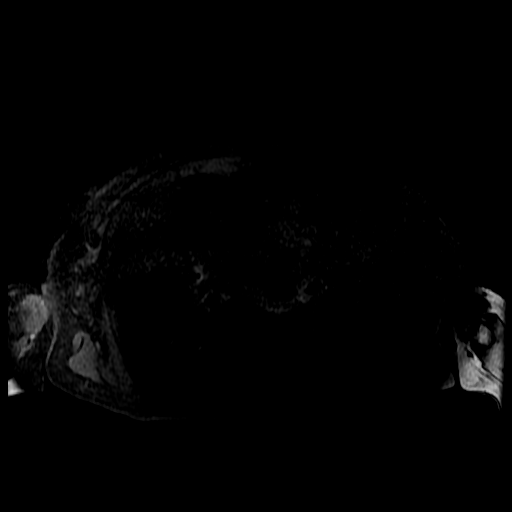

[Series 500: T1 dynamic · axial · 5.4mm · 0.78mm/px · 1 of 92 slices shown (3 of 3)]
[im 1/92]
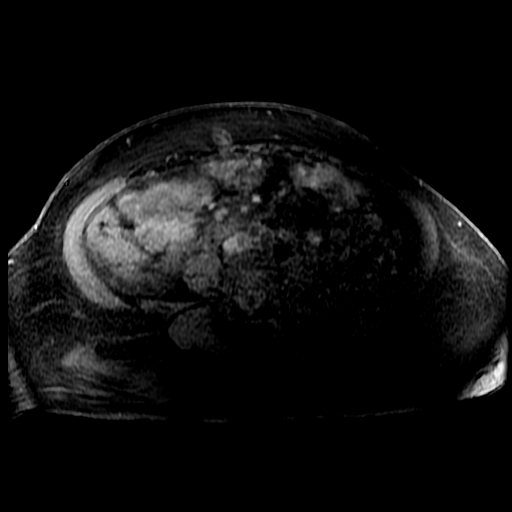

[20 of 48 positions shown; findings below may reference images not displayed]

FINDINGS: Lower chest:  Lung bases are clear.

Hepatobiliary:

Patient was removed from scan therefore subtraction imaging not be
performed sufficiently

Liver has a lobular contour consistent cirrhosis.

Within the lateral aspect of the RIGHT hepatic lobe (segment 8)
lesion of concern is increased in size measuring 5.7 by 4.6 cm
compared to roughly 3.5 x 3.3 cm on comparison PET-CT and 1.5 cm on
MRI of 10/22/2014. New large lesion second lesion in the RIGHT
hepatic lobe open segment 7) measuring 3.3 by 3.4 cm.

These 2 lesions are isointense on T2 weighted imaging with a a
peripheral hyperintense rim.

There is extensive linear high T2 signal within the liver on T2
weighted imaging consistent cirrhosis and underlying fibrosis.

The larger lesion has inherent T1 shortening on the precontrast T1
weighted imaging (series 500). Lesion demonstrates post-contrast
enhancement by direct measurement. Enhancement is uniform difficult
to distinguish visually due to underlying T1 shortening. Likewise
the lesion in the more posterior RIGHT hepatic lobe also
demonstrates measurable post-contrast enhancement. (Series [DATE]). These lesions become more isointense on the delayed series.

Cluster of 3 2 cm lesions in segment [DATE]A are increased from 1.5 cm
on prior. 32, series 501.

Pancreas: Normal pancreatic parenchymal intensity. No ductal
dilatation or inflammation.

Spleen: Normal spleen.

Adrenals/urinary tract: Adrenal glands and kidneys are normal.

Stomach/Bowel: Stomach and limited of the small bowel is
unremarkable

Vascular/Lymphatic: Alert normal caliber. There is recanalization of
the umbilical vein (image 43, series 502. There multiple venous
collaterals along the gastric cardia (image 43, series 502.

Musculoskeletal: No aggressive osseous lesion
IMPRESSION: 1. Two dominant hepatomas in the RIGHT hepatic lobe measuring
approximately 3 and 5 cm each are increased from comparison MRI
2. Multiple additional small 2 cm lesions (approximately 4)
consistent with progression of smaller hepatomas.
3. Extensive venous collateralization along the gastric cardia and
recanalization of the umbilical consistent with significant portal
hypertension.
4. Morphologic change consistent with cirrhosis.  No ascites.

## 2016-07-02 NOTE — Telephone Encounter (Signed)
I spoke with pt's Daughter Heidi Booker, she is very frustrated that Dr. Blanch Media office would not offer a follow-up appointment for her her mom. She has been have some mild rectal bleeding, fatigue, bloated. She is willing to undergo endoscopy if needed for GI bleeding workup. I'll send a message to Dr. Henrene Pastor.  I also encouraged her to call [facility] for follow up appointment, where she was seen before for hepatitis C and liver cirrhosis.   Pt has declined nivolumab for her [place], but she still wants to follow up. I'll set up a follow-up appointment in a month.  Heidi Booker  07/02/2016

## 2016-07-04 ENCOUNTER — Telehealth: Payer: Self-pay | Admitting: Hematology

## 2016-07-04 NOTE — Telephone Encounter (Signed)
Spoke with dtr re lab/fu 8/14 @ 1:45 pm. Per dtr moved from 8/16 to 8/14 on a Monday.

## 2016-07-06 ENCOUNTER — Telehealth: Payer: Self-pay | Admitting: *Deleted

## 2016-07-06 NOTE — Telephone Encounter (Signed)
  Oncology Nurse Navigator Documentation  Navigator Location: [facility] (07/06/16 1651) Navigator Encounter Type: Telephone (07/06/16 1651) Telephone: Outgoing Call (07/06/16 1651)   Message from Dr. Burr Medico that patient needs appointment with Dawn at [facility] to manage her cirrhosis.           Barriers/Navigation Needs: Coordination of Care (07/06/16 1651)   Interventions: Coordination of Care (07/06/16 1651)   Left VM with scheduler at Liver clinic requesting an appointment. Coordination of Care: Appts (07/06/16 1651)

## 2016-07-07 ENCOUNTER — Telehealth: Payer: Self-pay

## 2016-07-07 ENCOUNTER — Telehealth: Payer: Self-pay | Admitting: *Deleted

## 2016-07-07 ENCOUNTER — Encounter: Payer: Self-pay | Admitting: *Deleted

## 2016-07-07 NOTE — Telephone Encounter (Signed)
Daughter returned nurse's call.   Informed N

## 2016-07-07 NOTE — Telephone Encounter (Signed)
I spoke with the patient's daughter Debby Bud.  She is still waiting to hear from [facility] liver care.  They will call back to set up an appt if Dawn from [facility] feels she needs other GI care.

## 2016-07-07 NOTE — Telephone Encounter (Signed)
-----   Message from Irene Shipper, MD sent at 07/06/2016  9:17 AM EDT -----  Please see below and contact daughter as requested by Dr. Burr Medico ----- Message ----- From: Truitt Merle, MD Sent: 07/06/2016   9:07 AM To: Jarvis Morgan, RN, Irene Shipper, MD  Jenny Reichmann, thanks a lot.  Thu, please let her daughter know that I have called the Liver clinic and spoke with APP Dawn's nurse, she will call her to schedule her a follow up appointment.   Thanks,  Krista Blue  ----- Message ----- From: Irene Shipper, MD Sent: 07/05/2016   4:06 PM To: Truitt Merle, MD  Has been evaluated in my office previously (10-2015) for rectal bleeding secondary to fissure. As for the complex liver history, best she follow up with the liver specialties clinic. We are happy to see her as needed for routine GI care. Does she have a PCP? If not, that would be helpful to tend to basic needs and request specialty help as needed. Thanks  ----- Message ----- From: Truitt Merle, MD Sent: 07/02/2016   5:23 PM To: Irene Shipper, MD  Dr. Henrene Pastor,  Please see the recent 3 telephone notes on this pt. She was seen by liver clinic but hepatitis C treatment was not offered. I offered her liver cancer treatment but patient declined. She has been having some mild rectal bleeding, and other symptoms which probably related to liver cirrhosis and liver cancer. Her daughter has been very frustrated because she could not get a follow-up appointment with you. Patient is willing to undergo endoscopy if you feel needed. If you do not see liver cirrhosis pts anymore, then she probably would follow up with the [facility] liver care clinic. Please advise when you return to office.  Thanks,  Truitt Merle

## 2016-07-07 NOTE — Telephone Encounter (Signed)
Informed daughter Heidi Booker that Dr. Burr Medico had contacted APP Dawn at the Liver clinic about pt's follow up.  Liver Clinic will contact pt with appt.  Daughter voiced understanding.

## 2016-07-07 NOTE — Telephone Encounter (Signed)
Called pt at home and left message on voice mail requesting a call back to nurse about Liver clinic.

## 2016-07-16 ENCOUNTER — Telehealth: Payer: Self-pay | Admitting: *Deleted

## 2016-07-16 NOTE — Telephone Encounter (Signed)
Received vm call from daughter stating that she was unable to get an appt with Dr Henrene Pastor b/c they wanted to wait until her mother was seen at the [facility].  She states that she is still waiting to hear from the clinic & Dr Burr Medico had made arrangements.  Returned call & informed daughter that I would try to contact the Clinic tomorrow & try to get some information for her.

## 2016-07-20 ENCOUNTER — Telehealth: Payer: Self-pay | Admitting: *Deleted

## 2016-07-20 NOTE — Telephone Encounter (Signed)
Oncology Nurse Navigator Documentation  Oncology Nurse Navigator Flowsheets 07/20/2016  Navigator Location [facility]  Navigator Encounter Type Telephone  Telephone Outgoing Call;Appt Confirmation/Clarification  Confirmed Diagnosis Date -  Treatment Initiated Date -  Barriers/Navigation Needs Coordination of Care--appointment at [facility] with [facility], APP  Interventions Coordination of Care--scheduled for 08/04/16 at 0915/0930   Coordination of Care Appts--Left VM for daughter with appointment and requested return call to confirm.  Acuity Level 1  Time Spent with Patient 15

## 2016-07-27 ENCOUNTER — Ambulatory Visit (HOSPITAL_BASED_OUTPATIENT_CLINIC_OR_DEPARTMENT_OTHER): Payer: Medicare Other | Admitting: Hematology

## 2016-07-27 ENCOUNTER — Telehealth: Payer: Self-pay | Admitting: Hematology

## 2016-07-27 ENCOUNTER — Other Ambulatory Visit (HOSPITAL_BASED_OUTPATIENT_CLINIC_OR_DEPARTMENT_OTHER): Payer: Medicare Other

## 2016-07-27 VITALS — BP 110/54 | HR 57 | Temp 98.4°F | Resp 18 | Ht 59.0 in | Wt 156.3 lb

## 2016-07-27 DIAGNOSIS — K219 Gastro-esophageal reflux disease without esophagitis: Secondary | ICD-10-CM

## 2016-07-27 DIAGNOSIS — B182 Chronic viral hepatitis C: Secondary | ICD-10-CM | POA: Diagnosis not present

## 2016-07-27 DIAGNOSIS — C220 Liver cell carcinoma: Secondary | ICD-10-CM

## 2016-07-27 DIAGNOSIS — K5900 Constipation, unspecified: Secondary | ICD-10-CM

## 2016-07-27 DIAGNOSIS — K7469 Other cirrhosis of liver: Secondary | ICD-10-CM | POA: Diagnosis not present

## 2016-07-27 LAB — CBC WITH DIFFERENTIAL/PLATELET
BASO%: 0.5 % (ref 0.0–2.0)
BASOS ABS: 0 10*3/uL (ref 0.0–0.1)
EOS%: 4.2 % (ref 0.0–7.0)
Eosinophils Absolute: 0.2 10*3/uL (ref 0.0–0.5)
HCT: 35.6 % (ref 34.8–46.6)
HEMOGLOBIN: 12 g/dL (ref 11.6–15.9)
LYMPH#: 2.1 10*3/uL (ref 0.9–3.3)
LYMPH%: 37.5 % (ref 14.0–49.7)
MCH: 29.1 pg (ref 25.1–34.0)
MCHC: 33.7 g/dL (ref 31.5–36.0)
MCV: 86.2 fL (ref 79.5–101.0)
MONO#: 0.9 10*3/uL (ref 0.1–0.9)
MONO%: 17.2 % — ABNORMAL HIGH (ref 0.0–14.0)
NEUT%: 40.6 % (ref 38.4–76.8)
NEUTROS ABS: 2.2 10*3/uL (ref 1.5–6.5)
NRBC: 0 % (ref 0–0)
Platelets: 63 10*3/uL — ABNORMAL LOW (ref 145–400)
RBC: 4.13 10*6/uL (ref 3.70–5.45)
RDW: 17.8 % — AB (ref 11.2–14.5)
WBC: 5.5 10*3/uL (ref 3.9–10.3)

## 2016-07-27 LAB — COMPREHENSIVE METABOLIC PANEL
ALBUMIN: 2 g/dL — AB (ref 3.5–5.0)
ALK PHOS: 178 U/L — AB (ref 40–150)
ALT: 73 U/L — AB (ref 0–55)
AST: 137 U/L — ABNORMAL HIGH (ref 5–34)
Anion Gap: 4 mEq/L (ref 3–11)
BILIRUBIN TOTAL: 3.67 mg/dL — AB (ref 0.20–1.20)
BUN: 15.3 mg/dL (ref 7.0–26.0)
CO2: 26 meq/L (ref 22–29)
CREATININE: 1 mg/dL (ref 0.6–1.1)
Calcium: 8.4 mg/dL (ref 8.4–10.4)
Chloride: 108 mEq/L (ref 98–109)
EGFR: 60 mL/min/{1.73_m2} — AB (ref >90)
GLUCOSE: 166 mg/dL — AB (ref 70–140)
Potassium: 4.2 mEq/L (ref 3.5–5.1)
SODIUM: 138 meq/L (ref 136–145)
TOTAL PROTEIN: 6.5 g/dL (ref 6.4–8.3)

## 2016-07-27 LAB — PROTIME-INR
INR: 1.5 — ABNORMAL LOW (ref 2.00–3.50)
Protime: 18 Seconds — ABNORMAL HIGH (ref 10.6–13.4)

## 2016-07-27 MED ORDER — MIRTAZAPINE 7.5 MG PO TABS: 7.5000 mg | ORAL_TABLET | Freq: Every day | ORAL | 2 refills | Status: DC

## 2016-07-27 MED ORDER — PANTOPRAZOLE SODIUM 40 MG PO TBEC: 40.0000 mg | DELAYED_RELEASE_TABLET | Freq: Two times a day (BID) | ORAL | 2 refills | Status: DC

## 2016-07-27 NOTE — Telephone Encounter (Signed)
[facility] @ [phone] & received appt for this week & gave to pt.

## 2016-07-27 NOTE — Progress Notes (Signed)
[facility]  [phone] [phone]  Clinic follow up Note   Patient Care Team: Tresa Garter, MD as PCP - General (Internal Medicine) No Pcp Per Patient (General Practice) Irene Shipper, MD as Consulting Physician (Gastroenterology) 07/27/2016   CHIEF COMPLAINTS:  Follow up [facility]  Oncology History   Hepatocellular carcinoma ([place])   Staging form: Liver (Excluding Intrahepatic Bile Ducts), AJCC 7th Edition     Clinical stage from 10/22/2014: Stage II (T2, N0, M0) - Signed by Truitt Merle, MD on 04/30/2016        Hepatocellular carcinoma ([place])   10/22/2014 Imaging    MRI ABD: Increased size of 1.6 cm hypervascular mass in lateral aspect of segment 8 and 1.2 cm hypervascular mass in dome of segment 8 consistent with [facility]      11/13/2014 Tumor Marker    AFP=11.8      07/04/2015 Procedure    TACE Right hepatic lobe [facility])      07/05/2015 Initial Diagnosis    Hepatocellular carcinoma ([place])      08/09/2015 Imaging    PET: 3.5 cm right hepatic lobe hypodense lesion w/central photopenia; cirrhosis and portal venous hypertension      08/29/2015 Imaging    MRI ABD: Persistent, but mildly decreased enhancement in hepatic segment #8 following TACE, suggestive of viable disease.Appears increased in size & could be due to developement of interval necrosis secondary to treatment; T12 osseous lesion      08/29/2015 Tumor Marker    AFP=12.7      11/27/2015 Imaging    MRI ABD: Persistent early enhancing lesions hepatic segment #8 w/washout following TACE;  A 3.4 cm segment 8 lesion has slightly increased; previously seen enhancing osseous lesion in T12 verterbral body not well visualized on current study.      05/20/2016 Imaging    Abdominal MRI w wo contrast showed 2 dominant right hepatic lobe lesions, measuring 3 in the 5 cm each, increased from prior scan. Multiple additional small 2 cm lesions (4) consistent with disease progression, liver cirrhosis  and significant portal hypertension.       HISTORY OF PRESENTING ILLNESS:  Heidi Booker 70 y.o. female with history of Hep C, liver cirrhosis and HCC, presents to our cancer center to transfer her oncological care to [place]. She is accompanied by her daughter and daughter-in-law to my clinic today.  She was diagnosed with hep C and liver cirrhosis about two years ago in [place], but was not treated. She moved to [facility] with her daughters in mid 2015, routine ultrasound of abdomen on 04/13/2014 showed 2 subcentimeter lesions in the liver. She underwent abdominal MRI on 05/12/2014, which showed a 1.2 cm and 0.8 cm lesion in segment 8, concerning for hepatocellular carcinoma. On the follow-up MRI of the abdomen on 10/22/2014, both lesions increased in size to 1.6 and 1.2 cm, and the imaging characteristic will consistent with [place]. AFP was elevated at 11.8. She was referred to [facility] for [facility]. She was deemed to be not a candidate for liver surgery or liver transplant, and he received TACE in July 2016. She has been followed by Dr. Aleatha Borer at [facility], due to the stable disease, and the compromised liver function, she has not received any additional treatment since then. Sorafenib was discussed, but not offered. Hepatitis C treatment was also discussed and offered to, but she did not start since she never received the antiviral medication.  She lives with her daughter, able to take  care of herself and tolerates light activities. She has moderate fatigue, abdominal bloating, no significant pain, or nausea. She has loose bowel movement 3-4 times a day, no hematochezia or melena.   CURRENT THERAPY: Supportive care  INTERIM HISTORY: Mrs. Tamez returns for follow-up. She is accompanied by HER-2 daughters to clinic today. She was last seen by me 2 months ago. She is constipated, uses MiraLAX as needed once a day. No significant abdominal pain, bloating, or nausea. She also has left knee pain  and cramps, not physically active. She has had intermittent headaches this month, she takes Tylenol or ibuprofen as needed once a while. Her appetite is moderate to low, she eats small meals. She has lost about 5 pounds in the past 2 months.  MEDICAL HISTORY:  Past Medical History:  Diagnosis Date  . Arthritis 08/24/2014   Knees and ankles   . Cancer ([place])   . Depression 08/24/2014   Has a sister that is very sick and dealing with stress of this   . Hepatic cirrhosis ([place]) 01/21/2015   Lasix 35m, nadolol to prevent varices from bleeding. From Hep C.  Xifaxin causesd severe abdominal pain. Was given to reduce confusion from cirrhosis.    . Hepatitis C   . Hepatitis C 01/21/2015   Under care Dr. ZBurnetta Sabin Starting to treat hep c per patient.    . Hypertension   . Liver cancer [place] 07/05/2015  . Nephrolithiasis 08/24/2014  . Refusal of blood transfusions as patient is Jehovah's Witness   . Ulcer     SURGICAL HISTORY: Past Surgical History:  Procedure Laterality Date  . ABDOMINAL HYSTERECTOMY    . ESOPHAGOGASTRODUODENOSCOPY N/A 05/09/2014   Procedure: ESOPHAGOGASTRODUODENOSCOPY (EGD);  Surgeon: JIrene Shipper MD;  [facility];  Service: Endoscopy;  Laterality: N/A;  . KNEE ARTHROSCOPY Left   . KNEE SURGERY     "removed something"  . PARTIAL HYSTERECTOMY    . TONSILECTOMY/ADENOIDECTOMY WITH MYRINGOTOMY      SOCIAL HISTORY: Social History   Social History  . Marital status: Widowed    Spouse name: N/A  . Number of children: 1  . Years of education: N/A   Occupational History  . Not on file.   Social History Main Topics  . Smoking status: Never Smoker  . Smokeless tobacco: Never Used  . Alcohol use No     Comment: quit 24 years ago  . Drug use: No  . Sexual activity: No   Other Topics Concern  . Not on file   Social History Narrative   ** Merged History Encounter **   Widowed   Lives with son, 3 daughters. 4 children (lost 3 boys in childbirth). 3 grandkids.     [place]  Has sister who is very ill      Retired, stays at home.       Hobbies: stays at home    FAMILY HISTORY: Family History  Problem Relation Age of Onset  . Pulmonary fibrosis Sister   . Kidney disease Sister   . Colon cancer Neg Hx     ALLERGIES:  is allergic to other.  MEDICATIONS:  Current Outpatient Prescriptions  Medication Sig Dispense Refill  . cholestyramine (QUESTRAN) 4 g packet Take 1 packet (4 g total) by mouth 3 (three) times daily with meals. 60 each 12  . diclofenac (VOLTAREN) 75 MG EC tablet Take 1 tablet (75 mg total) by mouth 2 (two) times daily. 30 tablet 0  .  furosemide (LASIX) 20 MG tablet Take 1 tablet (20 mg total) by mouth 2 (two) times daily. 180 tablet 3  . Incontinence Supply Disposable (DEPEND UNDERWEAR X-LARGE) MISC Use as needed 26 each 11  . Misc. Devices (ADJUSTABLE COMMODE 3-IN-1) MISC 1 each by Does not apply route as needed. 1 each 0  . Misc. Devices (TRANSFER BENCH) MISC 1 each by Does not apply route as needed. 1 each 0  . pantoprazole (PROTONIX) 40 MG tablet Take 1 tablet (40 mg total) by mouth daily. (Patient taking differently: Take 40 mg by mouth 2 (two) times daily. ) 90 tablet 3  . spironolactone (ALDACTONE) 50 MG tablet Take 1 tablet (50 mg total) by mouth daily. 90 tablet 3  . traMADol (ULTRAM) 50 MG tablet Take 1 tablet (50 mg total) by mouth every 8 (eight) hours as needed. 30 tablet 0  . vitamin B-12 (CYANOCOBALAMIN) 1000 MCG tablet Take 1 tablet (1,000 mcg total) by mouth daily. 90 tablet 3  . Vitamin D, Ergocalciferol, (DRISDOL) 50000 units CAPS capsule Take 1 capsule (50,000 Units total) by mouth every 7 (seven) days. 30 capsule 0   No current facility-administered medications for this visit.     REVIEW OF SYSTEMS:   Constitutional: Denies fevers, chills or abnormal night sweats Eyes: Denies blurriness of vision, double vision or watery eyes Ears, nose, mouth, throat, and face: Denies mucositis or  sore throat Respiratory: Denies cough, dyspnea or wheezes Cardiovascular: Denies palpitation, chest discomfort or lower extremity swelling Gastrointestinal:  Denies nausea, heartburn or change in bowel habits Skin: Denies abnormal skin rashes Lymphatics: Denies new lymphadenopathy or easy bruising Neurological:Denies numbness, tingling or new weaknesses Behavioral/Psych: Mood is stable, no new changes  All other systems were reviewed with the patient and are negative.  PHYSICAL EXAMINATION: ECOG PERFORMANCE STATUS: 2-3  Vitals:   07/27/16 1501  BP: (!) 110/54  Pulse: (!) 57  Resp: 18  Temp: 98.4 F (36.9 C)   Filed Weights   07/27/16 1501  Weight: 156 lb 4.8 oz (70.9 kg)    GENERAL:alert, no distress and comfortable SKIN: skin color, texture, turgor are normal, no rashes or significant lesions EYES: normal, conjunctiva are pink and non-injected, sclera Slightly jaundice OROPHARYNX:no exudate, no erythema and lips, buccal mucosa, and tongue normal  NECK: supple, thyroid normal size, non-tender, without nodularity LYMPH:  no palpable lymphadenopathy in the cervical, axillary or inguinal LUNGS: clear to auscultation and percussion with normal breathing effort HEART: regular rate & rhythm and no murmurs and no lower extremity edema ABDOMEN:abdomen soft, non-tender and normal bowel sounds Musculoskeletal:no cyanosis of digits and no clubbing  PSYCH: alert & oriented x 3 with fluent speech NEURO: no focal motor/sensory deficits  LABORATORY DATA:  I have reviewed the data as listed  CBC Latest Ref Rng & Units 07/27/2016 05/25/2016 05/05/2016  WBC 3.9 - 10.3 10e3/uL 5.5 5.5 5.7  Hemoglobin 11.6 - 15.9 g/dL 12.0 12.7 13.4  Hematocrit 34.8 - 46.6 % 35.6 39.7 39.7  Platelets 145 - 400 10e3/uL 63 Large & giant platelets(L) 53(L) 52(L)    CMP Latest Ref Rng & Units 07/27/2016 05/25/2016 05/05/2016  Glucose 70 - 140 mg/dl 166(H) 197(H) 243(H)  BUN 7.0 - 26.0 mg/dL 15.3 16.9 16.6    Creatinine 0.6 - 1.1 mg/dL 1.0 0.9 0.9  Sodium 136 - 145 mEq/L 138 141 138  Potassium 3.5 - 5.1 mEq/L 4.2 4.1 3.8  Chloride 98 - 110 mmol/L - - -  CO2 22 - 29 mEq/L 26 26  24  Calcium 8.4 - 10.4 mg/dL 8.4 8.4 8.6  Total Protein 6.4 - 8.3 g/dL 6.5 6.4 6.5  Total Bilirubin 0.20 - 1.20 mg/dL 3.67(HH) 3.73(HH) 3.40(H)  Alkaline Phos 40 - 150 U/L 178(H) 169(H) 164(H)  AST 5 - 34 U/L 137(H) 187(HH) 156(H)  ALT 0 - 55 U/L 73(H) 128(H) 91(H)   Results for SHATORI, BERTUCCI (MRN [id number]) as of 08/09/2016 14:12  Ref. Range 05/10/2014 16:15 05/05/2016 14:44 07/27/2016 14:34  AFP Tumor Marker Latest Ref Range: 0.0 - 8.0 ng/mL 7.5    AFP, Serum, Tumor Marker Latest Ref Range: 0.0 - 8.3 ng/mL  28.1 (H) 74.9 (H)    RADIOGRAPHIC STUDIES: I have personally reviewed the radiological images as listed and agreed with the findings in the report.  Abdominal MR w wo contrast 05/20/2016 IMPRESSION: 1. Two dominant hepatomas in the RIGHT hepatic lobe measuring approximately 3 and 5 cm each are increased from comparison MRI 2. Multiple additional small 2 cm lesions (approximately 4) consistent with progression of smaller hepatomas. 3. Extensive venous collateralization along the gastric cardia and recanalization of the umbilical consistent with significant portal hypertension. 4. Morphologic change consistent with cirrhosis. No ascites.   ASSESSMENT & PLAN: 70 year old Spanish speaking female, with untreated hepatitis C, liver cirrhosis and hepatocellular carcinoma.  1. Hepatocellular carcinoma, multifocal, cT2N0M0, s/p TACE 06/2015 -I reviewed and discussed all her previous images with patient and her daughters. -Based on the imaging findings, her significant liver disease and liver cirrhosis, I agree with the diagnosis of [place]. -She was evaluated at [facility], and felt not to be a candidate for liver resection or transplant. She has received TACE once in July 2016. -Due to her liver cirrhosis and decompensated  liver function (child Pugh B), she is not a good candidate for further liver targeted therapy and sorafenib. -I reviewed her restaging abdominal MRI scan from 05/20/2016, which showed significant disease progression of the prior 2 liver masses, and the multiple new liver lesions.  -Her FFP has significantly increased, consistent with disease progression. -Unfortunately she is also not a candidate for hepatitis C treatment. -I discussed her imaging findings and lab results with interventional radiologist Dr. Laurence Ferrari who does not think she is a candidate for Y90 or second TACE due to her decompensated liver. -I discussed systemic treatment options. I recommend immunotherapy Nivolumab, which has been studied in [facility] and showed overall about 20-30% response rate. This has not been approved by FDA. The potential benefit and side effects, especially autoimmune-related side effects, were discussed with patient and her daughters again today. She previously declined, but will think about it again.  -Alternatentatively, supportive care alone or hospice is also appropriate, giving her rapid disease progression, poor performance status, and decompensated liver function. She declined Hospice.  -We will continue monitoring and supportive care   2. Hep C, liver cirrhosis, Child-Pugh score 9 (B) -She was seen by [facility] as the liver clinic, hepatitis C treatment was not offered -She will follow-up with Dawn for her liver cirrhosis management  3. Constipation -I strongly encouraged her to avoid constipation, due to the concern of hepatic encephalopathy -I give her a prescription of lactulose, I recommend her to use it as needed to keep soft bowel movement 2-3 times a day  Plan -she will follow up with [place] [facility] [place] [facility] next week, I made the follow up appointment for her today  -mirtazapine 7.82m at bedtime for depression and insomnia   -RTC in 3-4 months for f/u   All  questions were answered. The  patient knows to call the clinic with any problems, questions or concerns. I spent 25 minutes counseling the patient face to face. The total time spent in the appointment was 30 minutes and more than 50% was on counseling.     Truitt Merle, MD 07/27/2016

## 2016-07-27 NOTE — Telephone Encounter (Signed)
GAVE PATIENT RELATIVE AVS REPORT AND APPOINTMENTS FOR November.

## 2016-07-28 LAB — AFP TUMOR MARKER: AFP, Serum, Tumor Marker: 74.9 ng/mL — ABNORMAL HIGH (ref 0.0–8.3)

## 2016-07-29 ENCOUNTER — Other Ambulatory Visit: Payer: Medicare Other

## 2016-07-29 ENCOUNTER — Ambulatory Visit: Payer: Medicare Other | Admitting: Hematology

## 2016-08-09 ENCOUNTER — Encounter: Payer: Self-pay | Admitting: Hematology

## 2016-08-10 ENCOUNTER — Other Ambulatory Visit: Payer: Medicare Other

## 2016-08-10 DIAGNOSIS — B182 Chronic viral hepatitis C: Secondary | ICD-10-CM | POA: Diagnosis not present

## 2016-08-10 DIAGNOSIS — C220 Liver cell carcinoma: Secondary | ICD-10-CM | POA: Diagnosis not present

## 2016-08-10 DIAGNOSIS — Z209 Contact with and (suspected) exposure to unspecified communicable disease: Secondary | ICD-10-CM | POA: Insufficient documentation

## 2016-08-11 LAB — HEPATITIS B SURFACE ANTIGEN: HEP B S AG: NEGATIVE

## 2016-08-11 LAB — HEPATITIS B CORE ANTIBODY, TOTAL: Hep B Core Total Ab: REACTIVE — AB

## 2016-08-11 LAB — HEPATITIS A ANTIBODY, TOTAL: Hep A Total Ab: REACTIVE — AB

## 2016-08-11 LAB — HEPATITIS B SURFACE ANTIBODY,QUALITATIVE: Hep B S Ab: POSITIVE — AB

## 2016-08-11 LAB — AFP TUMOR MARKER: AFP-Tumor Marker: 92.1 ng/mL — ABNORMAL HIGH (ref <6.1)

## 2016-08-13 ENCOUNTER — Telehealth: Payer: Self-pay | Admitting: Lab

## 2016-08-13 ENCOUNTER — Telehealth: Payer: Self-pay | Admitting: *Deleted

## 2016-08-13 LAB — LIVER FIBROSIS, FIBROTEST-ACTITEST
ALT: 57 U/L — ABNORMAL HIGH (ref 6–29)
APOLIPOPROTEIN A1: 81 mg/dL — AB (ref 101–198)
Alpha-2-Macroglobulin: 211 mg/dL (ref 106–279)
BILIRUBIN: 3.6 mg/dL — AB (ref 0.2–1.2)
Fibrosis Score: 0.97
GGT: 97 U/L — AB (ref 3–65)
Haptoglobin: <8 mg/dL — ABNORMAL LOW (ref 43–212)
Necroinflammat ACT Score: 0.59
REFERENCE ID: 1617492

## 2016-08-13 NOTE — Telephone Encounter (Signed)
Ca

## 2016-08-13 NOTE — Telephone Encounter (Signed)
Patient called, asked if copies of her labs could be sent to her home once resulted. Landis Gandy, RN

## 2016-08-13 NOTE — Telephone Encounter (Signed)
Called patient's home and spoke with daughter.  Per Dr Linus Salmons, pt doesn't need appmt with him on 9/13, which was canceled.  He said it would be better for patientt if she stayed at [facility] for care.  Treatment here would not be beneficial for her.  I advised her to call Liver Clinic or PCP's office for any additional clinical advise.

## 2016-08-14 ENCOUNTER — Other Ambulatory Visit: Payer: Self-pay | Admitting: Hematology

## 2016-08-14 LAB — HCV RNA, QUANT REAL-TIME PCR W/REFLEX
HCV RNA, PCR, QN (Log): 4.12 LogIU/mL — ABNORMAL HIGH
HCV RNA, PCR, QN: 13100 [IU]/mL — AB

## 2016-08-14 LAB — HCV RNA,LIPA RFLX NS5A DRUG RESIST

## 2016-08-15 LAB — HCV RNA NS5A DRUG RESISTANCE

## 2016-08-26 ENCOUNTER — Encounter: Payer: Medicare Other | Admitting: Internal Medicine

## 2016-09-03 ENCOUNTER — Telehealth: Payer: Self-pay | Admitting: *Deleted

## 2016-09-03 ENCOUNTER — Ambulatory Visit: Payer: Medicare Other | Attending: Internal Medicine | Admitting: Internal Medicine

## 2016-09-03 VITALS — BP 100/67 | HR 67 | Temp 98.2°F | Resp 18 | Ht <= 58 in | Wt 158.4 lb

## 2016-09-03 DIAGNOSIS — H9202 Otalgia, left ear: Secondary | ICD-10-CM | POA: Insufficient documentation

## 2016-09-03 DIAGNOSIS — B1920 Unspecified viral hepatitis C without hepatic coma: Secondary | ICD-10-CM | POA: Insufficient documentation

## 2016-09-03 DIAGNOSIS — C220 Liver cell carcinoma: Secondary | ICD-10-CM | POA: Insufficient documentation

## 2016-09-03 DIAGNOSIS — L299 Pruritus, unspecified: Secondary | ICD-10-CM | POA: Insufficient documentation

## 2016-09-03 DIAGNOSIS — M1710 Unilateral primary osteoarthritis, unspecified knee: Secondary | ICD-10-CM | POA: Diagnosis not present

## 2016-09-03 DIAGNOSIS — K7469 Other cirrhosis of liver: Secondary | ICD-10-CM | POA: Diagnosis not present

## 2016-09-03 DIAGNOSIS — Z79899 Other long term (current) drug therapy: Secondary | ICD-10-CM | POA: Insufficient documentation

## 2016-09-03 DIAGNOSIS — M179 Osteoarthritis of knee, unspecified: Secondary | ICD-10-CM | POA: Diagnosis not present

## 2016-09-03 DIAGNOSIS — R109 Unspecified abdominal pain: Secondary | ICD-10-CM

## 2016-09-03 DIAGNOSIS — I10 Essential (primary) hypertension: Secondary | ICD-10-CM | POA: Diagnosis not present

## 2016-09-03 DIAGNOSIS — Z87442 Personal history of urinary calculi: Secondary | ICD-10-CM | POA: Insufficient documentation

## 2016-09-03 DIAGNOSIS — F329 Major depressive disorder, single episode, unspecified: Secondary | ICD-10-CM | POA: Insufficient documentation

## 2016-09-03 DIAGNOSIS — R32 Unspecified urinary incontinence: Secondary | ICD-10-CM | POA: Insufficient documentation

## 2016-09-03 DIAGNOSIS — K219 Gastro-esophageal reflux disease without esophagitis: Secondary | ICD-10-CM | POA: Insufficient documentation

## 2016-09-03 LAB — POCT URINALYSIS DIP (MANUAL ENTRY)
Glucose, UA: NEGATIVE
Leukocytes, UA: NEGATIVE
Nitrite, UA: NEGATIVE
SPEC GRAV UA: 1.015
Urobilinogen, UA: 4
pH, UA: 6

## 2016-09-03 MED ORDER — PANTOPRAZOLE SODIUM 40 MG PO TBEC: 40.0000 mg | DELAYED_RELEASE_TABLET | Freq: Two times a day (BID) | ORAL | 2 refills | Status: DC

## 2016-09-03 MED ORDER — NEOMYCIN-POLYMYXIN-HC 3.5-10000-1 OT SOLN
3.0000 [drp] | Freq: Four times a day (QID) | OTIC | 0 refills | Status: AC
Start: 2016-09-03 — End: ?

## 2016-09-03 MED ORDER — CHOLESTYRAMINE 4 G PO PACK: 4.0000 g | PACK | Freq: Three times a day (TID) | ORAL | 12 refills | Status: DC

## 2016-09-03 MED ORDER — SPIRONOLACTONE 50 MG PO TABS: 50.0000 mg | ORAL_TABLET | Freq: Every day | ORAL | 3 refills | Status: AC

## 2016-09-03 MED ORDER — FUROSEMIDE 20 MG PO TABS: 20.0000 mg | ORAL_TABLET | Freq: Two times a day (BID) | ORAL | 3 refills | Status: DC

## 2016-09-03 MED ORDER — MIRTAZAPINE 7.5 MG PO TABS: 7.5000 mg | ORAL_TABLET | Freq: Every day | ORAL | 2 refills | Status: DC

## 2016-09-03 MED ORDER — TRAMADOL HCL 50 MG PO TABS: 50.0000 mg | ORAL_TABLET | Freq: Three times a day (TID) | ORAL | 0 refills | Status: DC | PRN

## 2016-09-03 NOTE — Progress Notes (Signed)
Heidi Booker, is a 70 y.o. female  [id number]  [id number]  DOB - July 07, 1946  Chief Complaint  Patient presents with  . Follow-up      Subjective:   Heidi Booker is a 70 y.o. female with history of Hep C, liver cirrhosis and HCC accompanied by her two daughters  here today for a routine follow up visit.  Briefly: Heidi Booker was diagnosed with hep C and liver cirrhosis about two years ago in [place], but was not treated. She moved to [facility] with her daughters in mid 2015, routine ultrasound of abdomen on 04/13/2014 showed 2 subcentimeter lesions in the liver. She underwent abdominal MRI on 05/12/2014, which showed a 1.2 cm and 0.8 cm lesion in segment 8, concerning for hepatocellular carcinoma. On the follow-up MRI of the abdomen on 10/22/2014, both lesions increased in size to 1.6 and 1.2 cm, and the imaging characteristics were consistent with [place]. AFP was elevated at 11.8. She was referred to [facility] for treatment. She was deemed to be not a candidate for liver surgery or liver transplant, and she received TACE in July 2016. She has been followed by Dr. Aleatha Borer at [facility] due to the stable disease, and the uncompromised liver function, she has not received any additional treatment since then. Sorafenib was discussed, but not offered. Hepatitis C treatment was also discussed and offered to, but she did not start since she never received the antiviral medication.  She lives with her daughter, able to take care of herself and tolerates light activities. She has moderate fatigue, abdominal bloating, no significant pain, or nausea. She has no hematochezia or melena.   Major complaint today is ongoing itching from her liver cancer, she claims the cholestyramine helps but she ran out of medication. She needs refill of all other medications including that for depression. She has also recently noticed pain in her left ear, no discharge or hearing loss, only intense  internal pain. No fever, no headache. She is good with her ADLs. She continues to have moderate to low appetite. She sleeps relatively well. She has no significant abdominal pain or bloating, no nausea or vomiting.  ALLERGIES: Allergies  Allergen Reactions  . Other Swelling    octopus    PAST MEDICAL HISTORY: Past Medical History:  Diagnosis Date  . Arthritis 08/24/2014   Knees and ankles   . Cancer ([place])   . Depression 08/24/2014   Has a sister that is very sick and dealing with stress of this   . Hepatic cirrhosis ([place]) 01/21/2015   Lasix 20mg , nadolol to prevent varices from bleeding. From Hep C.  Xifaxin causesd severe abdominal pain. Was given to reduce confusion from cirrhosis.    . Hepatitis C   . Hepatitis C 01/21/2015   Under care Dr. Burnetta Sabin. Starting to treat hep c per patient.    . Hypertension   . Liver cancer ([place]) 07/05/2015  . Nephrolithiasis 08/24/2014  . Refusal of blood transfusions as patient is Jehov[employer]'s Witness   . Ulcer     MEDICATIONS AT HOME: Prior to Admission medications   Medication Sig Start Date End Date Taking? Authorizing Provider  cholestyramine (QUESTRAN) 4 g packet Take 1 packet (4 g total) by mouth 3 (three) times daily with meals. 09/03/16   Tresa Garter, MD  diclofenac (VOLTAREN) 75 MG EC tablet Take 1 tablet (75 mg total) by mouth 2 (two) times daily. 05/14/16   Tresa Garter, MD  furosemide (LASIX) 20 MG  tablet Take 1 tablet (20 mg total) by mouth 2 (two) times daily. 09/03/16   Tresa Garter, MD  Incontinence Supply Disposable (DEPEND UNDERWEAR X-LARGE) MISC Use as needed 04/09/16   Tresa Garter, MD  mirtazapine (REMERON) 7.5 MG tablet Take 1 tablet (7.5 mg total) by mouth at bedtime. 09/03/16   Tresa Garter, MD  Misc. Devices (ADJUSTABLE COMMODE 3-IN-1) MISC 1 each by Does not apply route as needed. 10/30/15   Tresa Garter, MD  Misc. Devices (TRANSFER BENCH) MISC 1 each by Does not apply route as needed.  10/30/15   Tresa Garter, MD  neomycin-polymyxin-hydrocortisone (CORTISPORIN) otic solution Place 3 drops into the left ear 4 (four) times daily. 09/03/16   Tresa Garter, MD  pantoprazole (PROTONIX) 40 MG tablet Take 1 tablet (40 mg total) by mouth 2 (two) times daily. 09/03/16   Tresa Garter, MD  spironolactone (ALDACTONE) 50 MG tablet Take 1 tablet (50 mg total) by mouth daily. 09/03/16   Tresa Garter, MD  traMADol (ULTRAM) 50 MG tablet Take 1 tablet (50 mg total) by mouth every 8 (eight) hours as needed. 09/03/16   Tresa Garter, MD  vitamin B-12 (CYANOCOBALAMIN) 1000 MCG tablet Take 1 tablet (1,000 mcg total) by mouth daily. 04/09/16   Tresa Garter, MD  Vitamin D, Ergocalciferol, (DRISDOL) 50000 units CAPS capsule Take 1 capsule (50,000 Units total) by mouth every 7 (seven) days. 04/09/16   Tresa Garter, MD   Objective:   Vitals:   09/03/16 1447  BP: 100/67  Pulse: 67  Resp: 18  Temp: 98.2 F (36.8 C)  TempSrc: Oral  SpO2: 98%  Weight: 158 lb 6.4 oz (71.8 kg)  Height: 4\' 9"  (1.448 m)    Exam General appearance : Awake, alert, not in any distress. Speech Clear. Not toxic looking HEENT: Atraumatic and Normocephalic, pupils equally reactive to light and accomodation. Old healed perforated left eardrum but no redness or discharge. Neck: Supple, no JVD. No cervical lymphadenopathy.  Chest: Good air entry bilaterally, no added sounds  CVS: S1 S2 regular, no murmurs.  Abdomen: Bowel sounds present, Non tender and not distended with no gaurding, rigidity or rebound. Extremities: B/L Lower Ext shows no edema, both legs are warm to touch Neurology: Awake alert, and oriented X 3, CN II-XII intact, Non focal Skin: No Rash   Data Review No results found for: HGBA1C  Assessment & Plan   1. Essential hypertension We have discussed target BP range and blood pressure goal. I have advised patient to check BP regularly and to call us back or report to  clinic if the numbers are consistently higher than 140/90. We discussed the importance of compliance with medical therapy and DASH diet recommended, consequences of uncontrolled hypertension discussed.  - continue current BP medications  2. Hepatocellular carcinoma (HCC)  - mirtazapine (REMERON) 7.5 MG tablet; Take 1 tablet (7.5 mg total) by mouth at bedtime.  Dispense: 30 tablet; Refill: 2  - Amb Referral to Palliative Care  3. Osteoarthrosis, localized, primary, knee, unspecified laterality - traMADol (ULTRAM) 50 MG tablet; Take 1 tablet (50 mg total) by mouth every 8 (eight) hours as needed.  Dispense: 60 tablet; Refill: 0  4. Itching  - cholestyramine (QUESTRAN) 4 g packet; Take 1 packet (4 g total) by mouth 3 (three) times daily with meals.  Dispense: 60 each; Refill: 12  5. Other cirrhosis of liver (HCC)  - furosemide (LASIX) 20 MG tablet; Take 1 tablet (  20 mg total) by mouth 2 (two) times daily.  Dispense: 180 tablet; Refill: 3 - spironolactone (ALDACTONE) 50 MG tablet; Take 1 tablet (50 mg total) by mouth daily.  Dispense: 90 tablet; Refill: 3  6. Gastroesophageal reflux disease without esophagitis  - pantoprazole (PROTONIX) 40 MG tablet; Take 1 tablet (40 mg total) by mouth 2 (two) times daily.  Dispense: 60 tablet; Refill: 2  7. Otalgia of left ear  - neomycin-polymyxin-hydrocortisone (CORTISPORIN) otic solution; Place 3 drops into the left ear 4 (four) times daily.  Dispense: 10 mL; Refill: 0  Patient have been counseled extensively about nutrition and exercise  Return in about 3 months (around 12/03/2016) for Follow up HTN, Follow up Pain and comorbidities, HCC, Urinary Incontinence.  Interpreter was used to communicate directly with patient for the entire encounter including providing detailed patient instructions.   The patient was given clear instructions to go to ER or return to medical center if symptoms don't improve, worsen or new problems develop. The patient  verbalized understanding. The patient was told to call to get lab results if they haven't heard anything in the next week.   This note has been created with Surveyor, quantity. Any transcriptional errors are unintentional.    [name], MD, [place], Karilyn Cota, [place] and [place], [place]   09/03/2016, 3:47 PM

## 2016-09-03 NOTE — Patient Instructions (Signed)
Hipertensin (Hypertension) La hipertensin, conocida comnmente como presin arterial alta, se produce cuando la sangre bombea en las arterias con mucha fuerza. Las arterias son los vasos sanguneos que transportan la sangre desde el corazn hacia todas las partes del cuerpo. Una lectura de la presin arterial consiste en un nmero ms alto sobre un nmero ms bajo, por ejemplo, 110/72. El nmero ms alto (presin sistlica) corresponde a la presin interna de las arterias cuando el corazn bombea sangre. El nmero ms bajo (presin diastlica) corresponde a la presin interna de las arterias cuando el corazn se relaja. En condiciones ideales, la presin arterial debe ser inferior a 120/80. La hipertensin fuerza al corazn a trabajar ms para bombear la sangre. Las arterias pueden estrecharse o ponerse rgidas. La hipertensin no tratada o no controlada puede causar infarto de miocardio, ictus, enfermedad renal y otros problemas. FACTORES DE RIESGO Algunos factores de riesgo de hipertensin son controlables, pero otros no lo son.  Entre los factores de riesgo que usted no puede controlar, se incluyen los siguientes:   La raza. El riesgo es mayor para las personas afroamericanas.  La edad. Los riesgos aumentan con la edad.  El sexo. Antes de los 45aos, los hombres corren ms riesgo que las mujeres. Despus de los 65aos, las mujeres corren ms riesgo que los hombres. Entre los factores de riesgo que usted puede controlar, se incluyen los siguientes:  No hacer la cantidad suficiente de actividad fsica o ejercicio.  Tener sobrepeso.  Consumir mucha grasa, azcar, caloras o sal en la dieta.  Beber alcohol en exceso. SIGNOS Y SNTOMAS Por lo general, la hipertensin no causa signos o sntomas. La hipertensin arterial demasiado alta (crisis hipertensiva) puede causar dolor de cabeza, ansiedad, falta de aire y hemorragia nasal. DIAGNSTICO Para detectar si usted tiene hipertensin, el  mdico le medir la presin arterial mientras est sentado, con el brazo levantado a la altura del corazn. Debe medirla al menos dos veces en el mismo brazo. Determinadas condiciones pueden causar una diferencia de presin arterial entre el brazo izquierdo y el derecho. El hecho de tener una sola lectura de la presin arterial ms alta que lo normal no significa que necesita un tratamiento. Si no est claro si tiene hipertensin arterial, es posible que se le pida que regrese otro da para volver a controlarle la presin arterial. O bien se le puede pedir que se controle la presin arterial en su casa durante 1 o ms meses. TRATAMIENTO El tratamiento de la hipertensin arterial incluye hacer cambios en el estilo de vida y, posiblemente, tomar medicamentos. Un estilo de vida saludable puede ayudar a bajar la presin arterial alta. Quiz deba cambiar algunos hbitos. Los cambios en el estilo de vida pueden incluir lo siguiente:  Seguir la dieta DASH. Esta dieta tiene un alto contenido de frutas, verduras y cereales integrales. Incluye poca cantidad de sal, carnes rojas y azcares agregados.  Mantenga el consumo de sodio por debajo de 2300 mg por da.  Realizar al menos entre 30 y 45 minutos de ejercicio aerbico, 4 veces por semana como mnimo.  Perder peso, si es necesario.  No fumar.  Limitar el consumo de bebidas alcohlicas.  Aprender formas de reducir el estrs. El mdico puede recetarle medicamentos si los cambios en el estilo de vida no son suficientes para lograr controlar la presin arterial y si una de las siguientes afirmaciones es verdadera:  Tiene entre 18 y 59 aos y su presin arterial sistlica est por encima de 140.  Tiene   60 aos o ms y su presin arterial sistlica est por encima de 150.  Su presin arterial diastlica est por encima de 90.  Tiene diabetes y su presin arterial sistlica est por encima de 140 o su presin arterial diastlica est por encima de  90.  Tiene una enfermedad renal y su presin arterial est por encima de 140/90.  Tiene una enfermedad cardaca y su presin arterial est por encima de 140/90. La presin arterial deseada puede variar en funcin de las enfermedades, la edad y otros factores personales. INSTRUCCIONES PARA EL CUIDADO EN EL HOGAR  Haga que le midan de nuevo la presin arterial segn las indicaciones del mdico.  Tome los medicamentos solamente como se lo haya indicado el mdico. Siga cuidadosamente las indicaciones. Los medicamentos para la presin arterial deben tomarse segn las indicaciones. Los medicamentos pierden eficacia al omitir las dosis. El hecho de omitir las dosis tambin aumenta el riesgo de otros problemas.  No fume.  Contrlese la presin arterial en su casa segn las indicaciones del mdico. SOLICITE ATENCIN MDICA SI:   Piensa que tiene una reaccin alrgica a los medicamentos.  Tiene mareos o dolores de cabeza con recurrencia.  Tiene hinchazn en los tobillos.  Tiene problemas de visin. SOLICITE ATENCIN MDICA DE INMEDIATO SI:  Siente un dolor de cabeza intenso o confusin.  Siente debilidad inusual, adormecimiento o que se desmayar.  Siente dolor intenso en el pecho o en el abdomen.  Vomita repetidas veces.  Tiene dificultad para respirar. ASEGRESE DE QUE:   Comprende estas instrucciones.  Controlar su afeccin.  Recibir ayuda de inmediato si no mejora o si empeora.   Esta informacin no tiene como fin reemplazar el consejo del mdico. Asegrese de hacerle al mdico cualquier pregunta que tenga.   Document Released: 11/30/2005 Document Revised: 04/16/2015 Elsevier Interactive Patient Education 2016 [employer]  

## 2016-09-03 NOTE — Progress Notes (Signed)
Patient is here for FU  Patient complains of lumbar pain being scaled at a 9.  Patient has taken medication today and patient has eaten today.

## 2016-09-14 NOTE — Telephone Encounter (Signed)
error 

## 2016-10-26 NOTE — Progress Notes (Signed)
[place]  Telephone:(336) [phone] Fax:(336) [phone]  Clinic follow up Note   Patient Care Team: Heidi Garter, MD as PCP - General (Internal Medicine) No Pcp Per Patient (General Practice) Heidi Shipper, MD as Consulting Physician (Gastroenterology) 10/27/2016   CHIEF COMPLAINTS:  Follow up [facility]  Oncology History   Hepatocellular carcinoma ([place])   Staging form: Liver (Excluding Intrahepatic Bile Ducts), AJCC 7th Edition     Clinical stage from 10/22/2014: Stage II (T2, N0, M0) - Signed by Heidi Merle, MD on 04/30/2016        Hepatocellular carcinoma ([place])   10/22/2014 Imaging    MRI ABD: Increased size of 1.6 cm hypervascular mass in lateral aspect of segment 8 and 1.2 cm hypervascular mass in dome of segment 8 consistent with [facility]      11/13/2014 Tumor Marker    AFP=11.8      07/04/2015 Procedure    TACE Right hepatic lobe [facility])      07/05/2015 Initial Diagnosis    Hepatocellular carcinoma ([place])      08/09/2015 Imaging    PET: 3.5 cm right hepatic lobe hypodense lesion w/central photopenia; cirrhosis and portal venous hypertension      08/29/2015 Imaging    MRI ABD: Persistent, but mildly decreased enhancement in hepatic segment #8 following TACE, suggestive of viable disease.Appears increased in size & could be due to developement of interval necrosis secondary to treatment; T12 osseous lesion      08/29/2015 Tumor Marker    AFP=12.7      11/27/2015 Imaging    MRI ABD: Persistent early enhancing lesions hepatic segment #8 w/washout following TACE;  A 3.4 cm segment 8 lesion has slightly increased; previously seen enhancing osseous lesion in T12 verterbral body not well visualized on current study.      05/20/2016 Imaging    Abdominal MRI w wo contrast showed 2 dominant right hepatic lobe lesions, measuring 3 in the 5 cm each, increased from prior scan. Multiple additional small 2 cm lesions (4) consistent with disease progression, liver cirrhosis  and significant portal hypertension.       HISTORY OF PRESENTING ILLNESS:  Heidi Booker 70 y.o. female with history of Hep C, liver cirrhosis and HCC, presents to our cancer center to transfer her oncological care to [place]. She is accompanied by her daughter and daughter-in-law to my clinic today.  She was diagnosed with hep C and liver cirrhosis about two years ago in [place], but was not treated. She moved to [facility] with her daughters in mid 2015, routine ultrasound of abdomen on 04/13/2014 showed 2 subcentimeter lesions in the liver. She underwent abdominal MRI on 05/12/2014, which showed a 1.2 cm and 0.8 cm lesion in segment 8, concerning for hepatocellular carcinoma. On the follow-up MRI of the abdomen on 10/22/2014, both lesions increased in size to 1.6 and 1.2 cm, and the imaging characteristic will consistent with [place]. AFP was elevated at 11.8. She was referred to [facility] for treatment. She was deemed to be not a candidate for liver surgery or liver transplant, and he received TACE in July 2016. She has been followed by Heidi Booker at [facility], due to the stable disease, and the compromised liver function, she has not received any additional treatment since then. Sorafenib was discussed, but not offered. Hepatitis C treatment was also discussed and offered to, but she did not start since she never received the antiviral medication.  She lives with her daughter, able to take  care of herself and tolerates light activities. She has moderate fatigue, abdominal bloating, no significant pain, or nausea. She has loose bowel movement 3-4 times a day, no hematochezia or melena.   CURRENT THERAPY: Supportive care  INTERIM HISTORY: Heidi Booker returns for follow-up. She is accompanied by two daughters to clinic today. She was last seen by me 3 months ago. She is not doing very well overall. She complains of skin itchiness, likely secondary to her jaundice. She is constipated, she takes  MiraLAX as needed use it. She does not sleep well, but takes naps during the day. Her appetite and energy level has decreased slightly. Her weight is stable. She is not physically active, able to walk for short distance, such as using bathroom and takes care of her ADLs, but not much other activities.  MEDICAL HISTORY:  Past Medical History:  Diagnosis Date  . Arthritis 08/24/2014   Knees and ankles   . Cancer ([place])   . Depression 08/24/2014   Has a sister that is very sick and dealing with stress of this   . Hepatic cirrhosis ([place]) 01/21/2015   Lasix 20mg , nadolol to prevent varices from bleeding. From Hep C.  Xifaxin causesd severe abdominal pain. Was given to reduce confusion from cirrhosis.    . Hepatitis C   . Hepatitis C 01/21/2015   Under care Dr. Burnetta Booker. Starting to treat hep c per patient.    . Hypertension   . Liver cancer ([place]) 07/05/2015  . Nephrolithiasis 08/24/2014  . Refusal of blood transfusions as patient is Jehovah's Witness   . Ulcer ([place])     SURGICAL HISTORY: Past Surgical History:  Procedure Laterality Date  . ABDOMINAL HYSTERECTOMY    . ESOPHAGOGASTRODUODENOSCOPY N/A 05/09/2014   Procedure: ESOPHAGOGASTRODUODENOSCOPY (EGD);  Surgeon: Heidi Shipper, MD;  Location: [facility] ENDOSCOPY;  Service: Endoscopy;  Laterality: N/A;  . KNEE ARTHROSCOPY Left   . KNEE SURGERY     "removed something"  . PARTIAL HYSTERECTOMY    . TONSILECTOMY/ADENOIDECTOMY WITH MYRINGOTOMY      SOCIAL HISTORY: Social History   Social History  . Marital status: Widowed    Spouse name: N/A  . Number of children: 1  . Years of education: N/A   Occupational History  . Not on file.   Social History Main Topics  . Smoking status: Never Smoker  . Smokeless tobacco: Never Used  . Alcohol use No     Comment: quit 24 years ago  . Drug use: No  . Sexual activity: No   Other Topics Concern  . Not on file   Social History Narrative   ** Merged History Encounter **   Widowed   Lives with  son, 3 daughters. 4 children (lost 3 boys in childbirth). 3 grandkids.    Originally from [place]   Has sister who is very ill      Retired, stays at home.       Hobbies: stays at home    FAMILY HISTORY: Family History  Problem Relation Age of Onset  . Pulmonary fibrosis Sister   . Kidney disease Sister   . Colon cancer Neg Hx     ALLERGIES:  is allergic to other.  MEDICATIONS:  Current Outpatient Prescriptions  Medication Sig Dispense Refill  . cholestyramine (QUESTRAN) 4 g packet Take 1 packet (4 g total) by mouth 3 (three) times daily with meals. (Patient taking differently: Take 4 g by mouth 3 (three) times daily with meals. Only taking bid-states  constipates) 60 each 12  . furosemide (LASIX) 20 MG tablet Take 1 tablet (20 mg total) by mouth 2 (two) times daily. 180 tablet 3  . neomycin-polymyxin-hydrocortisone (CORTISPORIN) otic solution Place 3 drops into the left ear 4 (four) times daily. 10 mL 0  . pantoprazole (PROTONIX) 40 MG tablet Take 1 tablet (40 mg total) by mouth 2 (two) times daily. 60 tablet 2  . spironolactone (ALDACTONE) 50 MG tablet Take 1 tablet (50 mg total) by mouth daily. 90 tablet 3  . traMADol (ULTRAM) 50 MG tablet Take 1 tablet (50 mg total) by mouth every 8 (eight) hours as needed. 60 tablet 0  . Incontinence Supply Disposable (DEPEND UNDERWEAR X-LARGE) MISC Use as needed 26 each 11  . lactulose (CHRONULAC) 10 GM/15ML solution Take 15 mLs (10 g total) by mouth 2 (two) times daily as needed for mild constipation. 240 mL 1  . Misc. Devices (ADJUSTABLE COMMODE 3-IN-1) MISC 1 each by Does not apply route as needed. 1 each 0  . Misc. Devices (TRANSFER BENCH) MISC 1 each by Does not apply route as needed. 1 each 0  . vitamin B-12 (CYANOCOBALAMIN) 1000 MCG tablet Take 1 tablet (1,000 mcg total) by mouth daily. (Patient not taking: Reported on 10/27/2016) 90 tablet 3  . Vitamin D, Ergocalciferol, (DRISDOL) 50000 units CAPS capsule Take 1 capsule (50,000 Units  total) by mouth every 7 (seven) days. (Patient not taking: Reported on 10/27/2016) 30 capsule 0   No current facility-administered medications for this visit.     REVIEW OF SYSTEMS:   Constitutional: Denies fevers, chills or abnormal night sweats, (+) fatigue Eyes: Denies blurriness of vision, double vision or watery eyes Ears, nose, mouth, throat, and face: Denies mucositis or sore throat Respiratory: Denies cough, dyspnea or wheezes Cardiovascular: Denies palpitation, chest discomfort or lower extremity swelling Gastrointestinal:  Denies nausea, heartburn or change in bowel habits Skin: Denies abnormal skin rashes, (+) skin itchiness Lymphatics: Denies new lymphadenopathy or easy bruising Neurological:Denies numbness, tingling or new weaknesses Behavioral/Psych: Mood is stable, no new changes  All other systems were reviewed with the patient and are negative.  PHYSICAL EXAMINATION: ECOG PERFORMANCE STATUS: 3  Vitals:   10/27/16 1442  BP: (!) 131/50  Pulse: (!) 59  Resp: 18  Temp: 98.2 F (36.8 C)   Filed Weights   10/27/16 1442  Weight: 157 lb 11.2 oz (71.5 kg)    GENERAL:alert, no distress and comfortable SKIN: skin color, texture, turgor are normal, no rashes or significant lesions, (+) jaundice EYES: normal, conjunctiva are pink and non-injected, sclera Slightly jaundice OROPHARYNX:no exudate, no erythema and lips, buccal mucosa, and tongue normal  NECK: supple, thyroid normal size, non-tender, without nodularity LYMPH:  no palpable lymphadenopathy in the cervical, axillary or inguinal LUNGS: clear to auscultation and percussion with normal breathing effort HEART: regular rate & rhythm and no murmurs and no lower extremity edema ABDOMEN:abdomen soft, non-tender and normal bowel sounds Musculoskeletal:no cyanosis of digits and no clubbing  PSYCH: alert & oriented x 3 with fluent speech NEURO: no focal motor/sensory deficits  LABORATORY DATA:  I have reviewed the  data as listed  CBC Latest Ref Rng & Units 10/27/2016 07/27/2016 05/25/2016  WBC 3.9 - 10.3 10e3/uL 6.2 5.5 5.5  Hemoglobin 11.6 - 15.9 g/dL 11.3(L) 12.0 12.7  Hematocrit 34.8 - 46.6 % 33.2(L) 35.6 39.7  Platelets 145 - 400 10e3/uL 53(L) 63 Large & giant platelets(L) 53(L)    CMP Latest Ref Rng & Units 10/27/2016 08/10/2016 07/27/2016  Glucose 70 - 140 mg/dl 156(H) - 166(H)  BUN 7.0 - 26.0 mg/dL 14.4 - 15.3  Creatinine 0.6 - 1.1 mg/dL 0.8 - 1.0  Sodium 136 - 145 mEq/L 138 - 138  Potassium 3.5 - 5.1 mEq/L 4.0 - 4.2  Chloride 98 - 110 mmol/L - - -  CO2 22 - 29 mEq/L 25 - 26  Calcium 8.4 - 10.4 mg/dL 8.1(L) - 8.4  Total Protein 6.4 - 8.3 g/dL 6.6 - 6.5  Total Bilirubin 0.20 - 1.20 mg/dL 4.48(HH) - 3.67(HH)  Alkaline Phos 40 - 150 U/L 251(H) - 178(H)  AST 5 - 34 U/L 124(H) - 137(H)  ALT 0 - 55 U/L 57(H) 57(H) 73(H)   Results for LESLEI, SUMBRY (MRN [id number]) as of 08/09/2016 14:12  Ref. Range 05/10/2014 16:15 05/05/2016 14:44 07/27/2016 14:34  AFP Tumor Marker Latest Ref Range: 0.0 - 8.0 ng/mL 7.5    AFP, Serum, Tumor Marker Latest Ref Range: 0.0 - 8.3 ng/mL  28.1 (H) 74.9 (H)    RADIOGRAPHIC STUDIES: I have personally reviewed the radiological images as listed and agreed with the findings in the report.  Abdominal MR w wo contrast 05/20/2016 IMPRESSION: 1. Two dominant hepatomas in the RIGHT hepatic lobe measuring approximately 3 and 5 cm each are increased from comparison MRI 2. Multiple additional small 2 cm lesions (approximately 4) consistent with progression of smaller hepatomas. 3. Extensive venous collateralization along the gastric cardia and recanalization of the umbilical consistent with significant portal hypertension. 4. Morphologic change consistent with cirrhosis. No ascites.   ASSESSMENT & PLAN: 70 year old Spanish speaking female, with untreated hepatitis C, liver cirrhosis and hepatocellular carcinoma.  1. Hepatocellular carcinoma, multifocal, cT2N0M0, s/p  TACE 06/2015 -I reviewed and discussed all her previous images with patient and her daughters. -Based on the imaging findings, her significant liver disease and liver cirrhosis, I agree with the diagnosis of [place]. -She was evaluated at [facility], and felt not to be a candidate for liver resection or transplant. She has received TACE once in July 2016. -Due to her liver cirrhosis and decompensated liver function (child Pugh B), she is not a good candidate for further liver targeted therapy and sorafenib. -I reviewed her restaging abdominal MRI scan from 05/20/2016, which showed significant disease progression of the prior 2 liver masses, and the multiple new liver lesions.  -Her AFP has significantly increased, consistent with disease progression. -Unfortunately she is also not a candidate for hepatitis C treatment. -I discussed her imaging findings and lab results with interventional radiologist Dr. Laurence Ferrari who does not think she is a candidate for Y90 or second TACE due to her decompensated liver. -She declined systemic therapy Nivolumab -Her jaundice has gotten worse, and she is symptomatic, I'll order an abdominal ultrasound to see if there is biliary dilatation and obstruction -Her condition has gradually getting worse, her performance status also declined, I recommended hospice, patient and her daughters are more receptive now, we'll think about it.  2. Hep C, liver cirrhosis -She was seen by [facility] as the liver clinic, hepatitis C treatment was not offered -She will follow-up with [facility] for her liver cirrhosis management  3. Constipation -I strongly encouraged her to avoid constipation, due to the concern of hepatic encephalopathy -I give her a prescription of lactulose, I recommend her to use it as needed to keep soft bowel movement 2-3 times a day  Plan -abdominal US for her worsening jaundice, to ruled out biliary obstruction -Continue supportive care, we discussed hospice, patient will think  about it -I give her a prescription of lactulose today  -I will see her as needed if she enrolls to hospice. -I will call her after her abd [place]   All questions were answered. The patient knows to call the clinic with any problems, questions or concerns. I spent 25 minutes counseling the patient face to face. The total time spent in the appointment was 30 minutes and more than 50% was on counseling.     Heidi Merle, MD 10/27/2016

## 2016-10-27 ENCOUNTER — Encounter: Payer: Self-pay | Admitting: Hematology

## 2016-10-27 ENCOUNTER — Other Ambulatory Visit (HOSPITAL_BASED_OUTPATIENT_CLINIC_OR_DEPARTMENT_OTHER): Payer: Medicare Other

## 2016-10-27 ENCOUNTER — Ambulatory Visit (HOSPITAL_BASED_OUTPATIENT_CLINIC_OR_DEPARTMENT_OTHER): Payer: Medicare Other | Admitting: Hematology

## 2016-10-27 ENCOUNTER — Other Ambulatory Visit: Payer: Self-pay | Admitting: *Deleted

## 2016-10-27 VITALS — BP 131/50 | HR 59 | Temp 98.2°F | Resp 18 | Ht <= 58 in | Wt 157.7 lb

## 2016-10-27 DIAGNOSIS — C220 Liver cell carcinoma: Secondary | ICD-10-CM

## 2016-10-27 DIAGNOSIS — B1920 Unspecified viral hepatitis C without hepatic coma: Secondary | ICD-10-CM

## 2016-10-27 DIAGNOSIS — K7469 Other cirrhosis of liver: Secondary | ICD-10-CM

## 2016-10-27 DIAGNOSIS — K5900 Constipation, unspecified: Secondary | ICD-10-CM | POA: Diagnosis not present

## 2016-10-27 DIAGNOSIS — B182 Chronic viral hepatitis C: Secondary | ICD-10-CM

## 2016-10-27 LAB — CBC WITH DIFFERENTIAL/PLATELET
BASO%: 0.6 % (ref 0.0–2.0)
BASOS ABS: 0 10*3/uL (ref 0.0–0.1)
EOS%: 3.4 % (ref 0.0–7.0)
Eosinophils Absolute: 0.2 10*3/uL (ref 0.0–0.5)
HCT: 33.2 % — ABNORMAL LOW (ref 34.8–46.6)
HEMOGLOBIN: 11.3 g/dL — AB (ref 11.6–15.9)
LYMPH#: 2.1 10*3/uL (ref 0.9–3.3)
LYMPH%: 33.8 % (ref 14.0–49.7)
MCH: 28.7 pg (ref 25.1–34.0)
MCHC: 34 g/dL (ref 31.5–36.0)
MCV: 84.3 fL (ref 79.5–101.0)
MONO#: 1.2 10*3/uL — ABNORMAL HIGH (ref 0.1–0.9)
MONO%: 18.8 % — AB (ref 0.0–14.0)
NEUT#: 2.7 10*3/uL (ref 1.5–6.5)
NEUT%: 43.4 % (ref 38.4–76.8)
Platelets: 53 10*3/uL — ABNORMAL LOW (ref 145–400)
RBC: 3.94 10*6/uL (ref 3.70–5.45)
RDW: 18.1 % — ABNORMAL HIGH (ref 11.2–14.5)
WBC: 6.2 10*3/uL (ref 3.9–10.3)
nRBC: 0 % (ref 0–0)

## 2016-10-27 LAB — COMPREHENSIVE METABOLIC PANEL
ALK PHOS: 251 U/L — AB (ref 40–150)
ALT: 57 U/L — ABNORMAL HIGH (ref 0–55)
AST: 124 U/L — ABNORMAL HIGH (ref 5–34)
Anion Gap: 5 mEq/L (ref 3–11)
BUN: 14.4 mg/dL (ref 7.0–26.0)
CHLORIDE: 108 meq/L (ref 98–109)
CO2: 25 mEq/L (ref 22–29)
Calcium: 8.1 mg/dL — ABNORMAL LOW (ref 8.4–10.4)
Creatinine: 0.8 mg/dL (ref 0.6–1.1)
EGFR: 76 mL/min/{1.73_m2} — AB (ref >90)
GLUCOSE: 156 mg/dL — AB (ref 70–140)
POTASSIUM: 4 meq/L (ref 3.5–5.1)
SODIUM: 138 meq/L (ref 136–145)
Total Bilirubin: 4.48 mg/dL (ref 0.20–1.20)
Total Protein: 6.6 g/dL (ref 6.4–8.3)

## 2016-10-27 LAB — PROTIME-INR
INR: 1.5 — ABNORMAL LOW (ref 2.00–3.50)
PROTIME: 18 s — AB (ref 10.6–13.4)

## 2016-10-27 MED ORDER — LACTULOSE 10 GM/15ML PO SOLN: 10.0000 g | Freq: Two times a day (BID) | ORAL | 1 refills | Status: AC | PRN

## 2016-10-28 LAB — AFP TUMOR MARKER: AFP, SERUM, TUMOR MARKER: 504.1 ng/mL — AB (ref 0.0–8.3)

## 2016-11-10 ENCOUNTER — Ambulatory Visit (HOSPITAL_COMMUNITY)
Admission: RE | Admit: 2016-11-10 | Discharge: 2016-11-10 | Disposition: A | Discharge status: Home / Self Care | Payer: Medicare Other | Source: Ambulatory Visit | Attending: Hematology | Admitting: Hematology

## 2016-11-10 DIAGNOSIS — C220 Liver cell carcinoma: Secondary | ICD-10-CM | POA: Diagnosis not present

## 2016-11-10 DIAGNOSIS — K8020 Calculus of gallbladder without cholecystitis without obstruction: Secondary | ICD-10-CM | POA: Insufficient documentation

## 2016-11-24 ENCOUNTER — Telehealth: Payer: Self-pay | Admitting: *Deleted

## 2016-11-24 NOTE — Telephone Encounter (Signed)
Received call yesterday from pt's daughter requesting results of U/S.  Returned call & pt ask for copy of result to be mailed to her.  She states pt isn't ready for Hospice yet.  Copy of U/S to be mailed per Dr Burr Medico.

## 2016-12-03 ENCOUNTER — Encounter: Payer: Self-pay | Admitting: Internal Medicine

## 2016-12-03 ENCOUNTER — Ambulatory Visit: Payer: Medicare Other | Attending: Internal Medicine | Admitting: Internal Medicine

## 2016-12-03 VITALS — BP 126/71 | HR 64 | Temp 100.4°F | Resp 18 | Ht 59.0 in | Wt 157.0 lb

## 2016-12-03 DIAGNOSIS — I10 Essential (primary) hypertension: Secondary | ICD-10-CM | POA: Insufficient documentation

## 2016-12-03 DIAGNOSIS — Z515 Encounter for palliative care: Secondary | ICD-10-CM | POA: Insufficient documentation

## 2016-12-03 DIAGNOSIS — J069 Acute upper respiratory infection, unspecified: Secondary | ICD-10-CM | POA: Diagnosis not present

## 2016-12-03 DIAGNOSIS — C220 Liver cell carcinoma: Secondary | ICD-10-CM | POA: Diagnosis not present

## 2016-12-03 DIAGNOSIS — K7469 Other cirrhosis of liver: Secondary | ICD-10-CM

## 2016-12-03 DIAGNOSIS — K219 Gastro-esophageal reflux disease without esophagitis: Secondary | ICD-10-CM | POA: Insufficient documentation

## 2016-12-03 DIAGNOSIS — L299 Pruritus, unspecified: Secondary | ICD-10-CM

## 2016-12-03 DIAGNOSIS — B1920 Unspecified viral hepatitis C without hepatic coma: Secondary | ICD-10-CM | POA: Insufficient documentation

## 2016-12-03 DIAGNOSIS — K7460 Unspecified cirrhosis of liver: Secondary | ICD-10-CM | POA: Diagnosis not present

## 2016-12-03 DIAGNOSIS — F329 Major depressive disorder, single episode, unspecified: Secondary | ICD-10-CM | POA: Insufficient documentation

## 2016-12-03 DIAGNOSIS — E119 Type 2 diabetes mellitus without complications: Secondary | ICD-10-CM | POA: Insufficient documentation

## 2016-12-03 LAB — GLUCOSE, POCT (MANUAL RESULT ENTRY): POC Glucose: 162 mg/dl — AB (ref 70–99)

## 2016-12-03 MED ORDER — PANTOPRAZOLE SODIUM 40 MG PO TBEC: 40.0000 mg | DELAYED_RELEASE_TABLET | Freq: Two times a day (BID) | ORAL | 3 refills | Status: AC

## 2016-12-03 MED ORDER — AZITHROMYCIN 250 MG PO TABS: ORAL_TABLET | ORAL | 0 refills | Status: AC

## 2016-12-03 MED ORDER — FUROSEMIDE 20 MG PO TABS: 20.0000 mg | ORAL_TABLET | Freq: Two times a day (BID) | ORAL | 3 refills | Status: AC

## 2016-12-03 MED ORDER — VITAMIN D (ERGOCALCIFEROL) 1.25 MG (50000 UNIT) PO CAPS: 50000.0000 [IU] | ORAL_CAPSULE | ORAL | 2 refills | Status: AC

## 2016-12-03 MED ORDER — LORATADINE 10 MG PO TABS: 10.0000 mg | ORAL_TABLET | Freq: Every day | ORAL | 1 refills | Status: AC

## 2016-12-03 MED ORDER — CHOLESTYRAMINE 4 G PO PACK: 8.0000 g | PACK | Freq: Two times a day (BID) | ORAL | 12 refills | Status: AC

## 2016-12-03 MED ORDER — TRAMADOL HCL 50 MG PO TABS: 50.0000 mg | ORAL_TABLET | Freq: Three times a day (TID) | ORAL | 0 refills | Status: AC | PRN

## 2016-12-03 MED ORDER — VITAMIN B-12 1000 MCG PO TABS: 1000.0000 ug | ORAL_TABLET | Freq: Every day | ORAL | 3 refills | Status: AC

## 2016-12-03 NOTE — Progress Notes (Signed)
Heidi Booker, is a 70 y.o. female  [id number]  [id number]  DOB - [birth year]  Chief Complaint  Patient presents with  . Hypertension       Subjective:   Heidi Booker is a 70 y.o. female with history of Hep C, liver cirrhosis and HCC accompanied by her two daughters here today for a follow up visit. Her complaint today is sinus pressure, fever and pain around the sinuses. She also has cough. She denies any sick contact. She lives with her daughter, able to take care of herself and tolerates light activities. She has moderate fatigue, abdominal bloating, no significant pain, or nausea. She has no hematochezia or melena. She still has itching and current medication is not helping as desired. She sleeps well, she eats well. She does not have any swelling. She is good with her ADLs. She continues to have moderate to low appetite. She sleeps relatively well. She has no significant abdominal pain or bloating, no nausea or vomiting.   Briefly: Heidi Booker was diagnosed with hep C and liver cirrhosis about two years ago in [place], but was not treated. She moved to [facility] with her daughters in mid 2015, routine ultrasound of abdomen on 04/13/2014 showed 2 subcentimeter lesions in the liver. She underwent abdominal MRI on 05/12/2014, which showed a 1.2 cm and 0.8 cm lesion in segment 8, concerning for hepatocellular carcinoma. On the follow-up MRI of the abdomen on 10/22/2014, both lesions increased in size to 1.6 and 1.2 cm, and the imaging characteristics were consistent with [place]. AFP was elevated at 11.8. She was referred to [facility] for treatment. She was deemed to be not a candidate for liver surgery or liver transplant, and she received TACE in July 2016. She has been followed by Dr. Aleatha Borer at [facility], due to the stable disease, and the uncompromised liver function, she has not received any additional treatment since then. Sorafenib was discussed, but not offered. Hepatitis  C treatment was also discussed and offered to, but she did not start since she never received the antiviral medication.   Problem  Acute Upper Respiratory Infection  Controlled Type 2 Diabetes Mellitus Without Complication, Without Long-Term Current Use of Insulin (Hcc)  Hospice Care    ALLERGIES: Allergies  Allergen Reactions  . Other Swelling    octopus    PAST MEDICAL HISTORY: Past Medical History:  Diagnosis Date  . Arthritis 08/24/2014   Knees and ankles   . Cancer ([place])   . Depression 08/24/2014   Has a sister that is very sick and dealing with stress of this   . Hepatic cirrhosis ([place]) 01/21/2015   Lasix 20mg , nadolol to prevent varices from bleeding. From Hep C.  Xifaxin causesd severe abdominal pain. Was given to reduce confusion from cirrhosis.    . Hepatitis C   . Hepatitis C 01/21/2015   Under care Dr. Burnetta Sabin. Starting to treat hep c per patient.    . Hypertension   . Liver cancer ([place]) 07/05/2015  . Nephrolithiasis 08/24/2014  . Refusal of blood transfusions as patient is Jehovah's Witness   . Ulcer ([place])     MEDICATIONS AT HOME: Prior to Admission medications   Medication Sig Start Date End Date Taking? Authorizing Provider  cholestyramine (QUESTRAN) 4 g packet Take 2 packets (8 g total) by mouth 2 (two) times daily. 12/03/16  Yes Tresa Garter, MD  furosemide (LASIX) 20 MG tablet Take 1 tablet (20 mg total) by mouth 2 (two) times  daily. 12/03/16  Yes Tresa Garter, MD  Incontinence Supply Disposable (DEPEND UNDERWEAR X-LARGE) MISC Use as needed 04/09/16  Yes Mohamed Portlock E Doreene Burke, MD  lactulose (CHRONULAC) 10 GM/15ML solution Take 15 mLs (10 g total) by mouth 2 (two) times daily as needed for mild constipation. 10/27/16  Yes Truitt Merle, MD  Misc. Devices (ADJUSTABLE COMMODE 3-IN-1) MISC 1 each by Does not apply route as needed. 10/30/15  Yes Tresa Garter, MD  Misc. Devices (TRANSFER BENCH) MISC 1 each by Does not apply route as needed. 10/30/15  Yes  Tresa Garter, MD  neomycin-polymyxin-hydrocortisone (CORTISPORIN) otic solution Place 3 drops into the left ear 4 (four) times daily. 09/03/16  Yes Tresa Garter, MD  pantoprazole (PROTONIX) 40 MG tablet Take 1 tablet (40 mg total) by mouth 2 (two) times daily. 12/03/16  Yes Tresa Garter, MD  spironolactone (ALDACTONE) 50 MG tablet Take 1 tablet (50 mg total) by mouth daily. 09/03/16  Yes Kennedey Digilio Essie Christine, MD  traMADol (ULTRAM) 50 MG tablet Take 1 tablet (50 mg total) by mouth every 8 (eight) hours as needed. 12/03/16  Yes Tresa Garter, MD  vitamin B-12 (CYANOCOBALAMIN) 1000 MCG tablet Take 1 tablet (1,000 mcg total) by mouth daily. 12/03/16  Yes Tresa Garter, MD  azithromycin (ZITHROMAX) 250 MG tablet Take 2 tablets today then 1 tablet daily for 4 days starting tomorrow 12/03/16   Tresa Garter, MD  loratadine (CLARITIN) 10 MG tablet Take 1 tablet (10 mg total) by mouth daily. 12/03/16   Tresa Garter, MD  Vitamin D, Ergocalciferol, (DRISDOL) 50000 units CAPS capsule Take 1 capsule (50,000 Units total) by mouth every 7 (seven) days. 12/03/16   Tresa Garter, MD    Objective:   Vitals:   12/03/16 1548  BP: 126/71  Pulse: 64  Resp: 18  Temp: (!) 100.4 F (38 C)  TempSrc: Oral  SpO2: 100%  Weight: 157 lb (71.2 kg)  Height: 4\' 11"  (1.499 m)   Exam General appearance : Awake, alert, not in any distress. Speech Clear. Not toxic looking HEENT: Atraumatic and Normocephalic, pupils equally reactive to light and accomodation Neck: Supple, no JVD. No cervical lymphadenopathy.  Chest: Good air entry bilaterally, no added sounds  CVS: S1 S2 regular, no murmurs.  Abdomen: Bowel sounds present, Non tender and not distended with no gaurding, rigidity or rebound. Extremities: B/L Lower Ext shows no edema, both legs are warm to touch Neurology: Awake alert, and oriented X 3, CN II-XII intact, Non focal Skin: No Rash  Data Review No results  found for: HGBA1C  Assessment & Plan   1. Controlled type 2 diabetes mellitus without complication, without long-term current use of insulin (HCC)  - Glucose (CBG)  2. Essential hypertension  We have discussed target BP range and blood pressure goal. I have advised patient to check BP regularly and to call us back or report to clinic if the numbers are consistently higher than 140/90. We discussed the importance of compliance with medical therapy and DASH diet recommended, consequences of uncontrolled hypertension discussed.  - continue current BP medications  3. Hepatocellular carcinoma (HCC) Refill - Vitamin D, Ergocalciferol, (DRISDOL) 50000 units CAPS capsule; Take 1 capsule (50,000 Units total) by mouth every 7 (seven) days.  Dispense: 12 capsule; Refill: 2 - traMADol (ULTRAM) 50 MG tablet; Take 1 tablet (50 mg total) by mouth every 8 (eight) hours as needed.  Dispense: 60 tablet; Refill: 0  4. Acute upper respiratory infection  Prescribed - azithromycin (ZITHROMAX) 250 MG tablet; Take 2 tablets today then 1 tablet daily for 4 days starting tomorrow  Dispense: 6 tablet; Refill: 0 - loratadine (CLARITIN) 10 MG tablet; Take 1 tablet (10 mg total) by mouth daily.  Dispense: 30 tablet; Refill: 1  5. Other cirrhosis of liver (HCC) Refill  - vitamin B-12 (CYANOCOBALAMIN) 1000 MCG tablet; Take 1 tablet (1,000 mcg total) by mouth daily.  Dispense: 90 tablet; Refill: 3 - furosemide (LASIX) 20 MG tablet; Take 1 tablet (20 mg total) by mouth 2 (two) times daily.  Dispense: 180 tablet; Refill: 3  6. Gastroesophageal reflux disease without esophagitis  - pantoprazole (PROTONIX) 40 MG tablet; Take 1 tablet (40 mg total) by mouth 2 (two) times daily.  Dispense: 180 tablet; Refill: 3  7. Itching - Increase Cholestyramine to 8 mg BID - cholestyramine (QUESTRAN) 4 g packet; Take 2 packets (8 g total) by mouth 2 (two) times daily.  Dispense: 120 each; Refill: 12  Return in about 3 months (around  03/03/2017) for Follow up Pain and comorbidities, Abdominal Pain.  The patient was given clear instructions to go to ER or return to medical center if symptoms don't improve, worsen or new problems develop. The patient verbalized understanding. The patient was told to call to get lab results if they haven't heard anything in the next week.   This note has been created with Surveyor, quantity. Any transcriptional errors are unintentional.    [name], MD, [place], Karilyn Cota, [place] and [place], [place]   12/03/2016, 4:47 PM

## 2016-12-03 NOTE — Progress Notes (Signed)
Patient is here for Follow up HTN.  Patient also stated she be having some horrible  Anterior Headaches, her pain scale is a 9 .  She also has aching in her whole body that comes and goes.  She also stated she has dry skin and she been having some nose bleed.

## 2016-12-03 NOTE — Patient Instructions (Signed)
Infeccin del tracto respiratorio superior, adultos (Upper Respiratory Infection, Adult) La mayora de las infecciones del tracto respiratorio superior son infecciones virales de las vas que llevan el aire a los pulmones. Un infeccin del tracto respiratorio superior afecta la nariz, la garganta y las vas respiratorias superiores. El tipo ms frecuente de infeccin del tracto respiratorio superior es la nasofaringitis, que habitualmente se conoce como "resfro comn". Las infecciones del tracto respiratorio superior siguen su curso y por lo general se curan solas. En la mayora de los casos, la infeccin del tracto respiratorio superior no requiere atencin mdica, pero a veces, despus de una infeccin viral, puede surgir una infeccin bacteriana en las vas respiratorias superiores. Esto se conoce como infeccin secundaria. Las infecciones sinusales y en el odo medio son tipos frecuentes de infecciones secundarias en el tracto respiratorio superior. La neumona bacteriana tambin puede complicar un cuadro de infeccin del tracto respiratorio superior. Este tipo de infeccin puede empeorar el asma y la enfermedad pulmonar obstructiva crnica (EPOC). En algunos casos, estas complicaciones pueden requerir atencin mdica de emergencia y poner en peligro la vida. CAUSAS Casi todas las infecciones del tracto respiratorio superior se deben a los virus. Un virus es un tipo de microbio que puede contagiarse de una persona a otra. FACTORES DE RIESGO Puede estar en riesgo de sufrir una infeccin del tracto respiratorio superior si:  Fuma.  Tiene una enfermedad pulmonar o cardaca crnica.  Tiene debilitado el sistema de defensa (inmunitario) del cuerpo.  Es muy joven o de edad muy avanzada.  Tiene asma o alergias nasales.  Trabaja en reas donde hay mucha gente o poca ventilacin.  Trabaja en una escuela o en un centro de atencin mdica. SIGNOS Y SNTOMAS Habitualmente, los sntomas aparecen de  2a 3das despus de entrar en contacto con el virus del resfro. La mayora de las infecciones virales en el tracto respiratorio superior duran de 7a 10das. Sin embargo, las infecciones virales en el tracto respiratorio superior a causa del virus de la gripe pueden durar de 14a 18das y, habitualmente, son ms graves. Entre los sntomas se pueden incluir los siguientes:  Secrecin o congestin nasal.  Estornudos.  Tos.  Dolor de garganta.  Dolor de cabeza.  Fatiga.  Fiebre.  Prdida del apetito.  Dolor en la frente, detrs de los ojos y por encima de los pmulos (dolor sinusal).  Dolores musculares. DIAGNSTICO El mdico puede diagnosticar una infeccin del tracto respiratorio superior mediante los siguientes estudios:  Examen fsico.  Pruebas para verificar si los sntomas no se deben a otra afeccin, por ejemplo:  Faringitis estreptoccica.  Sinusitis.  Neumona.  Asma. TRATAMIENTO Esta infeccin desaparece sola, con el tiempo. No puede curarse con medicamentos, pero a menudo se prescriben para aliviar los sntomas. Los medicamentos pueden ser tiles para lo siguiente:  Bajar la fiebre.  Reducir la tos.  Aliviar la congestin nasal. INSTRUCCIONES PARA EL CUIDADO EN EL HOGAR  Tome los medicamentos solamente como se lo haya indicado el mdico.  A fin de aliviar el dolor de garganta, haga grgaras con solucin salina templada o consuma caramelos para la tos, como se lo haya indicado el mdico.  Use un humidificador de vapor clido o inhale el vapor de la ducha para aumentar la humedad del aire. Esto facilitar la respiracin.  Beba suficiente lquido para mantener la orina clara o de color amarillo plido.  Consuma sopas y otros caldos transparentes, y alimntese bien.  Descanse todo lo que sea necesario.  Regrese al trabajo cuando   la temperatura se le haya normalizado o cuando el mdico lo autorice. Es posible que deba quedarse en su casa durante un  tiempo prolongado, para no infectar a los dems. [place] usar un barbijo y lavarse las manos con cuidado para Mining engineer propagacin del virus.  Aumente el uso del inhalador si tiene asma.  No consuma ningn producto que contenga tabaco, lo que incluye cigarrillos, tabaco de Higher education careers adviser o Psychologist, sport and exercise. Si necesita ayuda para dejar de fumar, consulte al [employer]. PREVENCIN La mejor manera de protegerse de un resfro es mantener una higiene [place].  Evite el contacto oral o fsico con personas que tengan sntomas de resfro.  En caso de contacto, lvese las manos con frecuencia. No hay pruebas claras de que la vitaminaC, la vitaminaE, la equincea o el ejercicio reduzcan la probabilidad de Museum/gallery curator un resfro. Sin embargo, siempre se recomienda Scientific laboratory technician, hacer ejercicio y Ecologist. SOLICITE ATENCIN MDICA SI:  Su estado empeora en lugar de mejorar.  Los medicamentos no Animator.  Tiene escalofros.  La sensacin de falta de aire empeora.  Tiene mucosidad marrn o roja.  Tiene secrecin nasal amarilla o marrn.  Le duele la cara, especialmente al inclinarse hacia adelante.  Tiene fiebre.  Tiene los ganglios del cuello hinchados.  Siente dolor al tragar.  Tiene zonas blancas en la parte de atrs de la garganta. SOLICITE ATENCIN MDICA DE INMEDIATO SI:  Tiene sntomas intensos o persistentes de:  Dolor de [place].  Dolor de odos.  Dolor sinusal.  Dolor en el pecho.  Tiene enfermedad pulmonar crnica y cualquiera de estos sntomas:  Sibilancias.  Tos prolongada.  Tos con sangre.  Cambio en la mucosidad habitual.  Presenta rigidez en el cuello.  Tiene cambios en:  La visin.  La audicin.  El pensamiento.  El [place] de nimo. ASEGRESE DE QUE:  Comprende estas instrucciones.  Controlar su afeccin.  Recibir ayuda de inmediato si no mejora o si empeora. Esta informacin no tiene Hydrologist el consejo del mdico. Asegrese de hacerle al mdico cualquier pregunta que tenga. Document Released: 09/09/2005 Document Revised: 04/16/2015 Document Reviewed: 03/07/2014 Elsevier Interactive Patient Education  2017 [place] de hgado (Liver Cancer) El cncer de hgado es un crecimiento anormal de clulas (tumor) en el hgado que es canceroso ([place]). El hgado se encuentra en la parte superior derecha del abdomen, justo por debajo de las costillas. Es el rgano ms grande del cuerpo. El hgado:  Descompone y [place] nutrientes, como el azcar y Tour manager.  Fabrica los componentes de la sangre que ayudan en el proceso de coagulacin e impiden el sangrado excesivo.  Limpia (filtra) el alcohol, los frmacos y las sustancias dainas de la [place].  Ayuda a absorber las grasas y otros nutrientes. CAUSAS Se desconoce la causa exacta de esta afeccin. FACTORES DE RIESGO Es ms probable que esta afeccin se manifieste en:  Hombres.  Estadounidenses asiticos e isleos del Pacfico.  Personas con cicatrices en el hgado (cirrosis). Las causas de la cirrosis pueden ser las siguientes:  Consumo excesivo de bebidas alcohlicas.  HepatitisB o hepatitisC.  Fumar.  Las [employer] tienen ciertas enfermedades hepticas, como la enfermedad de [place].  Las [employer] tienen la enfermedad de hgado graso no relacionada con el alcohol.  Los diabticos.  Los que tienen sobrepeso.  Las personas expuestas a aflatoxinas. Estas son sustancias producidas por ciertos tipos de moho que crecen en los alimentos, como el maz y el man.  Las personas expuestas al cloruro de vinilo. Esta sustancia qumica se utiliza para fabricar determinados plsticos.  Las personas que usan esteroides anablicos. Los esteroides anablicos son Ardelia Mems hormona que se [place] para aumentar la masa muscular.  Las personas que beben agua contaminada con [place] personas que tienen una  enfermedad llamada esquistosomiasis. SNTOMAS Esta enfermedad a menudo causa algunos sntomas al comienzo o no presenta sntomas. A medida que el cncer avanza, los sntomas pueden ser los siguientes:  Prdida de peso sin Product/process development scientist.  Prdida del apetito.  Nuseas o vmitos.  Picazn.  Un bulto o una masa duros debajo de las [place].  Hematomas o sangrado anormales.  Sentirse muy dbil y cansado.  Dolor en el lado derecho del abdomen, en el omplato o en la espalda.  Sensacin de saciedad despus de ingerir poca cantidad de comida.  Distensin abdominal.  Cristy Hilts.  Color amarillo en la piel o los ojos (ictericia).  Orina de color oscuro.  Materia fecal blanca o color tiza. El cncer de hgado puede causar otras afecciones, por ejemplo:  Hipercalcemia. Se trata de niveles altos de calcio en la sangre, que pueden producir debilidad, estreimiento o sensacin de confusin.  Hipoglucemia. Es un nivel bajo de azcar en la sangre, que puede provocar fatiga, confusin mental y sudoracin.  Agrandamiento de los senos (ginecomastia), si se aplica.  Retraccin de los testculos, si se aplica.  Verse y sentirse ruborizado debido a una alta cantidad de glbulos rojos.  Niveles elevados de colesterol. DIAGNSTICO Esta afeccin se diagnostica mediante sus antecedentes mdicos y un examen fsico. Tambin pueden hacerle otros estudios, por ejemplo:  Anlisis de [place].  Pruebas de diagnstico por imgenes, como:  Tomografa computarizada (TC).  Resonancia magntica (RM).  Ecografa.  Angiografa. Es un tipo de radiografa que [place] colorante.  [place] sea.  Laparoscopia. En este examen, el mdico utiliza una pequea cmara con luz (laparoscopio) para observar el hgado y otros rganos.  Examen de [place] de tejido (biopsia) tomada del hgado. Si se confirma el diagnstico de cncer de hgado, se lo estadificar para determinar su gravedad y extensin. La  estadificacin es la evaluacin de lo siguiente:  El tamao del tumor.  Si el cncer se ha diseminado.  Adnde se ha diseminado. TRATAMIENTO El tratamiento del cncer de hgado depende del tipo y del estadio del cncer, qu tan bien funciona el hgado y del [place] de salud general. El tratamiento puede incluir uno o ms de los siguientes:  [place] para extirpar todo lo que sea posible del tumor. A veces, se extirpa el hgado por completo y se lo reemplaza con un hgado sano (trasplante de hgado).  Quimioterapia. Este tratamiento [place] medicamentos para destruir las clulas cancerosas.  Tratamiento dirigido. Este tratamiento apunta a partes especficas de las clulas cancerosas y la zona alrededor de ellas para bloquear el crecimiento y la propagacin del cncer.  Inmunoterapia. Tambin conocida como terapia biolgica. Esta terapia fortalece el sistema de defensa (inmunitario) del organismo para destruir o Architectural technologist crecimiento y la propagacin de las clulas cancerosas.  Radioterapia. Este tratamiento utiliza rayosX de alta potencia para destruir las clulas cancerosas.  Ablacin. Este procedimiento destruye las clulas tumorales utilizando ondas de radio de alta potencia o un tipo de alcohol (etanol). Se puede realizar si tiene algunos tumores pequeos o si la [place] no es una opcin.  Tratamiento con microondas. Este tratamiento [place] el calor para destruir las clulas cancerosas.  Criociruga. Tambin se denomina crioterapia. Este procedimiento [employer]  fro para congelar y [place] clulas cancerosas.  Embolizacin. Es un procedimiento que bloquea el flujo sanguneo que se dirige hacia el tumor de hgado.  Quimioembolizacin. Este procedimiento [employer] embolizacin con perlas pequeas que administran quimioterapia directamente en el lugar donde est el tumor.  Radioembolizacin. Este procedimiento combina la embolizacin con perlas pequeas que suministran radiacin  directamente en el lugar donde se encuentra el tumor. Segn el tipo de tratamiento que reciba, el mdico tambin puede recomendarle que se vacune contra la hepatitisA y la hepatitisB. INSTRUCCIONES PARA EL CUIDADO EN EL HOGAR  Considere la posibilidad de participar en un grupo de apoyo.  Consulte al mdico lo siguiente:  Consejos que lo ayuden a [employer] secundarios del [place].  La mejor dieta para usted. Esta dieta puede incluir el consumo de ms frutas y verduras.  Tome los medicamentos de venta libre y los recetados solamente como se lo haya indicado el mdico.  Consulting civil engineer a todas las visitas de control como se lo haya indicado el mdico. Esto es importante.  No beba alcohol. SOLICITE ATENCIN MDICA SI:  No puede comer porque siente nuseas.  Se siente ms dbil o ms cansado que de costumbre.  El dolor [place].  Se siente deprimido o ansioso. SOLICITE ATENCIN MDICA DE INMEDIATO SI:  Siente que se desmayar o se siente muy dbil.  Se siente confundido.  Se siente muy somnoliento Agricultural consultant.  Siente un dolor intenso en el abdomen que no se alivia.  El abdomen o las piernas comienzan a hincharse.  Tiene nuseas, vmitos, diarrea o estreimiento que no se calman.  Tiene fiebre, escalofros o [employer] cuerpo.  Observa un sangrado inusual o un sangrado que no se detiene rpidamente.  La materia fecal es [place], [place], o tiene [place].  Los ojos o la piel [place] a un color ms [employer]. Esta informacin no tiene Marine scientist el consejo del mdico. Asegrese de hacerle al mdico cualquier pregunta que tenga. Document Released: 08/12/2011 Document Revised: 02/22/2012 Document Reviewed: 10/10/2015 Elsevier Interactive Patient Education  2017 [employer].

## 2017-01-21 ENCOUNTER — Encounter (HOSPITAL_COMMUNITY): Payer: Self-pay | Admitting: Neurology

## 2017-01-21 ENCOUNTER — Emergency Department (HOSPITAL_COMMUNITY): Payer: Medicare Other

## 2017-01-21 ENCOUNTER — Ambulatory Visit (HOSPITAL_COMMUNITY): Admit: 2017-01-21 | Payer: Medicare Other | Admitting: Interventional Cardiology

## 2017-01-21 ENCOUNTER — Encounter (HOSPITAL_COMMUNITY): Admission: EM | Disposition: E | Payer: Self-pay | Source: Home / Self Care | Attending: Emergency Medicine

## 2017-01-21 DIAGNOSIS — C220 Liver cell carcinoma: Secondary | ICD-10-CM | POA: Diagnosis present

## 2017-01-21 DIAGNOSIS — D649 Anemia, unspecified: Secondary | ICD-10-CM | POA: Diagnosis present

## 2017-01-21 DIAGNOSIS — E875 Hyperkalemia: Secondary | ICD-10-CM | POA: Diagnosis present

## 2017-01-21 DIAGNOSIS — K7460 Unspecified cirrhosis of liver: Secondary | ICD-10-CM | POA: Diagnosis present

## 2017-01-21 DIAGNOSIS — N179 Acute kidney failure, unspecified: Secondary | ICD-10-CM | POA: Diagnosis present

## 2017-01-21 DIAGNOSIS — Z515 Encounter for palliative care: Secondary | ICD-10-CM | POA: Diagnosis present

## 2017-01-21 DIAGNOSIS — D696 Thrombocytopenia, unspecified: Secondary | ICD-10-CM | POA: Diagnosis present

## 2017-01-21 DIAGNOSIS — K7200 Acute and subacute hepatic failure without coma: Secondary | ICD-10-CM | POA: Diagnosis present

## 2017-01-21 DIAGNOSIS — Z90710 Acquired absence of both cervix and uterus: Secondary | ICD-10-CM

## 2017-01-21 DIAGNOSIS — E872 Acidosis, unspecified: Secondary | ICD-10-CM

## 2017-01-21 DIAGNOSIS — F329 Major depressive disorder, single episode, unspecified: Secondary | ICD-10-CM | POA: Diagnosis present

## 2017-01-21 DIAGNOSIS — I214 Non-ST elevation (NSTEMI) myocardial infarction: Secondary | ICD-10-CM | POA: Diagnosis present

## 2017-01-21 DIAGNOSIS — B1920 Unspecified viral hepatitis C without hepatic coma: Secondary | ICD-10-CM | POA: Diagnosis present

## 2017-01-21 DIAGNOSIS — D72829 Elevated white blood cell count, unspecified: Secondary | ICD-10-CM | POA: Diagnosis present

## 2017-01-21 DIAGNOSIS — I469 Cardiac arrest, cause unspecified: Secondary | ICD-10-CM | POA: Diagnosis not present

## 2017-01-21 DIAGNOSIS — Z91013 Allergy to seafood: Secondary | ICD-10-CM | POA: Diagnosis not present

## 2017-01-21 DIAGNOSIS — J9601 Acute respiratory failure with hypoxia: Secondary | ICD-10-CM | POA: Diagnosis present

## 2017-01-21 DIAGNOSIS — G931 Anoxic brain damage, not elsewhere classified: Secondary | ICD-10-CM | POA: Diagnosis present

## 2017-01-21 DIAGNOSIS — E119 Type 2 diabetes mellitus without complications: Secondary | ICD-10-CM | POA: Diagnosis present

## 2017-01-21 DIAGNOSIS — R109 Unspecified abdominal pain: Secondary | ICD-10-CM | POA: Diagnosis not present

## 2017-01-21 DIAGNOSIS — J9600 Acute respiratory failure, unspecified whether with hypoxia or hypercapnia: Secondary | ICD-10-CM

## 2017-01-21 DIAGNOSIS — K7290 Hepatic failure, unspecified without coma: Secondary | ICD-10-CM | POA: Diagnosis present

## 2017-01-21 DIAGNOSIS — I10 Essential (primary) hypertension: Secondary | ICD-10-CM | POA: Diagnosis present

## 2017-01-21 DIAGNOSIS — R579 Shock, unspecified: Secondary | ICD-10-CM | POA: Diagnosis present

## 2017-01-21 DIAGNOSIS — Z79899 Other long term (current) drug therapy: Secondary | ICD-10-CM

## 2017-01-21 DIAGNOSIS — M1990 Unspecified osteoarthritis, unspecified site: Secondary | ICD-10-CM | POA: Diagnosis present

## 2017-01-21 DIAGNOSIS — Z4682 Encounter for fitting and adjustment of non-vascular catheter: Secondary | ICD-10-CM | POA: Diagnosis not present

## 2017-01-21 LAB — DIFFERENTIAL
Basophils Absolute: 0.3 10*3/uL — ABNORMAL HIGH (ref 0.0–0.1)
Basophils Relative: 1 %
EOS PCT: 1 %
Eosinophils Absolute: 0.3 10*3/uL (ref 0.0–0.7)
LYMPHS ABS: 10.4 10*3/uL — AB (ref 0.7–4.0)
Lymphocytes Relative: 31 %
MONO ABS: 2.3 10*3/uL — AB (ref 0.1–1.0)
MONOS PCT: 7 %
NEUTROS ABS: 20.1 10*3/uL — AB (ref 1.7–7.7)
Neutrophils Relative %: 60 %

## 2017-01-21 LAB — POCT I-STAT 3, ART BLOOD GAS (G3+)
Acid-base deficit: <30 mmol/L — ABNORMAL HIGH (ref 0.0–2.0)
Bicarbonate: 1.9 mmol/L — ABNORMAL LOW (ref 20.0–28.0)
O2 Saturation: 55 %
PH ART: 6.528 — AB (ref 7.350–7.450)
PO2 ART: 70 mmHg — AB (ref 83.0–108.0)
TCO2: <5 mmol/L (ref 0–100)
pCO2 arterial: 22.3 mmHg — ABNORMAL LOW (ref 32.0–48.0)

## 2017-01-21 LAB — COMPREHENSIVE METABOLIC PANEL
ALBUMIN: 1 g/dL — AB (ref 3.5–5.0)
ALK PHOS: 175 U/L — AB (ref 38–126)
ALT: 100 U/L — ABNORMAL HIGH (ref 14–54)
AST: 223 U/L — AB (ref 15–41)
BILIRUBIN TOTAL: 3.3 mg/dL — AB (ref 0.3–1.2)
BUN: 24 mg/dL — AB (ref 6–20)
CALCIUM: 9.3 mg/dL (ref 8.9–10.3)
CO2: <7 mmol/L — ABNORMAL LOW (ref 22–32)
CREATININE: 1.84 mg/dL — AB (ref 0.44–1.00)
Chloride: 111 mmol/L (ref 101–111)
GFR calc Af Amer: 30 mL/min — ABNORMAL LOW (ref >60)
GFR, EST NON AFRICAN AMERICAN: 26 mL/min — AB (ref >60)
GLUCOSE: 141 mg/dL — AB (ref 65–99)
POTASSIUM: 6.3 mmol/L — AB (ref 3.5–5.1)
Sodium: 141 mmol/L (ref 135–145)
Total Protein: 3.8 g/dL — ABNORMAL LOW (ref 6.5–8.1)

## 2017-01-21 LAB — CREATININE, SERUM
Creatinine, Ser: 1.71 mg/dL — ABNORMAL HIGH (ref 0.44–1.00)
GFR calc Af Amer: 33 mL/min — ABNORMAL LOW (ref >60)
GFR calc non Af Amer: 29 mL/min — ABNORMAL LOW (ref >60)

## 2017-01-21 LAB — I-STAT TROPONIN, ED: TROPONIN I, POC: 0.59 ng/mL — AB (ref 0.00–0.08)

## 2017-01-21 LAB — LIPID PANEL
Cholesterol: 55 mg/dL (ref 0–200)
TRIGLYCERIDES: 161 mg/dL — AB (ref <150)
VLDL: 32 mg/dL (ref 0–40)

## 2017-01-21 LAB — CBC
HCT: 16.8 % — ABNORMAL LOW (ref 36.0–46.0)
HEMATOCRIT: 8.4 % — AB (ref 36.0–46.0)
HEMOGLOBIN: 2.3 g/dL — AB (ref 12.0–15.0)
Hemoglobin: 5 g/dL — CL (ref 12.0–15.0)
MCH: 28.7 pg (ref 26.0–34.0)
MCH: 28.8 pg (ref 26.0–34.0)
MCHC: 29.8 g/dL — AB (ref 30.0–36.0)
MCHC: 27.4 g/dL — ABNORMAL LOW (ref 30.0–36.0)
MCV: 96.6 fL (ref 78.0–100.0)
MCV: 105 fL — AB (ref 78.0–100.0)
PLATELETS: 54 10*3/uL — AB (ref 150–400)
Platelets: 47 10*3/uL — ABNORMAL LOW (ref 150–400)
RBC: 1.74 MIL/uL — ABNORMAL LOW (ref 3.87–5.11)
RBC: <1 MIL/uL — ABNORMAL LOW (ref 3.87–5.11)
RDW: 20.1 % — AB (ref 11.5–15.5)
RDW: 21.4 % — AB (ref 11.5–15.5)
WBC: 33.4 10*3/uL — ABNORMAL HIGH (ref 4.0–10.5)
WBC: 24.9 10*3/uL — ABNORMAL HIGH (ref 4.0–10.5)

## 2017-01-21 LAB — PROTIME-INR: Prothrombin Time: >90 seconds — ABNORMAL HIGH (ref 11.4–15.2)

## 2017-01-21 LAB — APTT: aPTT: >200 seconds (ref 24–36)

## 2017-01-21 LAB — I-STAT CG4 LACTIC ACID, ED: Lactic Acid, Venous: >17 mmol/L (ref 0.5–1.9)

## 2017-01-21 LAB — PROCALCITONIN: PROCALCITONIN: 0.27 ng/mL

## 2017-01-21 LAB — MRSA PCR SCREENING: MRSA by PCR: NEGATIVE

## 2017-01-21 LAB — TROPONIN I
Troponin I: 0.31 ng/mL (ref <0.03)
Troponin I: 0.42 ng/mL (ref <0.03)

## 2017-01-21 SURGERY — LEFT HEART CATH AND CORONARY ANGIOGRAPHY

## 2017-01-21 MED ORDER — SODIUM CHLORIDE 0.9 % IV SOLN: 10.0000 mL/h | INTRAVENOUS | Status: DC

## 2017-01-21 MED ORDER — DEXTROSE 50 % IV SOLN: 1.0000 | Freq: Once | INTRAVENOUS | Status: DC

## 2017-01-21 MED ORDER — INSULIN ASPART 100 UNIT/ML IV SOLN
10.0000 [IU] | Freq: Once | INTRAVENOUS | Status: DC
  Filled 2017-01-21: qty 0.1

## 2017-01-21 MED ORDER — EPINEPHRINE PF 1 MG/ML IJ SOLN
0.5000 ug/min | INTRAVENOUS | Status: DC
  Administered 2017-01-21: 20 ug/min via INTRAVENOUS
  Administered 2017-01-21: 4 ug/min via INTRAVENOUS
  Filled 2017-01-21 (×3): qty 4

## 2017-01-21 MED ORDER — SODIUM BICARBONATE 8.4 % IV SOLN
INTRAVENOUS | Status: DC
  Filled 2017-01-21 (×2): qty 150

## 2017-01-21 MED ORDER — NOREPINEPHRINE BITARTRATE 1 MG/ML IV SOLN
5.0000 ug/min | INTRAVENOUS | Status: DC
  Administered 2017-01-21: 50 ug/min via INTRAVENOUS
  Administered 2017-01-21: 10 ug/min via INTRAVENOUS
  Administered 2017-01-21: 5 ug/min via INTRAVENOUS
  Filled 2017-01-21 (×2): qty 4

## 2017-01-21 MED ORDER — PANTOPRAZOLE SODIUM 40 MG IV SOLR: 40.0000 mg | Freq: Every day | INTRAVENOUS | Status: DC

## 2017-01-21 MED ORDER — SODIUM CHLORIDE 0.9 % IV BOLUS (SEPSIS)
1000.0000 mL | Freq: Once | INTRAVENOUS | Status: AC
  Administered 2017-01-21: 1000 mL via INTRAVENOUS

## 2017-01-21 MED ORDER — SODIUM CHLORIDE 0.9 % IV SOLN: 250.0000 mL | INTRAVENOUS | Status: DC | PRN

## 2017-01-21 MED ORDER — HEPARIN SODIUM (PORCINE) 5000 UNIT/ML IJ SOLN: 5000.0000 [IU] | Freq: Three times a day (TID) | INTRAMUSCULAR | Status: DC

## 2017-01-21 MED ORDER — ASPIRIN 300 MG RE SUPP: 300.0000 mg | RECTAL | Status: DC

## 2017-01-21 MED ORDER — SODIUM POLYSTYRENE SULFONATE 15 GM/60ML PO SUSP: 15.0000 g | Freq: Once | ORAL | Status: DC

## 2017-01-21 MED ORDER — ASPIRIN 81 MG PO CHEW: 324.0000 mg | CHEWABLE_TABLET | Freq: Once | ORAL | Status: DC

## 2017-01-22 LAB — PATHOLOGIST SMEAR REVIEW

## 2017-01-25 ENCOUNTER — Telehealth: Payer: Self-pay

## 2017-01-25 ENCOUNTER — Encounter (HOSPITAL_COMMUNITY): Payer: Self-pay | Admitting: Emergency Medicine

## 2017-01-25 LAB — LACTIC ACID, PLASMA: Lactic Acid, Venous: 20.5 mmol/L (ref 0.5–1.9)

## 2017-01-25 NOTE — Telephone Encounter (Signed)
On 01/25/2017 I received a death certificate from [employer] (original). The death certificate is for burial. The patient is a patient of Doctor Byrum. The death certificate will be taken to [facility] this pm for signature.  On 02-23-2017 I received the death certificate back from Doctor Byrum. I got the death certificate ready and called the funeral home to let them know the death certificate is ready for pickup. I also faxed a copy to the funeral home per the funeral home request.

## 2017-02-11 NOTE — Progress Notes (Signed)
Pts heart monitor showed asystole. Upon assessment and auscultation of heart sounds pt had no heart beat and no pulse. Pt pupils fixed and dilated. This was verified with Apolonio Schneiders, RN. Time of death 03/29/1206. Family at bedside. Support given to family.   [place] [facility] [facility] called. Pt is not suitable for any donation. Referral number is [id number].

## 2017-02-11 NOTE — H&P (Signed)
PULMONARY / CRITICAL CARE MEDICINE   Name: Heidi Booker MRN: [id number] DOB: [birth year]    ADMISSION DATE:  2017/01/25 CONSULTATION DATE:  Jan 25, 2017  REFERRING MD:  Dr. Leonette Monarch   CHIEF COMPLAINT:  Cardiac Arrest   HISTORY OF PRESENT ILLNESS:   71 year old with PMH of Hep C, liver cirrhosis, hepatocellular carcinoma (being followed by Dr. Burr Medico, offering palliative care), HTN, and DM, whom is a Jehovah's Witness. Presents to ED on 2/8 after being found unresponsive at home for an unknown amount of time. When EMS arrived patient was pulseless for 25 mins before return of ROSC for 10 minutes, when she then went PEA for 15 minutes before return of ROSC. Upon arrival to ED was intubated Daughter states that yesterday patient was feeling weak and tired but otherwise in usual state of health.   Of note patient has been consulted for palliative and hospice care, however family/patient has been refusing.   PAST MEDICAL HISTORY :  She  has a past medical history of Arthritis (08/24/2014); Cancer ([place]); Depression (08/24/2014); Hepatic cirrhosis ([place]) (01/21/2015); Hepatitis C; Hepatitis C (01/21/2015); Hypertension; Liver cancer ([place]) (07/05/2015); Nephrolithiasis (08/24/2014); Refusal of blood transfusions as patient is Jehovah's Witness; and Ulcer ([place]).  PAST SURGICAL HISTORY: She  has a past surgical history that includes Partial hysterectomy; Knee arthroscopy (Left); Esophagogastroduodenoscopy (N/A, 05/09/2014); Abdominal hysterectomy; Knee surgery; and Tonsilectomy/adenoidectomy with myringotomy.  Allergies  Allergen Reactions  . Other Swelling    octopus    No current facility-administered medications on file prior to encounter.    Current Outpatient Prescriptions on File Prior to Encounter  Medication Sig  . azithromycin (ZITHROMAX) 250 MG tablet Take 2 tablets today then 1 tablet daily for 4 days starting tomorrow  . cholestyramine (QUESTRAN) 4 g packet Take 2 packets (8 g total) by mouth 2  (two) times daily.  . furosemide (LASIX) 20 MG tablet Take 1 tablet (20 mg total) by mouth 2 (two) times daily.  . Incontinence Supply Disposable (DEPEND UNDERWEAR X-LARGE) MISC Use as needed  . lactulose (CHRONULAC) 10 GM/15ML solution Take 15 mLs (10 g total) by mouth 2 (two) times daily as needed for mild constipation.  Marland Kitchen loratadine (CLARITIN) 10 MG tablet Take 1 tablet (10 mg total) by mouth daily.  . Misc. Devices (ADJUSTABLE COMMODE 3-IN-1) MISC 1 each by Does not apply route as needed.  . Misc. Devices (TRANSFER BENCH) MISC 1 each by Does not apply route as needed.  . neomycin-polymyxin-hydrocortisone (CORTISPORIN) otic solution Place 3 drops into the left ear 4 (four) times daily.  . pantoprazole (PROTONIX) 40 MG tablet Take 1 tablet (40 mg total) by mouth 2 (two) times daily.  Marland Kitchen spironolactone (ALDACTONE) 50 MG tablet Take 1 tablet (50 mg total) by mouth daily.  . traMADol (ULTRAM) 50 MG tablet Take 1 tablet (50 mg total) by mouth every 8 (eight) hours as needed.  . vitamin B-12 (CYANOCOBALAMIN) 1000 MCG tablet Take 1 tablet (1,000 mcg total) by mouth daily.  . Vitamin D, Ergocalciferol, (DRISDOL) 50000 units CAPS capsule Take 1 capsule (50,000 Units total) by mouth every 7 (seven) days.    FAMILY HISTORY:  Her indicated that the status of her neg hx is unknown.    SOCIAL HISTORY: She  reports that she has never smoked. She has never used smokeless tobacco. She reports that she does not drink alcohol or use drugs.  REVIEW OF SYSTEMS:   Unable to review as patient is intubated   SUBJECTIVE:  On levophed and  EPI gtt. Loletha Grayer and hypotensive   VITAL SIGNS: BP 93/63   Pulse (!) 46   Temp (!) 90.7 F (32.6 C) (Rectal)   Resp 24   SpO2 100%   HEMODYNAMICS:    VENTILATOR SETTINGS: Vent Mode: PRVC FiO2 (%):  [100 %] 100 % Vt Set:  [460 mL] 460 mL PEEP:  [5 cmH20] 5 cmH20 Plateau Pressure:  [26 cmH20] 26 cmH20  INTAKE / OUTPUT: No intake/output data recorded.  PHYSICAL  EXAMINATION: General: Critically ill female, no distress  Neuro: Pupils fixed and dilated, unresponsive, no gag,no corneal   HEENT: ETT in place  Cardiovascular: Brady, NI S1/S2 no MRG  Lungs: clear, non-labored Abdomen:  Distended, hypoactive bowel sounds  Musculoskeletal: warm, dry, intact   Skin: cool, dry, intact    LABS:  BMET  Recent Labs Lab 02-09-17 0659  NA 141  K 6.3*  CL 111  CO2 <7*  BUN 24*  CREATININE 1.84*  GLUCOSE 141*    Electrolytes  Recent Labs Lab 02/09/17 0659  CALCIUM 9.3    CBC  Recent Labs Lab 2017-02-09 0659  WBC 33.4*  HGB 5.0*  HCT 16.8*  PLT 54*    Coag's  Recent Labs Lab Feb 09, 2017 0659  APTT >200*  INR >10.00*    Sepsis Markers  Recent Labs Lab 02-09-17 0708  LATICACIDVEN >17.00*    ABG No results for input(s): PHART, PCO2ART, PO2ART in the last 168 hours.  Liver Enzymes  Recent Labs Lab 02/09/17 0659  AST 223*  ALT 100*  ALKPHOS 175*  BILITOT 3.3*  ALBUMIN 1.0*    Cardiac Enzymes  Recent Labs Lab February 09, 2017 0659  TROPONINI 0.31*    Glucose No results for input(s): GLUCAP in the last 168 hours.  Imaging Ct Head Wo Contrast  Result Date: 2017/02/09 CLINICAL DATA:  Status post cardiac arrest EXAM: CT HEAD WITHOUT CONTRAST TECHNIQUE: Contiguous axial images were obtained from the base of the skull through the vertex without intravenous contrast. COMPARISON:  None. FINDINGS: Brain: Scattered areas of decreased attenuation are noted consistent with chronic white matter ischemic change. No findings to suggest acute hemorrhage, acute infarction or space-occupying mass lesion are noted. Vascular: No hyperdense vessel or unexpected calcification. Skull: No acute fracture is identified. Sinuses/Orbits: Mild air-fluid levels are noted within the sphenoid sinus and right maxillary antrum. Other: Nasogastric catheter is noted in place. Considerable subcutaneous emphysema is noted particularly around the preseptal  region of the right orbit inferiorly. This extends to the inferior aspects of the film. No pneumothorax was noted on recent chest x-ray IMPRESSION: Chronic white matter ischemic changes. No acute intracranial abnormality noted. Subcutaneous emphysema similar to that noted on recent chest x-ray of uncertain etiology. Question related to recent tube placement clinical correlation is recommended. Electronically Signed   By: Inez Catalina M.D.   On: 09-Feb-2017 07:43   Dg Chest Port 1 View  Result Date: 02-09-2017 CLINICAL DATA:  Status post intubation, recent CPR EXAM: PORTABLE CHEST 1 VIEW COMPARISON:  05/08/2014 FINDINGS: Cardiac shadow is mildly enlarged. Endotracheal tube is noted in satisfactory position. Nasogastric catheter is noted but coiled upon itself within the distal esophagus. This could be withdrawn and readvanced into the stomach. The lungs are well aerated bilaterally. Some generalized increased density is noted over the right chest which may represent some mild pulmonary edema. No focal confluent infiltrate is seen. There are changes suggestive of subcutaneous emphysema in the supraclavicular regions bilaterally. No pneumothorax is noted. IMPRESSION: Nasogastric catheter coiled upon itself within  the distal esophagus. Endotracheal tube in satisfactory position. Mild increased density in the right hemithorax likely related to asymmetric pulmonary edema. Changes suggestive of subcutaneous emphysema in the supraclavicular region. Clinical correlation is recommended. Electronically Signed   By: Inez Catalina M.D.   On: 02/14/17 07:40     STUDIES:  CXR 2/8 > Mild increased density in the right hemithorax likely related to asymmetric pulmonary edema, changes suggestive of subcutaneous emphysema in the supraclavicular region CT Head 2/8 > No acute   CULTURES:   ANTIBIOTICS:   SIGNIFICANT EVENTS: 2/8 > Cardiac Arrest   LINES/TUBES: ETT 2/8 >>   DISCUSSION: 71 year old female presents  cardiac arrest down for 45 minutes total. While in ED family wanted to proceed with full code. Patient was placed on levophed and EPI. While in ED went PEA again for an additional 10 minutes to return of ROSC, family understands that further chest compression would not be beneficial and have agreed to DNR.   ASSESSMENT / PLAN:  PULMONARY A: Acute Hypoxic Respiratory Failure  P:   Vent support PAD protocol  Wean as tolerated - Mental Status Barrier to Extubation  ABG now   CARDIOVASCULAR A:  Cardiac Arrest  Septic Shock  P:  Trend Troponin  Maintain MAP >65  Wean Levophed and EPI gtt ECHO Cardiology following   RENAL A:   Hyperkalemia  Lactic Acidosis  Acute Kidney Injury   P:   Trend BMP Replace electrolytes as needed  Temporize and Kayexalate  Sodium Bicarb gtt @ 15ml/hr  GASTROINTESTINAL A:   Liver Cirrhosis  Hep C  P:   NPO   HEMATOLOGIC A:   HCC Anemia  P:  Trend CBC Primary Oncology [employer] per family - explained patient overall poor prognosis and End Stage Cancer   INFECTIOUS A:   Leukocytosis  P:   Trend Fever and WBC curve  Trend Lactic Acid and Procal  PAN Culture   ENDOCRINE A:   DM P:   SSI Q4H glucose checks   NEUROLOGIC A:   Encephalopathy  P:   Monitor  EEG  RASS goal: 0/-1    FAMILY  - Updates: Spoke with family extensively about goals of care. Daughter asked to speak with oncology and primary care doctor. Both were called and spoke to daughter about extensive disease and that they no longer had options  - Inter-disciplinary family meet or Palliative Care meeting due by:  2/15  CC time: 4 mins   Hayden Pedro, AG-ACNP [facility]  Pgr: [phone]  PCCM Pgr: [phone]  Attending Note:  I have examined patient, reviewed labs, studies and notes. I have discussed the case with Beaulah Corin, and I agree with the data and plans as amended above.   71 yo woman w hx Hep C and  hepatocellular CA, cirrhosis. Her malignancy is progressed and she has been offered palliation. She was found unresponsive am 2/8 for unclear duration. She was pulseless and underwent a total of 40 + minutes CPR before partial stabilization on MV, epi, norepi gtt's. Initial eval showed acute on chronic anemia, profound metabolic acidosis. On my evaluation she is in shock on max dose epi and norepi. Her pupils are blown. No responsiveness at all. Coarse breath sounds bilaterally. Pulse is thready. I have spoken with her daughters today, have explained to them that we have not achieved hemodynamic stability and that she shows evidence for a profound neurological injury. Given this I have explained that she  will not survive no matter what we do. They are appropriately sad but do understand that she is dying. I will not escalate care from here, will avoid any labs or other meds. If she does stabilize (doubt) then I will scale back pressors. Will work to get the family at bedside to spend time with her, will make her comfortable to the best of our ability.   Independent critical care time is 45 minutes.   Baltazar Apo, MD, PhD 2017-02-05, 11:13 AM [facility] [phone] or if no answer [phone]

## 2017-02-11 NOTE — ED Notes (Signed)
At bedside with family and CC NP, pt no pulse. Family wants CPR. Is trying to make a decision about end of life care.

## 2017-02-11 NOTE — ED Notes (Signed)
Canceled code stemi per Dr. Tamala Julian

## 2017-02-11 NOTE — ED Notes (Signed)
Report given to [facility], RN 18M.

## 2017-02-11 NOTE — Progress Notes (Signed)
Pt bagged on 100% fio2 ambu bag during code.

## 2017-02-11 NOTE — ED Notes (Signed)
Interventional Cardiology  STEMI Evaluation   57 female found on the floor, unresponsive at home. Down time is unknown. First responders started BCLS and AED did not identify a shockable rhythm. Background history is that of hepatitis C, hepatic cirrhosis, stage II hepatocellular carcinoma, history of GI bleed, prior TIA, essential hypertension, diabetes mellitus, and active hospice care. She is a Restaurant manager, fast food. She has refused Immuno/chemotherapy treatments last fall.  Upon arrival of EMS, CPR was in progress by first responders. The initial transmitted EKG revealed ST elevation in aVR with diffuse ST segment depression in other leads with sinus rhythm. Heart rate was in the 70s. CPR continued and no shockable rhythms were identified. PEA was noted. Total CPR greater than 15 minutes before ROSC.  On exam, the patient is unresponsive in the emergency room. Pupils are fixed and dilated. Heart tones are distant. No spontaneous movement is noted. Repeat EKG reveals no ischemic abnormality.  Impression: Out of hospital cardiac arrest, down time prior to CPR is unknown. Absence of ischemic changes on EKG, history of liver cancer, cirrhosis, and prolonged CPR make her a poor candidate for invasive strategy. Furthermore, there is no evidence of active/ongoing ischemia on EKG.  Recommendation: Cancel STEMI activation. Medical management per critical care medicine who determine if she is a candidate for cooling. Please contact us if further cardiac issues arise.  Critical care time 30 minutes related to accumulation of information, complex decision-making concerning potential invasive cardiac treatment, and coordinating care with other providers.Marland Kitchen

## 2017-02-11 NOTE — ED Notes (Signed)
Spoke with CC NP, made aware of venous gas. Only has 1 peripheral IV with IO. NP and MD will come to 2 M and will discuss next steps. Also made aware, the hyperkalemia meds have not been given due to line limitation. Will hold per verbal order.

## 2017-02-11 NOTE — Discharge Summary (Signed)
PULMONARY / CRITICAL CARE MEDICINE Death Summary  Name: Heidi Booker MRN: [id number] DOB: 03-Jul-1946    ADMISSION DATE:  02-15-2017 Date of Death: 15-Feb-2017  Final cause of death: Out of hospital cardiac arrest, asystole  Secondary causes of death Acute respiratory failure with hypoxemia and hypercapnia Hypoxemic encephalopathy Cardiogenic shock Metastatic hepatocellular carcinoma Cirrhosis Hepatitis C Hypertension Diabetes mellitus Acute on chronic anemia, suspected acute GI blood loss  Leukocytosis Thrombocytopenia Hepatic encephalopathy Hepatic coagulopathy Acute renal failure Hyperkalemia Acute lactic acidosis, anion gap metabolic acidosis Shock liver Acute stress non-ST elevation MI secondary to general hypoperfusion Refusal of blood transfusions, Jehovah's Witness History of hypertension  HISTORY OF PRESENT ILLNESS:   71 year old with PMH of Hep C, cirrhosis, hepatocellular carcinoma (being followed by Dr. Burr Medico, offering palliative care), HTN, and DM, whom is a Jehovah's Witness. Presents to ED on 15-Feb-2023 after being found unresponsive at home for an unknown amount of time. When EMS arrived patient was pulseless for 25 mins before return of ROSC for 10 minutes, when she then went PEA for 15 minutes before return of ROSC. She had been feeling weak but there was no clear prodrome or other symptoms reported by her family. Of note patient has been consulted for palliative and hospice care, however family/patient has been refusing. Attempts were made to achieve stabilization with high-dose epinephrine and epinephrine drips, mechanical ventilation. Unfortunately it was clear that she had experienced a profound neurological injury based on exam-her pupils were fixed and dilated, there was no responsiveness at all on no sedation. Despite all aggressive measures blood pressure did not stabilize, metabolic acidosis worsened. It was explained to family that she would not survive no matter  what interventions were made. Efforts were made to make her comfortable and family was brought to bedside to be with her. She expired soon after arrival to the ICU on 02-15-17.   Baltazar Apo, MD, PhD 01/25/2017, 12:28 AM [facility] [phone] or if no answer [phone]

## 2017-02-11 NOTE — ED Provider Notes (Signed)
[place] DEPT Provider Note   CSN: [id number] Arrival date & time: 02-Feb-2017  [id number]     History   Chief Complaint Chief Complaint  Patient presents with  . Cardiac Arrest    HPI Heidi Booker is a 71 y.o. female.  HPI Remainder of history, ROS, and physical exam limited due to patient's condition Jabier Gauss). Additional information was obtained from EMS  family.   Level V Caveat.  Per EMS, they were called out to patient's home for unresponsiveness. Upon their arrival fire department had been there for approximately 10 minutes performing CPR. Initial rhythm was PEA. Patient was intubated on scene and ACLS was initiated. Patient received several rounds of epinephrine and return of spontaneous circulation was obtained approximately 25 minutes into ACLS. This lasted approximately 10 minutes, and while in transport patient lost pulses again. ACLS was re-initiated and patient received additional epinephrine boluses. In total she received 8 mg of epinephrine.  Per family patient had not been feeling well since yesterday, endorsing worsened chronic pain. This morning approximately around 5:30 to 5:45, they noted that the patient had shallow breathing. One of the daughters who has pulmonary fibrosis applied oxygen to the patient to which she responded well. Once the patient returned back to what they call her baseline, they removed the oxygen. Shortly thereafter they noted that the patient was not breathing. They called 911 who instructed the family to take the patient off the bed and place her on the floor. By the time the patient was on the floor fire department had arrived on scene.  Of note patient is FULL CODE per family and a Jehovah's Witness and does cannot receive any blood transfusions.   Past Medical History:  Diagnosis Date  . Arthritis 08/24/2014   Knees and ankles   . Cancer ([place])   . Depression 08/24/2014   Has a sister that is very sick and dealing with stress of  this   . Hepatic cirrhosis ([place]) 01/21/2015   Lasix 20mg , nadolol to prevent varices from bleeding. From Hep C.  Xifaxin causesd severe abdominal pain. Was given to reduce confusion from cirrhosis.    . Hepatitis C   . Hepatitis C 01/21/2015   Under care Dr. Burnetta Sabin. Starting to treat hep c per patient.    . Hypertension   . Liver cancer ([place]) 07/05/2015  . Nephrolithiasis 08/24/2014  . Refusal of blood transfusions as patient is Jehovah's Witness   . Ulcer [facility])     Patient Active Problem List   Diagnosis Date Noted  . Cardiac arrest ([place])   . Hepatoma ([place])   . Acute upper respiratory infection 12/03/2016  . Controlled type 2 diabetes mellitus without complication, without long-term current use of insulin ([place]) 12/03/2016  . Hospice care 12/03/2016  . History of exposure to infectious disease 08/10/2016  . Itching 05/14/2016  . Cramp of both lower extremities 04/09/2016  . Rectal bleeding 11/04/2015  . Anal fissure 11/04/2015  . Gastroesophageal reflux disease without esophagitis 10/24/2015  . Essential hypertension 10/24/2015  . Mixed incontinence 10/24/2015  . Bloody diarrhea 10/24/2015  . TIA (transient ischemic attack) 09/11/2015  . Osteoarthrosis, localized, primary, knee 08/14/2015  . Hepatocellular carcinoma ([place]) 07/05/2015  . Constipation 04/12/2015  . Right shoulder pain 04/12/2015  . Hepatic cirrhosis ([place]) 01/21/2015  . Hepatitis C 01/21/2015  . Arthritis 08/24/2014  . Depression 08/24/2014  . Hypertension 08/24/2014  . Nephrolithiasis 08/24/2014  . Urine incontinence 08/24/2014  . Nonspecific (abnormal) findings on radiological and other  examination of gastrointestinal tract 08/01/2014  . Hematemesis 05/08/2014  . UGI bleed 05/08/2014  . Hepatitis C   . Hypertension   . Ulcer [facility])     Past Surgical History:  Procedure Laterality Date  . ABDOMINAL HYSTERECTOMY    . ESOPHAGOGASTRODUODENOSCOPY N/A 05/09/2014   Procedure: ESOPHAGOGASTRODUODENOSCOPY (EGD);   Surgeon: Irene Shipper, MD;  Location[facility] END[facility]COPY;  Service: Endoscopy;  Laterality: N/A;  . KNEE ARTHROSCOPY Left   . KNEE SURGERY     "removed something"  . PARTIAL HYSTERECTOMY    . TONSILECTOMY/ADENOIDECTOMY WITH MYRINGOTOMY      OB History    Gravida Para Term Preterm AB Living   0 0 0 0 0     SAB TAB Ectopic Multiple Live Births   0 0 0           Home Medications    Prior to Admission medications   Medication Sig Start Date End Date Taking? Authorizing Provider  azithromycin (ZITHROMAX) 250 MG tablet Take 2 tablets today then 1 tablet daily for 4 days starting tomorrow 12/03/16   Tresa Garter, MD  cholestyramine (QUESTRAN) 4 g packet Take 2 packets (8 g total) by mouth 2 (two) times daily. 12/03/16   Tresa Garter, MD  furosemide (LASIX) 20 MG tablet Take 1 tablet (20 mg total) by mouth 2 (two) times daily. 12/03/16   Tresa Garter, MD  Incontinence Supply Disposable (DEPEND UNDERWEAR X-LARGE) MISC Use as needed 04/09/16   Tresa Garter, MD  lactulose (CHRONULAC) 10 GM/15ML solution Take 15 mLs (10 g total) by mouth 2 (two) times daily as needed for mild constipation. 10/27/16   Truitt Merle, MD  loratadine (CLARITIN) 10 MG tablet Take 1 tablet (10 mg total) by mouth daily. 12/03/16   Tresa Garter, MD  Misc. Devices (ADJUSTABLE COMMODE 3-IN-1) MISC 1 each by Does not apply route as needed. 10/30/15   Tresa Garter, MD  Misc. Devices (TRANSFER BENCH) MISC 1 each by Does not apply route as needed. 10/30/15   Tresa Garter, MD  neomycin-polymyxin-hydrocortisone (CORTISPORIN) otic solution Place 3 drops into the left ear 4 (four) times daily. 09/03/16   Tresa Garter, MD  pantoprazole (PROTONIX) 40 MG tablet Take 1 tablet (40 mg total) by mouth 2 (two) times daily. 12/03/16   Tresa Garter, MD  spironolactone (ALDACTONE) 50 MG tablet Take 1 tablet (50 mg total) by mouth daily. 09/03/16   Tresa Garter, MD  traMADol (ULTRAM) 50  MG tablet Take 1 tablet (50 mg total) by mouth every 8 (eight) hours as needed. 12/03/16   Tresa Garter, MD  vitamin B-12 (CYANOCOBALAMIN) 1000 MCG tablet Take 1 tablet (1,000 mcg total) by mouth daily. 12/03/16   Tresa Garter, MD  Vitamin D, Ergocalciferol, (DRISDOL) 50000 units CAPS capsule Take 1 capsule (50,000 Units total) by mouth every 7 (seven) days. 12/03/16   Tresa Garter, MD    Family History Family History  Problem Relation Age of Onset  . Pulmonary fibrosis Sister   . Kidney disease Sister   . Colon cancer Neg Hx     Social History Social History  Substance Use Topics  . Smoking status: Never Smoker  . Smokeless tobacco: Never Used  . Alcohol use No     Comment: quit 24 years ago     Allergies   Other   Review of Systems Review of Systems  Unable to perform ROS: Patient unresponsive  Physical Exam Updated Vital Signs BP 149/94 (BP Location: Right Arm)   Pulse (!) 56   Temp (!) 90.7 F (32.6 C) (Rectal)   Resp 12   SpO2 100%   Physical Exam  Constitutional: She appears well-developed and well-nourished. She appears toxic. She appears distressed. She is intubated (with equal bilateral breath sounds).  HENT:  Head: Normocephalic and atraumatic.  Right Ear: External ear normal.  Left Ear: External ear normal.  Nose: Nose normal.  Eyes: Conjunctivae and EOM are normal. No scleral icterus.  Pupils fixed and dilated  Neck: Normal range of motion and phonation normal.  Cardiovascular: Normal rate and regular rhythm.   Pulmonary/Chest: No stridor. She is intubated (with equal bilateral breath sounds).  Abdominal: She exhibits no distension.  Musculoskeletal: Normal range of motion. She exhibits no edema.  Neurological: She is unresponsive.  Skin: She is not diaphoretic.  Psychiatric: She has a normal mood and affect. Her behavior is normal.  Vitals reviewed.    ED Treatments / Results  Labs (all labs ordered are listed, but  only abnormal results are displayed) Labs Reviewed  CBC - Abnormal; Notable for the following:       Result Value   WBC 33.4 (*)    RBC 1.74 (*)    Hemoglobin 5.0 (*)    HCT 16.8 (*)    MCHC 29.8 (*)    RDW 20.1 (*)    Platelets 54 (*)    All other components within normal limits  DIFFERENTIAL - Abnormal; Notable for the following:    Neutro Abs 20.1 (*)    Lymphs Abs 10.4 (*)    Monocytes Absolute 2.3 (*)    Basophils Absolute 0.3 (*)    All other components within normal limits  PROTIME-INR - Abnormal; Notable for the following:    Prothrombin Time >90.0 (*)    INR >10.00 (*)    All other components within normal limits  APTT - Abnormal; Notable for the following:    aPTT >200 (*)    All other components within normal limits  COMPREHENSIVE METABOLIC PANEL - Abnormal; Notable for the following:    Potassium 6.3 (*)    CO2 <7 (*)    Glucose, Bld 141 (*)    BUN 24 (*)    Creatinine, Ser 1.84 (*)    Total Protein 3.8 (*)    Albumin 1.0 (*)    AST 223 (*)    ALT 100 (*)    Alkaline Phosphatase 175 (*)    Total Bilirubin 3.3 (*)    GFR calc non Af Amer 26 (*)    GFR calc Af Amer 30 (*)    All other components within normal limits  TROPONIN I - Abnormal; Notable for the following:    Troponin I 0.31 (*)    All other components within normal limits  LIPID PANEL - Abnormal; Notable for the following:    Triglycerides 161 (*)    HDL <10 (*)    All other components within normal limits  I-STAT CG4 LACTIC ACID, ED - Abnormal; Notable for the following:    Lactic Acid, Venous >17.00 (*)    All other components within normal limits  I-STAT TROPOININ, ED - Abnormal; Notable for the following:    Troponin i, poc 0.59 (*)    All other components within normal limits  PATHOLOGIST SMEAR REVIEW  CBG MONITORING, ED    EKG  EKG Interpretation None       Radiology No results  found.  Procedures Procedures (including critical care time)   EMERGENCY DEPARTMENT [place] CARDIAC  EXAM "Study: Limited Ultrasound of the heart and pericardium"  INDICATIONS:Cardiac arrest Multiple views of the heart and pericardium were obtained in real-time with a multi-frequency probe.  PERFORMED [id number]  IMAGES ARCHIVED?: No  FINDINGS: No pericardial effusion and Normal contractility  LIMITATIONS:  Emergent procedure  VIEWS USED: Subcostal 4 chamber  INTERPRETATION: Cardiac activity present and Pericardial effusioin absent  CPT Code: [id number] (limited transthoracic cardiac)   CRITICAL CARE Performed by: Grayce Sessions Cardama Total critical care time: 50 minutes Critical care time was exclusive of separately billable procedures and treating other patients. Critical care was necessary to treat or prevent imminent or life-threatening deterioration. Critical care was time spent personally by me on the following activities: development of treatment plan with patient and/or surrogate as well as nursing, discussions with consultants, evaluation of patient's response to treatment, examination of patient, obtaining history from patient or surrogate, ordering and performing treatments and interventions, ordering and review of laboratory studies, ordering and review of radiographic studies, pulse oximetry and re-evaluation of patient's condition.   Medications Ordered in ED Medications  EPINEPHrine (ADRENALIN) 4 mg in dextrose 5 % 250 mL (0.016 mg/mL) infusion (0 mcg/min Intravenous Stopped February 14, 2017 0725)  0.9 %  sodium chloride infusion (not administered)  0.9 %  sodium chloride infusion (not administered)  aspirin chewable tablet 324 mg (not administered)  aspirin suppository 300 mg (not administered)  sodium chloride 0.9 % bolus 1,000 mL (not administered)  norepinephrine (LEVOPHED) 4 mg in dextrose 5 % 250 mL (0.016 mg/mL) infusion (not administered)     Initial Impression / Assessment and Plan / ED Course  I have reviewed the triage vital signs and the nursing  notes.  Pertinent labs & imaging results that were available during my care of the patient were reviewed by me and considered in my medical decision making (see chart for details).  Clinical Course as of Jan 21 931  2017/02/14 EKG transmitted from EMS concerning for ST segment elevation MI. Code STEMI initiated.  [PC]  [id number] Return of spontaneous circulation obtained upon arrival. Initial blood pressure 160/110s, HR 70s. ET tube placement confirmed with auscultation and chest x-ray.   [PC]  [id number] Cardiologist at bedside evaluating the patient and the case. Felt that this is likely secondary to global ischemia and not STEMI. Given the reported history, I also agree. Code STEMI canceled.  [PC]  0730 CT head obtained to rule out intracranial hemorrhage or subarachnoid hemorrhage. CT head negative. Given the patient's history considered possible TPA for PE however blood pressure elevated. Blood work trickling in and noted a hemoglobin of 5 with platelets of 53.  [PC]  47 Spoke with family who provided the history above and confirmed patient's desire for full code and the fact the patient was Jehovah's Witness. There were made aware of the situation and patient's condition and uncertain prognosis.  [PC]  [id number] Epinephrine initiated for bradycardia and hypotension.  [PC]  0745 Critical care consulted for admission  [PC]  0800 Critical care at bedside and will take over care of the patient  [PC]    Clinical Course User Index [PC] Fatima Blank, MD      Final Clinical Impressions(s) / ED Diagnoses   Final diagnoses:  Cardiac arrest [facility])  Metabolic acidosis  Hyperkalemia  Acute renal failure, unspecified acute renal failure type ([place])  Lactic acidosis      Shellee Milo  Aretha Parrot, MD 2017-02-08 [id number]

## 2017-02-11 NOTE — Code Documentation (Signed)
Pt is maxed out on EPI drip, doing CPR.

## 2017-02-11 NOTE — ED Notes (Signed)
Critical care talking with patient's family.

## 2017-02-11 NOTE — ED Notes (Signed)
Pt arrives via EMS for unwitnessed arrest, last seen alive at 27. Pt was seen by family then went back to bed. Family overheard patient snoring and went to check on her. They found her pulseless and apneic, called 911. Fire department placed her on AED and initiated CPR, got 3 rounds of CPR prior to [place] arriving. CPR continued by [facility] at [place]. Initial rhythm PEA rate of 30. Received CPR for 25 minutes before a ten minute period of ROSC achieved. Pt then received CPR for PEA for another 15 minutes achieving ROSC at 0655 in ED. Pt received 8 rounds of epi, no other ACLS drugs.   Hx stage 4 liver cancer.

## 2017-02-11 NOTE — Progress Notes (Addendum)
ABG attempted x 3- unable to obtain.  RN&  CCM NP aware.  -late entry- per CCM will wait on VBG results to eval vent changes- maintain current settings for now.

## 2017-02-11 NOTE — ED Notes (Signed)
Patients daughter is on the phone with Dr. Burr Medico discussing plan of care.

## 2017-02-11 DEATH — deceased

## 2017-03-26 ENCOUNTER — Other Ambulatory Visit: Payer: Self-pay | Admitting: Nurse Practitioner
